# Patient Record
Sex: Male | Born: 1935 | Race: White | Hispanic: No | Marital: Married | Smoking: Former smoker
Health system: Southern US, Community
[De-identification: ages and names within clinical notes are randomized; demographics above are authoritative.]

## PROBLEM LIST (undated history)

## (undated) DIAGNOSIS — K562 Volvulus: Secondary | ICD-10-CM

## (undated) DIAGNOSIS — I4892 Unspecified atrial flutter: Secondary | ICD-10-CM

## (undated) DIAGNOSIS — Z8719 Personal history of other diseases of the digestive system: Secondary | ICD-10-CM

## (undated) DIAGNOSIS — F0390 Unspecified dementia without behavioral disturbance: Secondary | ICD-10-CM

## (undated) DIAGNOSIS — R159 Full incontinence of feces: Secondary | ICD-10-CM

## (undated) DIAGNOSIS — Z9289 Personal history of other medical treatment: Secondary | ICD-10-CM

## (undated) DIAGNOSIS — L409 Psoriasis, unspecified: Secondary | ICD-10-CM

## (undated) DIAGNOSIS — R32 Unspecified urinary incontinence: Secondary | ICD-10-CM

## (undated) DIAGNOSIS — I2510 Atherosclerotic heart disease of native coronary artery without angina pectoris: Secondary | ICD-10-CM

## (undated) DIAGNOSIS — D649 Anemia, unspecified: Secondary | ICD-10-CM

## (undated) HISTORY — DX: Personal history of other medical treatment: Z92.89

## (undated) HISTORY — PX: CARDIAC CATHETERIZATION: SHX172

## (undated) HISTORY — PX: HERNIA REPAIR: SHX51

## (undated) HISTORY — PX: TONSILLECTOMY: SUR1361

---

## 2004-01-15 ENCOUNTER — Encounter: Payer: Self-pay | Admitting: Cardiology

## 2004-01-15 ENCOUNTER — Ambulatory Visit (HOSPITAL_COMMUNITY): Admission: RE | Admit: 2004-01-15 | Discharge: 2004-01-15 | Payer: Self-pay | Admitting: Family Medicine

## 2008-11-01 ENCOUNTER — Emergency Department (HOSPITAL_COMMUNITY): Admission: EM | Admit: 2008-11-01 | Discharge: 2008-11-01 | Payer: Self-pay | Admitting: Emergency Medicine

## 2009-08-22 HISTORY — PX: COLONOSCOPY: SHX174

## 2010-12-02 LAB — BASIC METABOLIC PANEL
BUN: 26 mg/dL — ABNORMAL HIGH (ref 6–23)
Creatinine, Ser: 1.29 mg/dL (ref 0.4–1.5)
GFR calc Af Amer: >60 mL/min (ref >60)
GFR calc non Af Amer: 55 mL/min — ABNORMAL LOW (ref >60)
Potassium: 3.7 mEq/L (ref 3.5–5.1)

## 2010-12-02 LAB — URINALYSIS, ROUTINE W REFLEX MICROSCOPIC
Glucose, UA: NEGATIVE mg/dL
Ketones, ur: 15 mg/dL — AB
Nitrite: NEGATIVE
pH: 6 (ref 5.0–8.0)

## 2010-12-02 LAB — DIFFERENTIAL
Lymphocytes Relative: 7 % — ABNORMAL LOW (ref 12–46)
Lymphs Abs: 0.5 10*3/uL — ABNORMAL LOW (ref 0.7–4.0)
Monocytes Relative: 4 % (ref 3–12)
Neutrophils Relative %: 88 % — ABNORMAL HIGH (ref 43–77)

## 2010-12-02 LAB — CBC
HCT: 35 % — ABNORMAL LOW (ref 39.0–52.0)
Platelets: 172 10*3/uL (ref 150–400)
RBC: 4.03 MIL/uL — ABNORMAL LOW (ref 4.22–5.81)
WBC: 8.4 10*3/uL (ref 4.0–10.5)

## 2010-12-02 LAB — POCT CARDIAC MARKERS
CKMB, poc: 4.9 ng/mL (ref 1.0–8.0)
Myoglobin, poc: 137 ng/mL (ref 12–200)
Troponin i, poc: <0.05 ng/mL (ref 0.00–0.09)

## 2011-01-04 NOTE — Consult Note (Signed)
NAMEYARNELL, [name] Galloway NO.:  [id number]   MEDICAL RECORD NO.:  [id number]          PATIENT TYPE:  EMS   LOCATION:  [facility]                         FACILITY:  [facility]   PHYSICIAN:  Elmore Guise., M.D.DATE OF BIRTH:  06-Dec-1934   DATE OF CONSULTATION:  11/01/2008  DATE OF DISCHARGE:                                 CONSULTATION   INDICATION FOR CONSULT:  Presyncope, patient with history of aortic  valve stenosis and left bundle branch block.   REQUESTING PHYSICIAN:  Carolan Shiver, resident; Dr. Kathlen Brunswick, attending.   HISTORY OF PRESENT ILLNESS:  [name] Galloway is a very pleasant 73-year-  old white male with past medical history of hypertension, aortic valve  stenosis, left bundle branch block who presents to the emergency room  after a presyncopal spell.  The patient reports a normal state of  health.  He states over last couple weeks he has been noticing off-and-  on dry, nonproductive cough.  He denies any problems.  He stays very  active.  Today he was out working and after bending over and then  standing up became very dizzy.  He denied any palpitations.  No chest  pain or shortness of breath.  He did become a little clammy.  He had a  brief episode of nausea and this resolved spontaneously.  The patient  first went to Urgent Care.  There he checked out fine; however, was sent  to the emergency room for further evaluation.  Here he was given 1 L of  normal saline and fell back to normal.  He states he stays very active  with work and at home doing work around the house.  He does not have any  exertional chest pain or shortness of breath.  No palpitations.  He had  a similar episode approximately 1 year ago; at that time it was thought  to be secondary to his hydrochlorothiazide and this medication was  stopped.  Since arriving in the emergency room his blood pressures  ranged from 110-120/60-70 and his heart rates have been in the 60s and  70s showing a left  bundle branch block on the telemetry monitor.  His  cardiac markers were normal.  He is feeling much better and would like  to go home.   REVIEW OF SYSTEMS:  Are as per HPI.  All others are negative.   CURRENT MEDICATIONS:  Are lisinopril and Pravachol.   ALLERGIES:  CIPRO.   FAMILY HISTORY:  Positive for heart disease and cancer with his mother.   SOCIAL HISTORY:  He is married.  Denies any tobacco or alcohol, stays  very active with work and house activities.   PHYSICAL EXAMINATION:  He is afebrile, heart rate 60-70, blood pressure  is 110-120/60-70 satting 95% on room air.  GENERAL:  He is very pleasant white male alert and oriented x4 in no  acute distress.  He does appear younger than stated age.  HEENT:  Normal.  NECK:  Supple, no lymphadenopathy, no JVD, he does have radiation of his  AS murmur to both carotids.  Carotid upstrokes are normal.  No  thyromegaly.  LUNGS:  Clear.  HEART:  Regular with 3/6 systolic ejection murmur, PMI is not displaced.  ABDOMEN:  Soft, nontender, nondistended, no rebound or guarding.  EXTREMITIES:  Warm with 2+ pulses and no edema.  SKIN:  Warm and dry.  Strength is 5/5 and equal bilaterally.  No focal deficits noted.   His blood work shows a sodium of 134, potassium of 3.7, BUN and  creatinine of 26 and 1.29.  White blood cell count of 8.4, hemoglobin of  11.9, platelet count of 172.  Cardiac markers are negative with a  myoglobin of 137, troponin of less than 0.05 and MB of 4.9.  EKG shows  normal sinus rhythm, 65 per minute with left bundle branch block noted.  Chest x-ray shows no acute cardiopulmonary disease.   IMPRESSION:  1. Presyncope, likely secondary to decreased preload and afterload      reduction from his angiotension-converting enzyme inhibitor.  2. History of aortic valve stenosis.  3. History of left bundle branch block.  4. Hypertension.   PLAN:  1. At this time I do think the patient is stable to be discharged       home.  He is feeling totally back to normal.  I have asked him to      increase his fluid intake.  He will stop his lisinopril.  Per his      history, his last echo with Dr. Eldridge Dace was approximately 1 year      ago.  He will need follow-up with Dr. Eldridge Dace on Monday with an      echo done at that time.  If his symptoms return he is to give [place] a      call for further recommendations.  I did discuss if he has      progression of his aortic stenosis, then he may need workup for      valve replacement.      Elmore Guise., M.D.  Electronically Signed     TWK/MEDQ  D:  11/01/2008  T:  11/01/2008  Job:  [id number]   cc:   Corky Crafts, MD

## 2011-09-19 ENCOUNTER — Emergency Department (HOSPITAL_COMMUNITY): Payer: Medicare Other

## 2011-09-19 ENCOUNTER — Inpatient Hospital Stay (HOSPITAL_COMMUNITY)
Admission: EM | Admit: 2011-09-19 | Discharge: 2011-09-26 | DRG: 390 | Disposition: A | Discharge status: Home / Self Care | Payer: Medicare Other | Source: Ambulatory Visit | Attending: Internal Medicine | Admitting: Internal Medicine

## 2011-09-19 ENCOUNTER — Encounter (HOSPITAL_COMMUNITY): Payer: Self-pay | Admitting: Emergency Medicine

## 2011-09-19 DIAGNOSIS — K562 Volvulus: Principal | ICD-10-CM | POA: Diagnosis present

## 2011-09-19 DIAGNOSIS — K639 Disease of intestine, unspecified: Secondary | ICD-10-CM

## 2011-09-19 DIAGNOSIS — Z888 Allergy status to other drugs, medicaments and biological substances status: Secondary | ICD-10-CM

## 2011-09-19 DIAGNOSIS — R112 Nausea with vomiting, unspecified: Secondary | ICD-10-CM | POA: Diagnosis present

## 2011-09-19 DIAGNOSIS — D649 Anemia, unspecified: Secondary | ICD-10-CM | POA: Diagnosis present

## 2011-09-19 DIAGNOSIS — K56609 Unspecified intestinal obstruction, unspecified as to partial versus complete obstruction: Secondary | ICD-10-CM | POA: Diagnosis present

## 2011-09-19 DIAGNOSIS — K5900 Constipation, unspecified: Secondary | ICD-10-CM | POA: Diagnosis present

## 2011-09-19 DIAGNOSIS — I10 Essential (primary) hypertension: Secondary | ICD-10-CM | POA: Diagnosis present

## 2011-09-19 DIAGNOSIS — Z79899 Other long term (current) drug therapy: Secondary | ICD-10-CM

## 2011-09-19 DIAGNOSIS — E876 Hypokalemia: Secondary | ICD-10-CM | POA: Diagnosis present

## 2011-09-19 DIAGNOSIS — I359 Nonrheumatic aortic valve disorder, unspecified: Secondary | ICD-10-CM | POA: Diagnosis present

## 2011-09-19 DIAGNOSIS — I350 Nonrheumatic aortic (valve) stenosis: Secondary | ICD-10-CM

## 2011-09-19 DIAGNOSIS — K449 Diaphragmatic hernia without obstruction or gangrene: Secondary | ICD-10-CM | POA: Diagnosis present

## 2011-09-19 DIAGNOSIS — E785 Hyperlipidemia, unspecified: Secondary | ICD-10-CM | POA: Diagnosis present

## 2011-09-19 DIAGNOSIS — Z809 Family history of malignant neoplasm, unspecified: Secondary | ICD-10-CM

## 2011-09-19 DIAGNOSIS — R911 Solitary pulmonary nodule: Secondary | ICD-10-CM | POA: Diagnosis present

## 2011-09-19 DIAGNOSIS — E860 Dehydration: Secondary | ICD-10-CM | POA: Diagnosis present

## 2011-09-19 DIAGNOSIS — R918 Other nonspecific abnormal finding of lung field: Secondary | ICD-10-CM | POA: Diagnosis present

## 2011-09-19 DIAGNOSIS — Z7982 Long term (current) use of aspirin: Secondary | ICD-10-CM

## 2011-09-19 DIAGNOSIS — Z87891 Personal history of nicotine dependence: Secondary | ICD-10-CM

## 2011-09-19 LAB — CBC
HCT: 34.2 % — ABNORMAL LOW (ref 39.0–52.0)
MCHC: 35.1 g/dL (ref 30.0–36.0)
Platelets: 251 10*3/uL (ref 150–400)
Platelets: 206 10*3/uL (ref 150–400)
RDW: 12.8 % (ref 11.5–15.5)
RDW: 12.9 % (ref 11.5–15.5)
WBC: 9.8 10*3/uL (ref 4.0–10.5)
WBC: 9.1 10*3/uL (ref 4.0–10.5)

## 2011-09-19 LAB — COMPREHENSIVE METABOLIC PANEL
ALT: 24 U/L (ref 0–53)
AST: 28 U/L (ref 0–37)
CO2: 30 mEq/L (ref 19–32)
Calcium: 9.1 mg/dL (ref 8.4–10.5)
Chloride: 95 mEq/L — ABNORMAL LOW (ref 96–112)
GFR calc non Af Amer: 66 mL/min — ABNORMAL LOW (ref >90)
Potassium: 2.2 mEq/L — CL (ref 3.5–5.1)
Sodium: 139 mEq/L (ref 135–145)

## 2011-09-19 LAB — DIFFERENTIAL
Basophils Absolute: 0 10*3/uL (ref 0.0–0.1)
Eosinophils Relative: 0 % (ref 0–5)
Lymphocytes Relative: 8 % — ABNORMAL LOW (ref 12–46)
Neutro Abs: 8.5 10*3/uL — ABNORMAL HIGH (ref 1.7–7.7)
Neutrophils Relative %: 87 % — ABNORMAL HIGH (ref 43–77)

## 2011-09-19 LAB — LACTIC ACID, PLASMA: Lactic Acid, Venous: 2.2 mmol/L (ref 0.5–2.2)

## 2011-09-19 LAB — LIPASE, BLOOD: Lipase: 29 U/L (ref 11–59)

## 2011-09-19 LAB — CREATININE, SERUM
Creatinine, Ser: 1.12 mg/dL (ref 0.50–1.35)
GFR calc Af Amer: 73 mL/min — ABNORMAL LOW (ref >90)
GFR calc non Af Amer: 63 mL/min — ABNORMAL LOW (ref >90)

## 2011-09-19 MED ORDER — POTASSIUM CHLORIDE 10 MEQ/100ML IV SOLN
10.0000 meq | Freq: Once | INTRAVENOUS | Status: AC
  Administered 2011-09-19: 10 meq via INTRAVENOUS
  Filled 2011-09-19: qty 100

## 2011-09-19 MED ORDER — POTASSIUM CHLORIDE 20 MEQ PO PACK: 40.0000 meq | PACK | Freq: Once | ORAL | Status: DC

## 2011-09-19 MED ORDER — POTASSIUM CHLORIDE CRYS ER 20 MEQ PO TBCR
40.0000 meq | EXTENDED_RELEASE_TABLET | Freq: Once | ORAL | Status: DC
  Filled 2011-09-19: qty 2

## 2011-09-19 MED ORDER — LORAZEPAM 2 MG/ML IJ SOLN
0.5000 mg | Freq: Once | INTRAMUSCULAR | Status: AC
  Administered 2011-09-19: 0.5 mg via INTRAVENOUS
  Filled 2011-09-19: qty 1

## 2011-09-19 MED ORDER — SODIUM CHLORIDE 0.9 % IV SOLN
999.0000 mL | Freq: Once | INTRAVENOUS | Status: AC
  Administered 2011-09-19: 999 mL via INTRAVENOUS

## 2011-09-19 MED ORDER — IOHEXOL 300 MG/ML  SOLN
100.0000 mL | Freq: Once | INTRAMUSCULAR | Status: AC | PRN
  Administered 2011-09-19: 100 mL via INTRAVENOUS

## 2011-09-19 MED ORDER — ONDANSETRON HCL 4 MG/2ML IJ SOLN
4.0000 mg | Freq: Once | INTRAMUSCULAR | Status: AC
  Administered 2011-09-19: 4 mg via INTRAVENOUS
  Filled 2011-09-19: qty 2

## 2011-09-19 MED ORDER — KCL IN DEXTROSE-NACL 40-5-0.45 MEQ/L-%-% IV SOLN
INTRAVENOUS | Status: DC
  Administered 2011-09-19 – 2011-09-20 (×2): via INTRAVENOUS
  Filled 2011-09-19 (×5): qty 1000

## 2011-09-19 MED ORDER — POTASSIUM CHLORIDE 10 MEQ/100ML IV SOLN
10.0000 meq | INTRAVENOUS | Status: AC
  Administered 2011-09-19 (×2): 10 meq via INTRAVENOUS
  Filled 2011-09-19 (×2): qty 100

## 2011-09-19 MED ORDER — HYDROMORPHONE HCL PF 1 MG/ML IJ SOLN: 1.0000 mg | INTRAMUSCULAR | Status: DC | PRN

## 2011-09-19 MED ORDER — SODIUM CHLORIDE 0.9 % IJ SOLN
3.0000 mL | Freq: Two times a day (BID) | INTRAMUSCULAR | Status: DC
  Administered 2011-09-20 – 2011-09-26 (×6): 3 mL via INTRAVENOUS

## 2011-09-19 MED ORDER — SODIUM CHLORIDE 0.9 % IV SOLN: INTRAVENOUS | Status: DC

## 2011-09-19 MED ORDER — IOHEXOL 300 MG/ML  SOLN
20.0000 mL | INTRAMUSCULAR | Status: AC
  Administered 2011-09-19: 20 mL via ORAL

## 2011-09-19 MED ORDER — PNEUMOCOCCAL VAC POLYVALENT 25 MCG/0.5ML IJ INJ
0.5000 mL | INJECTION | INTRAMUSCULAR | Status: AC
  Filled 2011-09-19: qty 0.5

## 2011-09-19 MED ORDER — ENOXAPARIN SODIUM 40 MG/0.4ML ~~LOC~~ SOLN
40.0000 mg | SUBCUTANEOUS | Status: DC
  Administered 2011-09-19 – 2011-09-25 (×7): 40 mg via SUBCUTANEOUS
  Filled 2011-09-19 (×9): qty 0.4

## 2011-09-19 NOTE — H&P (Signed)
[name] Galloway is an 77 y.o. male.   Chief Complaint: Abdominal pain, Nausea Vomiting HPI: 77 yo man with history of HTN and Hyperlipidemia presenting with 3 weeks of abdominal pain, nausea and vomiting and one week of progressive Constipation. Has had Colonoscopy last October which was reportedly fine.  His abdominal pain is 10/10, distended, all over and not relieved by anything. Denies any fever, No Melena, no BRBPR. He has tried many laxatives but did not work.  Past Medical History  Diagnosis Date  . High cholesterol   . Hypertension     History reviewed. No pertinent past surgical history.  History reviewed. No pertinent family history. Social History:  reports that he has never smoked. He does not have any smokeless tobacco history on file. He reports that he does not drink alcohol. His drug history not on file.  Allergies:  Allergies  Allergen Reactions  . Ciprofloxacin     Medications Prior to Admission  Medication Dose Route Frequency Provider Last Rate Last Dose  . 0.9 %  sodium chloride infusion  999 mL Intravenous Once Gerhard Munch, MD 250 mL/hr at 09/19/11 1441 999 mL at 09/19/11 1441  . 0.9 %  sodium chloride infusion   Intravenous STAT Geoffery Lyons, MD      . iohexol (OMNIPAQUE) 300 MG/ML solution 100 mL  100 mL Intravenous Once PRN Medication Radiologist, MD   100 mL at 09/19/11 1651  . iohexol (OMNIPAQUE) 300 MG/ML solution 20 mL  20 mL Oral Q1 Hr x 2 Medication Radiologist, MD   20 mL at 09/19/11 1455  . LORazepam (ATIVAN) injection 0.5 mg  0.5 mg Intravenous Once Gerhard Munch, MD   0.5 mg at 09/19/11 1443  . ondansetron (ZOFRAN) injection 4 mg  4 mg Intravenous Once Gerhard Munch, MD   4 mg at 09/19/11 1443  . potassium chloride 10 mEq in 100 mL IVPB  10 mEq Intravenous Q1 Hr x 2 Gerhard Munch, MD   10 mEq at 09/19/11 1800  . potassium chloride 10 mEq in 100 mL IVPB  10 mEq Intravenous Once Grant Fontana, [place]   10 mEq at 09/19/11 1920  . DISCONTD:  potassium chloride (KLOR-CON) packet 40 mEq  40 mEq Oral Once Gerhard Munch, MD      . DISCONTD: potassium chloride SA (K-DUR,KLOR-CON) CR tablet 40 mEq  40 mEq Oral Once Gerhard Munch, MD       No current outpatient prescriptions on file as of 09/19/2011.    Results for orders placed during the hospital encounter of 09/19/11 (from the past 48 hour(s))  CBC     Status: Abnormal   Collection Time   09/19/11  2:16 PM      Component Value Range Comment   WBC 9.8  4.0 - 10.5 (K/uL)    RBC 4.40  4.22 - 5.81 (MIL/uL)    Hemoglobin 12.9 (*) 13.0 - 17.0 (g/dL)    HCT 19.1 (*) 47.8 - 52.0 (%)    MCV 83.2  78.0 - 100.0 (fL)    MCH 29.3  26.0 - 34.0 (pg)    MCHC 35.2  30.0 - 36.0 (g/dL)    RDW 29.5  62.1 - 30.8 (%)    Platelets 251  150 - 400 (K/uL)   DIFFERENTIAL     Status: Abnormal   Collection Time   09/19/11  2:16 PM      Component Value Range Comment   Neutrophils Relative 87 (*) 43 - 77 (%)    Neutro  Abs 8.5 (*) 1.7 - 7.7 (K/uL)    Lymphocytes Relative 8 (*) 12 - 46 (%)    Lymphs Abs 0.7  0.7 - 4.0 (K/uL)    Monocytes Relative 6  3 - 12 (%)    Monocytes Absolute 0.6  0.1 - 1.0 (K/uL)    Eosinophils Relative 0  0 - 5 (%)    Eosinophils Absolute 0.0  0.0 - 0.7 (K/uL)    Basophils Relative 0  0 - 1 (%)    Basophils Absolute 0.0  0.0 - 0.1 (K/uL)   COMPREHENSIVE METABOLIC PANEL     Status: Abnormal   Collection Time   09/19/11  2:16 PM      Component Value Range Comment   Sodium 139  135 - 145 (mEq/L)    Potassium 2.2 (*) 3.5 - 5.1 (mEq/L)    Chloride 95 (*) 96 - 112 (mEq/L)    CO2 30  19 - 32 (mEq/L)    Glucose, Bld 137 (*) 70 - 99 (mg/dL)    BUN 43 (*) 6 - 23 (mg/dL)    Creatinine, Ser 4.09  0.50 - 1.35 (mg/dL)    Calcium 9.1  8.4 - 10.5 (mg/dL)    Total Protein 7.2  6.0 - 8.3 (g/dL)    Albumin 3.9  3.5 - 5.2 (g/dL)    AST 28  0 - 37 (U/L)    ALT 24  0 - 53 (U/L)    Alkaline Phosphatase 53  39 - 117 (U/L)    Total Bilirubin 0.7  0.3 - 1.2 (mg/dL)    GFR calc non Af Amer  66 (*) >90 (mL/min)    GFR calc Af Amer 76 (*) >90 (mL/min)   LIPASE, BLOOD     Status: Normal   Collection Time   09/19/11  2:16 PM      Component Value Range Comment   Lipase 29  11 - 59 (U/L)   LACTIC ACID, PLASMA     Status: Normal   Collection Time   09/19/11  2:16 PM      Component Value Range Comment   Lactic Acid, Venous 2.2  0.5 - 2.2 (mmol/L)    Ct Abdomen Pelvis W Contrast  09/19/2011  *RADIOLOGY REPORT*  Clinical Data: 3-week history of abdominal pain, decreased bowel movements, anorexia, and nausea/vomiting.  CT ABDOMEN AND PELVIS WITH CONTRAST/20 03/2012:  Technique:  Multidetector CT imaging of the abdomen and pelvis was performed following the standard protocol during bolus administration of intravenous contrast.  Contrast: 100 ml Omnipaque 300 IV.  Oral contrast also administered.  Comparison: Acute abdomen series 09/05/2011.  Findings: Interval marked worsening of the gaseous distention of the colon; the colon is now markedly distended to the level of the proximal sigmoid colon where there is an abrupt transition to decompressed colon.  No visible mass involving the sigmoid colon at the transition. Maximal colonic diameter 9.3 cm in the cecum. Large amount of liquid stool throughout the colon.  Inspissated stool in the cecum and ascending colon.  No evidence of small bowel distention.  Moderate sized hiatal hernia.  Thickening involving the wall of the visualized distal esophagus.  Stomach decompressed and otherwise unremarkable.  No free intraperitoneal air.  Beam hardening streak artifact as the patient was unable to raise the arms.  Allowing for this, no focal abnormality involving the liver, spleen, or pancreas.  Gallbladder decompressed and unremarkable by CT.  No biliary ductal dilation. Mild diffuse cortical thinning involving both kidneys which are  otherwise unremarkable.  Normal adrenal glands.  Aorto-iliofemoral atherosclerosis without aneurysm.  Patent visceral arteries.  No  significant lymphadenopathy.  Wide-mouth diverticulum involving the left posterolateral wall of the urinary bladder.  Moderate to severe prostate gland enlargement, particularly the median lobe.  Numerous pelvic phleboliths.  Bone window images demonstrate degenerative changes involving the lower thoracic and lumbar spine.  2 nodules in the visualized right lower lobe, the largest approximating 9 mm (series 3, image 3).  Heart mildly enlarged with left for ventricular predominance and extensive mitral annular and aortic valvular calcification. Three-vessel coronary artery calcification.  IMPRESSION:  1.  Marked gaseous distention of the colon to the level of the proximal sigmoid colon, where there is an abrupt transition to decompressed colon.  No visible mass at the site of transition. 2.  No evidence of small bowel obstruction or free intraperitoneal air.  3.  Moderate sized hiatal hernia.  Thickening of the wall of the visualized distal esophagus suggests GE reflux disease. 4.  Moderate to severe prostate gland enlargement. 5.  Diverticulum arising from the left posterolateral wall of the urinary bladder. 6.  Diffuse cortical thinning involving both kidneys. 7.  2 nodules in the visualized right lower lobe, the largest approximating 9 mm.  Non-emergent CT of the entire chest with contrast is suggested to further evaluation. 8.  Cardiomegaly.  Extensive mitral annular and aortic valvular calcification.  Three-vessel coronary artery calcification.  Original Report Authenticated By: Arnell Sieving, M.D.    Review of Systems  Constitutional: Negative.   HENT: Negative.   Eyes: Negative.   Respiratory: Negative.   Cardiovascular: Negative.   Gastrointestinal: Positive for heartburn, nausea, vomiting, abdominal pain and constipation. Negative for diarrhea, blood in stool and melena.  Genitourinary: Negative.   Musculoskeletal: Negative.   Skin: Negative.   Neurological: Negative.     Endo/Heme/Allergies: Negative.   Psychiatric/Behavioral: Negative.     Blood pressure 129/80, pulse 60, temperature 97.7 F (36.5 C), temperature source Oral, resp. rate 18, SpO2 100.00%. Physical Exam  Constitutional: He is oriented to person, place, and time. He appears well-developed and well-nourished.  HENT:  Head: Normocephalic and atraumatic.  Right Ear: External ear normal.  Left Ear: External ear normal.  Nose: Nose normal.  Mouth/Throat: Oropharynx is clear and moist.  Eyes: Conjunctivae and EOM are normal. Pupils are equal, round, and reactive to light.  Neck: Normal range of motion. Neck supple.  Cardiovascular: Normal rate, regular rhythm, normal heart sounds and intact distal pulses.   Respiratory: Effort normal and breath sounds normal.  GI: He exhibits distension. He exhibits no mass. There is tenderness. There is no rebound and no guarding.  Musculoskeletal: Normal range of motion.  Neurological: He is alert and oriented to person, place, and time. He has normal reflexes.  Skin: Skin is warm and dry.  Psychiatric: He has a normal mood and affect. His behavior is normal. Judgment and thought content normal.     Assessment/Plan 1. Severe Hypokalemia: Probably due to poor intake. Will aggressively replace and check magnesium level. 2. Large Bowel Obstruction: Cause unclear but could be due to profound Hypokalemia, Volvolux, mechanical obstruction etc. [facility] has been consulted and will evaluate for possible decompression. Will replete potassium and keep NPO, hydrate and follow KUBs. 3. Constipation: probably functional obstruction. Address as in 2. 4. Anemia: Check stool guiacs. Follow H/H 5. HTN: Continue home medications    Deniese Oberry,LAWAL 09/19/2011, 7:48 PM

## 2011-09-19 NOTE — ED Notes (Signed)
edp at bedside updating pt on plan of care

## 2011-09-19 NOTE — ED Provider Notes (Signed)
I evaluated patient along with Dr. Jeraldine Loots and Grant Fontana.  The CT showed distended colon and K+ was 2.2.  I spoke with Dr. Matthias Hughs from [facility] who wants patient admitted to medicine.  I spoke with Dr. Mikeal Hawthorne who agrees to admit the patient.    Geoffery Lyons, MD 09/19/11 [id number]

## 2011-09-19 NOTE — ED Notes (Signed)
No bm  X 3 weeks was seen in er 09/04/11 in [place] had xrays then was seen in dr office has been on miralax now cannot eat or drink  abd swollen

## 2011-09-19 NOTE — Consult Note (Signed)
Subjective:   HPI  77 year old male who was admitted with abdominal pain and constipation. and, distention. He states that he has been constipated for two weeks. He has been to urgent care, his PCP and [facility] ER in the past two weeks with this problem. He has not had much BM in the past two weeks and his abdomen has been distended but getting more so lately. He has had some occasional vomiting also. In the ER he was found to be hypokalemic with a high BUN and creatinine suggesting dehydration. A CT of the abdomen shows colonic distention with a cut off at the sigmoid. Patient states that the distention now is about the same as it has been over the past few days. He had a colonoscopy last year and a few years before that    Component Value Date/Time   WBC 9.8 09/19/2011 1416   HGB 12.9* 09/19/2011 1416   HCT 36.6* 09/19/2011 1416   PLT 251 09/19/2011 1416   ALT 24 09/19/2011 1416   AST 28 09/19/2011 1416   NA 139 09/19/2011 1416   K 2.2* 09/19/2011 1416   CL 95* 09/19/2011 1416   CREATININE 1.08 09/19/2011 1416   BUN 43* 09/19/2011 1416   CO2 30 09/19/2011 1416   CALCIUM 9.1 09/19/2011 1416   ALKPHOS 53 09/19/2011 1416      and it was reported that he had a tortuous colon, diverticulosis and internal hemoroids.  Review of Systems No chest pain or dyspnea  Past Medical History  Diagnosis Date  . High cholesterol   . Hypertension    History reviewed. No pertinent past surgical history. History   Social History  . Marital Status: Married    Spouse Name: N/A    Number of Children: N/A  . Years of Education: N/A   Occupational History  . Not on file.   Social History Main Topics  . Smoking status: Never Smoker   . Smokeless tobacco: Not on file  . Alcohol Use: No  . Drug Use:   . Sexually Active:    Other Topics Concern  . Not on file   Social History Narrative  . No narrative on file   family history is not on file. Current facility-administered medications:0.9 %   sodium chloride infusion, 999 mL, Intravenous, Once, Gerhard Munch, MD, Last Rate: 250 mL/hr at 09/19/11 1441, 999 mL at 09/19/11 1441;  0.9 %  sodium chloride infusion, , Intravenous, STAT, Geoffery Lyons, MD;  dextrose 5 % and 0.45 % NaCl with KCl 40 mEq/L infusion, , Intravenous, Continuous, Lonia Blood, MD;  enoxaparin (LOVENOX) injection 40 mg, 40 mg, Subcutaneous, Q24H, Lonia Blood, MD HYDROmorphone (DILAUDID) injection 1 mg, 1 mg, Intravenous, Q4H PRN, Lonia Blood, MD;  iohexol (OMNIPAQUE) 300 MG/ML solution 100 mL, 100 mL, Intravenous, Once PRN, Medication Radiologist, MD, 100 mL at 09/19/11 1651;  iohexol (OMNIPAQUE) 300 MG/ML solution 20 mL, 20 mL, Oral, Q1 Hr x 2, Medication Radiologist, MD, 20 mL at 09/19/11 1455;  LORazepam (ATIVAN) injection 0.5 mg, 0.5 mg, Intravenous, Once, Gerhard Munch, MD, 0.5 mg at 09/19/11 1443 ondansetron [facility]) injection 4 mg, 4 mg, Intravenous, Once, Gerhard Munch, MD, 4 mg at 09/19/11 1443;  potassium chloride 10 mEq in 100 mL IVPB, 10 mEq, Intravenous, Q1 Hr x 2, Gerhard Munch, MD, 10 mEq at 09/19/11 1800;  potassium chloride 10 mEq in 100 mL IVPB, 10 mEq, Intravenous, Once, Grant Fontana, PA, 10 mEq at 09/19/11 1920;  sodium chloride 0.9 %  injection 3 mL, 3 mL, Intravenous, Q12H, Lonia Blood, MD DISCONTD: potassium chloride (KLOR-CON) packet 40 mEq, 40 mEq, Oral, Once, Gerhard Munch, MD;  DISCONTD: potassium chloride SA (K-DUR,KLOR-CON) CR tablet 40 mEq, 40 mEq, Oral, Once, Gerhard Munch, MD Allergies  Allergen Reactions  . Ciprofloxacin      Objective:     BP 129/80  Pulse 60  Temp(Src) 97.7 F (36.5 C) (Oral)  Resp 18  SpO2 100%  General: He is in no distress Heart: RRR Lungs: clear Abdomen: distended, tympanitic, non tender  Laboratory No components found with this basename: d1      Assessment:     Abdominal distention Constipation Colonic obstruction, rule out lesion at sigmoid vs chronic volvulus vs colonic ileus.       Plan:     Correct potassium, hydrate. We will plan a sigmoidoscopy in the morning to further evaluate and decompress. I do not believe this is something that needs to be done emergently tonight.

## 2011-09-19 NOTE — ED Notes (Signed)
Pt sts he was seen at other hospital on 09/05/11 and had 2 enemas and abd xrays with some small results from the enemas. Presents today with a distended abd and some nausea from no BM in 2 weeks. Pt reports some vomiting but not a lot.

## 2011-09-19 NOTE — ED Notes (Signed)
Patient transported to CT 

## 2011-09-19 NOTE — ED Notes (Signed)
Family member states pt is acting confused. Pt presently a&ox4. Awaiting ct scan.

## 2011-09-19 NOTE — ED Provider Notes (Signed)
5:25 PM I assumed care of the patient in the CDU from Dr. Jeraldine Loots, as a shared visit. Pt has had abd pain x 3 weeks; states he has been unable to have a bowel movement for the past week. Labs remarkable for K of 2.2. He does not have a white count and is afebrile with stable VS. On exam, he has generalized abd tenderness with distension. His CT shows marked distension of the large bowel to the level of the sigmoid with no evidence for volvulus or mass at the area of decompression. I discussed this with Dr. Judd Lien. Plan to consult GI with likely medicine admit. Will replete K.  7:44 PM Pt to be admitted to [facility] for further eval and tx.  Grant Fontana, [place] 09/19/11 2036

## 2011-09-19 NOTE — ED Notes (Signed)
Dr. Jeraldine Loots was made aware of patient's low K level=2.2

## 2011-09-19 NOTE — ED Provider Notes (Signed)
History     CSN: [id number]  Arrival date & time 09/19/11  1228   First MD Initiated Contact with Patient 09/19/11 1358      Chief Complaint  Patient presents with  . Abdominal Pain    (Consider location/radiation/quality/duration/timing/severity/associated sxs/prior treatment) HPI This patient presents with 3 weeks of abdominal pain, decreased bowel movements.  He notes that prior to the onset of symptoms he had been in his usual state of health.  No history of abdominal surgery.  His symptoms began gradually, since onset has been persistent, worsening.  No clear provoking factors.  He notes that over the past week in particular he has had no stool production, no flatus.  He notes anorexia, with mild intermittent emesis.  He has been seen in 2 other clinical settings, with prior x-rays, and labs; no specific diagnosis the BuSpar according to the patient and his wife.  He notes that he has been unable to try anything, including MiraLAX, enemas with success. Past Medical History  Diagnosis Date  . High cholesterol   . Hypertension     History reviewed. No pertinent past surgical history.  No family history on file.  History  Substance Use Topics  . Smoking status: Never Smoker   . Smokeless tobacco: Not on file  . Alcohol Use: No      Review of Systems  Constitutional:       Per HPI, otherwise negative  HENT:       Per HPI, otherwise negative  Eyes: Negative.   Respiratory:       Per HPI, otherwise negative  Cardiovascular:       Per HPI, otherwise negative  Gastrointestinal: Positive for vomiting, abdominal pain and constipation. Negative for blood in stool and anal bleeding.  Genitourinary: Negative.   Musculoskeletal:       Per HPI, otherwise negative  Skin: Negative.   Neurological: Negative for syncope.    Allergies  Ciprofloxacin  Home Medications   Current Outpatient Rx  Name Route Sig Dispense Refill  . ASPIRIN 81 MG PO TABS Oral Take 81 mg by mouth  daily.    . OMEGA-3 FATTY ACIDS 1000 MG PO CAPS Oral Take 1 g by mouth daily.    Marland Kitchen LISINOPRIL-HYDROCHLOROTHIAZIDE 10-12.5 MG PO TABS Oral Take 1 tablet by mouth daily.    . LUBIPROSTONE 24 MCG PO CAPS Oral Take 24 mcg by mouth 2 (two) times daily with a meal.    . POLYETHYLENE GLYCOL 3350 PO PACK Oral Take 17 g by mouth daily.      BP 129/80  Pulse 60  Temp(Src) 97.7 F (36.5 C) (Oral)  Resp 18  SpO2 100%  Physical Exam  Nursing note and vitals reviewed. Constitutional: He is oriented to person, place, and time. He appears well-developed. No distress.  HENT:  Head: Normocephalic and atraumatic.  Eyes: Conjunctivae and EOM are normal.  Cardiovascular: Normal rate and regular rhythm.   Pulmonary/Chest: Effort normal. No stridor. No respiratory distress.  Abdominal: He exhibits distension. There is tenderness. There is no guarding.  Musculoskeletal: He exhibits no edema.  Neurological: He is alert and oriented to person, place, and time.  Skin: Skin is warm and dry.  Psychiatric: He has a normal mood and affect.    ED Course  Procedures (including critical care time)  Labs Reviewed  CBC - Abnormal; Notable for the following:    Hemoglobin 12.9 (*)    HCT 36.6 (*)    All other components within normal  limits  DIFFERENTIAL - Abnormal; Notable for the following:    Neutrophils Relative 87 (*)    Neutro Abs 8.5 (*)    Lymphocytes Relative 8 (*)    All other components within normal limits  COMPREHENSIVE METABOLIC PANEL - Abnormal; Notable for the following:    Chloride 95 (*)    Glucose, Bld 137 (*)    BUN 43 (*)    GFR calc non Af Amer 66 (*)    GFR calc Af Amer 76 (*)    All other components within normal limits  LIPASE, BLOOD  LACTIC ACID, PLASMA   No results found.   No diagnosis found.    MDM  This elderly male now presents with 3 weeks of increasing abdominal discomfort, decreased bowel movements, and general fatigue.  On exam the patient is uncomfortable  appearing with a tender, distended abdomen.  The absence of prior abdominal surgeries is somewhat reassuring, but given his description of symptoms, there is concern for obstruction.  The patient's initial evaluation was notable for hypokalemia.  He received supplements, both intravenously, and orally.  He was transferred to the clinical decision unit awaiting his CAT scan       Gerhard Munch, MD 09/19/11 [phone]

## 2011-09-19 NOTE — ED Notes (Signed)
[id number] Ready 

## 2011-09-20 ENCOUNTER — Inpatient Hospital Stay (HOSPITAL_COMMUNITY): Payer: Medicare Other

## 2011-09-20 ENCOUNTER — Encounter (HOSPITAL_COMMUNITY): Payer: Self-pay

## 2011-09-20 ENCOUNTER — Encounter (HOSPITAL_COMMUNITY)
Admission: EM | Disposition: A | Discharge status: Home / Self Care | Payer: Self-pay | Source: Ambulatory Visit | Attending: Internal Medicine

## 2011-09-20 DIAGNOSIS — I350 Nonrheumatic aortic (valve) stenosis: Secondary | ICD-10-CM

## 2011-09-20 DIAGNOSIS — K562 Volvulus: Secondary | ICD-10-CM

## 2011-09-20 HISTORY — PX: FLEXIBLE SIGMOIDOSCOPY: SHX5431

## 2011-09-20 LAB — GLUCOSE, CAPILLARY: Glucose-Capillary: 139 mg/dL — ABNORMAL HIGH (ref 70–99)

## 2011-09-20 LAB — BASIC METABOLIC PANEL
CO2: 31 mEq/L (ref 19–32)
Calcium: 8.4 mg/dL (ref 8.4–10.5)
Chloride: 100 mEq/L (ref 96–112)
GFR calc Af Amer: 71 mL/min — ABNORMAL LOW (ref >90)
Sodium: 140 mEq/L (ref 135–145)

## 2011-09-20 SURGERY — SIGMOIDOSCOPY, FLEXIBLE
Anesthesia: Moderate Sedation

## 2011-09-20 MED ORDER — POLYETHYLENE GLYCOL 3350 17 G PO PACK: 17.0000 g | PACK | Freq: Two times a day (BID) | ORAL | Status: DC

## 2011-09-20 MED ORDER — POLYETHYLENE GLYCOL 3350 17 G PO PACK
17.0000 g | PACK | ORAL | Status: DC
  Administered 2011-09-20 (×2): 17 g via ORAL
  Filled 2011-09-20 (×4): qty 1

## 2011-09-20 MED ORDER — MIDAZOLAM HCL 10 MG/2ML IJ SOLN
INTRAMUSCULAR | Status: AC
  Filled 2011-09-20: qty 2

## 2011-09-20 MED ORDER — MIDAZOLAM HCL 10 MG/2ML IJ SOLN
INTRAMUSCULAR | Status: DC | PRN
  Administered 2011-09-20: 2 mg via INTRAVENOUS

## 2011-09-20 MED ORDER — FENTANYL CITRATE 0.05 MG/ML IJ SOLN
INTRAMUSCULAR | Status: DC | PRN
  Administered 2011-09-20: 25 ug via INTRAVENOUS

## 2011-09-20 MED ORDER — POTASSIUM CHLORIDE CRYS ER 20 MEQ PO TBCR
40.0000 meq | EXTENDED_RELEASE_TABLET | Freq: Once | ORAL | Status: AC
  Administered 2011-09-20: 40 meq via ORAL

## 2011-09-20 MED ORDER — SODIUM CHLORIDE 0.9 % IV SOLN: Freq: Once | INTRAVENOUS | Status: DC

## 2011-09-20 MED ORDER — POTASSIUM CHLORIDE CRYS ER 20 MEQ PO TBCR
EXTENDED_RELEASE_TABLET | ORAL | Status: AC
  Administered 2011-09-20: 40 meq via ORAL
  Filled 2011-09-20: qty 2

## 2011-09-20 MED ORDER — POTASSIUM CHLORIDE CRYS ER 10 MEQ PO TBCR
10.0000 meq | EXTENDED_RELEASE_TABLET | Freq: Two times a day (BID) | ORAL | Status: DC
  Filled 2011-09-20: qty 1

## 2011-09-20 MED ORDER — POTASSIUM CHLORIDE IN NACL 40-0.9 MEQ/L-% IV SOLN
INTRAVENOUS | Status: DC
  Administered 2011-09-20: 17:00:00 via INTRAVENOUS
  Filled 2011-09-20 (×3): qty 1000

## 2011-09-20 MED ORDER — FENTANYL CITRATE 0.05 MG/ML IJ SOLN
INTRAMUSCULAR | Status: AC
  Filled 2011-09-20: qty 2

## 2011-09-20 NOTE — Consult Note (Signed)
Reason for Consult:Sigmoid Volvulus Referring Physician: Dr. Melrose Nakayama [name] Galloway is an 77 y.o. male.  HPI: Patient is a 77 year old gentleman who presents to the ER with abdominal pain and constipation for 2 weeks. He was found to be severely hypokalemic. CT scan shows marked distention and gas with an appropriate incision to decompressed colon. There is no visible mass site of transition no evidence of small bowel obstruction or free intraperitoneal air. He has undergone prior colonoscopy recently for constipation and was told he had some redundant colon. It was hoped that time they would not cause a problem. He had a small hiatal hernia with thickening of the wall of the distal esophagus suggestive of gastroesophageal reflux. Marked prostate enlargement with a diverticulum arising from the posterior lateral wall of the urinary bladder. Cortical thickening of both kidneys and 2 small nodules in the right lower lobe. He also has cardiomegaly an extensive mitral annular and aortic valve calcification with three-vessel artery calcification. He was seen in consultation and underwent a flexible sigmoidoscopy by Dr. Leary Roca. He was found to have a sigmoid volvulus which was decompressed. We were asked to see in consultation.  Past Medical History  Diagnosis Date  . High cholesterol   . Hypertension   Aortic Stenosis, Mitral Regurgitation normal EF 2005  Past Surgical History  Procedure Date  . Colonoscopy 2011    Blockage    History reviewed. No pertinent family history.Father deceased, ETOH, and Brain tumor  Mother: Parkinson and Breast Ca.  2 sisters with cancer one deceased.  Social History:  reports that he quit smoking about 13 years ago. He does not have any smokeless tobacco history on file. He reports that he does not drink alcohol or use illicit drugs. Tobacco 20+ years 1PPD  Etoh:  Social  Drugs: none  Married worked in Psychologist, prison and probation services. Allergies:  Allergies  Allergen  Reactions  . Ciprofloxacin     Medications:  Prior to Admission:  Prescriptions prior to admission  Medication Sig Dispense Refill  . aspirin 81 MG tablet Take 81 mg by mouth daily.      . fish oil-omega-3 fatty acids 1000 MG capsule Take 1 g by mouth daily.      Marland Kitchen lisinopril-hydrochlorothiazide (PRINZIDE,ZESTORETIC) 10-12.5 MG per tablet Take 1 tablet by mouth daily.      Marland Kitchen lubiprostone (AMITIZA) 24 MCG capsule Take 24 mcg by mouth 2 (two) times daily with a meal.      . polyethylene glycol (MIRALAX / GLYCOLAX) packet Take 17 g by mouth daily.       Scheduled:    . sodium chloride   Intravenous STAT  . enoxaparin  40 mg Subcutaneous Q24H  . iohexol  20 mL Oral Q1 Hr x 2  . pneumococcal 23 valent vaccine  0.5 mL Intramuscular Tomorrow-1000  . polyethylene glycol  17 g Oral Q4H  . polyethylene glycol  17 g Oral BID  . potassium chloride  10 mEq Oral BID  . potassium chloride  10 mEq Intravenous Q1 Hr x 2  . potassium chloride  10 mEq Intravenous Once  . potassium chloride  40 mEq Oral Once  . sodium chloride  3 mL Intravenous Q12H  . DISCONTD: sodium chloride   Intravenous Once  . DISCONTD: potassium chloride  40 mEq Oral Once  . DISCONTD: potassium chloride  40 mEq Oral Once   Continuous:    . dextrose 5 % and 0.45 % NaCl with KCl 40 mEq/L 100 mL/hr at 09/20/11  1024   ZOX:[id number], iohexol, DISCONTD: fentaNYL, DISCONTD: midazolam  Results for orders placed during the hospital encounter of 09/19/11 (from the past 48 hour(s))  CBC     Status: Abnormal   Collection Time   09/19/11  2:16 PM      Component Value Range Comment   WBC 9.8  4.0 - 10.5 (K/uL)    RBC 4.40  4.22 - 5.81 (MIL/uL)    Hemoglobin 12.9 (*) 13.0 - 17.0 (g/dL)    HCT 14.7 (*) 82.9 - 52.0 (%)    MCV 83.2  78.0 - 100.0 (fL)    MCH 29.3  26.0 - 34.0 (pg)    MCHC 35.2  30.0 - 36.0 (g/dL)    RDW 56.2  13.0 - 86.5 (%)    Platelets 251  150 - 400 (K/uL)   DIFFERENTIAL     Status: Abnormal   Collection  Time   09/19/11  2:16 PM      Component Value Range Comment   Neutrophils Relative 87 (*) 43 - 77 (%)    Neutro Abs 8.5 (*) 1.7 - 7.7 (K/uL)    Lymphocytes Relative 8 (*) 12 - 46 (%)    Lymphs Abs 0.7  0.7 - 4.0 (K/uL)    Monocytes Relative 6  3 - 12 (%)    Monocytes Absolute 0.6  0.1 - 1.0 (K/uL)    Eosinophils Relative 0  0 - 5 (%)    Eosinophils Absolute 0.0  0.0 - 0.7 (K/uL)    Basophils Relative 0  0 - 1 (%)    Basophils Absolute 0.0  0.0 - 0.1 (K/uL)   COMPREHENSIVE METABOLIC PANEL     Status: Abnormal   Collection Time   09/19/11  2:16 PM      Component Value Range Comment   Sodium 139  135 - 145 (mEq/L)    Potassium 2.2 (*) 3.5 - 5.1 (mEq/L)    Chloride 95 (*) 96 - 112 (mEq/L)    CO2 30  19 - 32 (mEq/L)    Glucose, Bld 137 (*) 70 - 99 (mg/dL)    BUN 43 (*) 6 - 23 (mg/dL)    Creatinine, Ser 7.84  0.50 - 1.35 (mg/dL)    Calcium 9.1  8.4 - 10.5 (mg/dL)    Total Protein 7.2  6.0 - 8.3 (g/dL)    Albumin 3.9  3.5 - 5.2 (g/dL)    AST 28  0 - 37 (U/L)    ALT 24  0 - 53 (U/L)    Alkaline Phosphatase 53  39 - 117 (U/L)    Total Bilirubin 0.7  0.3 - 1.2 (mg/dL)    GFR calc non Af Amer 66 (*) >90 (mL/min)    GFR calc Af Amer 76 (*) >90 (mL/min)   LIPASE, BLOOD     Status: Normal   Collection Time   09/19/11  2:16 PM      Component Value Range Comment   Lipase 29  11 - 59 (U/L)   LACTIC ACID, PLASMA     Status: Normal   Collection Time   09/19/11  2:16 PM      Component Value Range Comment   Lactic Acid, Venous 2.2  0.5 - 2.2 (mmol/L)   CBC     Status: Abnormal   Collection Time   09/19/11 10:18 PM      Component Value Range Comment   WBC 9.1  4.0 - 10.5 (K/uL)    RBC 4.10 (*) 4.22 - 5.81 (  MIL/uL)    Hemoglobin 12.0 (*) 13.0 - 17.0 (g/dL)    HCT 16.1 (*) 09.6 - 52.0 (%)    MCV 83.4  78.0 - 100.0 (fL)    MCH 29.3  26.0 - 34.0 (pg)    MCHC 35.1  30.0 - 36.0 (g/dL)    RDW 04.5  40.9 - 81.1 (%)    Platelets 206  150 - 400 (K/uL)   CREATININE, SERUM     Status: Abnormal    Collection Time   09/19/11 10:18 PM      Component Value Range Comment   Creatinine, Ser 1.12  0.50 - 1.35 (mg/dL)    GFR calc non Af Amer 63 (*) >90 (mL/min)    GFR calc Af Amer 73 (*) >90 (mL/min)   MAGNESIUM     Status: Normal   Collection Time   09/19/11 10:18 PM      Component Value Range Comment   Magnesium 2.4  1.5 - 2.5 (mg/dL)   TSH     Status: Normal   Collection Time   09/19/11 10:18 PM      Component Value Range Comment   TSH 0.890  0.350 - 4.500 (uIU/mL)   GLUCOSE, CAPILLARY     Status: Abnormal   Collection Time   09/20/11  6:29 AM      Component Value Range Comment   Glucose-Capillary 139 (*) 70 - 99 (mg/dL)   BASIC METABOLIC PANEL     Status: Abnormal   Collection Time   09/20/11  9:39 AM      Component Value Range Comment   Sodium 140  135 - 145 (mEq/L)    Potassium 2.5 (*) 3.5 - 5.1 (mEq/L)    Chloride 100  96 - 112 (mEq/L)    CO2 31  19 - 32 (mEq/L)    Glucose, Bld 140 (*) 70 - 99 (mg/dL)    BUN 41 (*) 6 - 23 (mg/dL)    Creatinine, Ser 9.14  0.50 - 1.35 (mg/dL)    Calcium 8.4  8.4 - 10.5 (mg/dL)    GFR calc non Af Amer 61 (*) >90 (mL/min)    GFR calc Af Amer 71 (*) >90 (mL/min)     Abd 1 View (kub)  09/20/2011  *RADIOLOGY REPORT*  Clinical Data: Abdominal distention  ABDOMEN - 1 VIEW  Comparison: Yesterday  Findings: Gaseous distention of small and large bowel has worsened. Small bowel distention is now present.  No obvious free intraperitoneal gas.  IMPRESSION: Diffuse gaseous distention of small and large bowel which is now worse.  Original Report Authenticated By: Donavan Burnet, M.D.   Ct Abdomen Pelvis W Contrast  09/19/2011  *RADIOLOGY REPORT*  Clinical Data: 3-week history of abdominal pain, decreased bowel movements, anorexia, and nausea/vomiting.  CT ABDOMEN AND PELVIS WITH CONTRAST/20 03/2012:  Technique:  Multidetector CT imaging of the abdomen and pelvis was performed following the standard protocol during bolus administration of intravenous  contrast.  Contrast: 100 ml Omnipaque 300 IV.  Oral contrast also administered.  Comparison: Acute abdomen series 09/05/2011.  Findings: Interval marked worsening of the gaseous distention of the colon; the colon is now markedly distended to the level of the proximal sigmoid colon where there is an abrupt transition to decompressed colon.  No visible mass involving the sigmoid colon at the transition. Maximal colonic diameter 9.3 cm in the cecum. Large amount of liquid stool throughout the colon.  Inspissated stool in the cecum and ascending colon.  No evidence of  small bowel distention.  Moderate sized hiatal hernia.  Thickening involving the wall of the visualized distal esophagus.  Stomach decompressed and otherwise unremarkable.  No free intraperitoneal air.  Beam hardening streak artifact as the patient was unable to raise the arms.  Allowing for this, no focal abnormality involving the liver, spleen, or pancreas.  Gallbladder decompressed and unremarkable by CT.  No biliary ductal dilation. Mild diffuse cortical thinning involving both kidneys which are otherwise unremarkable.  Normal adrenal glands.  Aorto-iliofemoral atherosclerosis without aneurysm.  Patent visceral arteries.  No significant lymphadenopathy.  Wide-mouth diverticulum involving the left posterolateral wall of the urinary bladder.  Moderate to severe prostate gland enlargement, particularly the median lobe.  Numerous pelvic phleboliths.  Bone window images demonstrate degenerative changes involving the lower thoracic and lumbar spine.  2 nodules in the visualized right lower lobe, the largest approximating 9 mm (series 3, image 3).  Heart mildly enlarged with left for ventricular predominance and extensive mitral annular and aortic valvular calcification. Three-vessel coronary artery calcification.  IMPRESSION:  1.  Marked gaseous distention of the colon to the level of the proximal sigmoid colon, where there is an abrupt transition to  decompressed colon.  No visible mass at the site of transition. 2.  No evidence of small bowel obstruction or free intraperitoneal air.  3.  Moderate sized hiatal hernia.  Thickening of the wall of the visualized distal esophagus suggests GE reflux disease. 4.  Moderate to severe prostate gland enlargement. 5.  Diverticulum arising from the left posterolateral wall of the urinary bladder. 6.  Diffuse cortical thinning involving both kidneys. 7.  2 nodules in the visualized right lower lobe, the largest approximating 9 mm.  Non-emergent CT of the entire chest with contrast is suggested to further evaluation. 8.  Cardiomegaly.  Extensive mitral annular and aortic valvular calcification.  Three-vessel coronary artery calcification.  Original Report Authenticated By: Arnell Sieving, M.D.    Review of Systems  Constitutional: Negative for fever, chills and diaphoresis.       Sedated after endoscopy. Most of history is from his wife.  Eyes: Eye pain: hasn't had a BM or eaten for 2 weeks.  Respiratory: Negative for cough, hemoptysis, shortness of breath and wheezing.        His wife says he snores and holds his breath when sleeping.  Cardiovascular: Negative.        He has some kind of heart history, that predates his current wife, and he can't really help right now.  Gastrointestinal: Positive for heartburn, nausea, vomiting, abdominal pain, diarrhea and constipation (long history of constipation.). Negative for blood in stool and melena.       Heartburn, nausea and vomiting last 2 weeks with constipation  Genitourinary: Negative.   Musculoskeletal: Negative.   Skin: Negative.   Neurological: Negative.  Negative for weakness. Sensory change: after sigmoidoscopy he had diarrhea in the bed.  Psychiatric/Behavioral: Negative.    Blood pressure 145/72, pulse 68, temperature 98.6 F (37 C), temperature source Oral, resp. rate 18, height 6' (1.829 m), weight 72.8 kg (160 lb 7.9 oz), SpO2  95.00%. Physical Exam  Constitutional: He appears well-developed and well-nourished.       Sedated after Sigmoidoscopy, he will wake up and talk.  Memory poor, but he's happy for  Wife to talk.  HENT:  Head: Normocephalic and atraumatic.  Nose: Nose normal.  Eyes: Conjunctivae and EOM are normal. Pupils are equal, round, and reactive to light. Left eye exhibits no discharge.  No scleral icterus.  Neck: Normal range of motion. Neck supple. No JVD present. No tracheal deviation present. No thyromegaly present.  Cardiovascular: Normal rate, regular rhythm and intact distal pulses.  Exam reveals no gallop and no friction rub (II/VI Mumur loudest at [place], also heard LSB, Base.).   Murmur heard. Respiratory: Effort normal and breath sounds normal. No respiratory distress. He has no wheezes. He has no rales. He exhibits no tenderness.  GI: Soft. He exhibits distension (still distended, but says he's better.). He exhibits no mass. There is no tenderness. There is no rebound and no guarding.  Musculoskeletal: He exhibits no edema and no tenderness.  Lymphadenopathy:    He has no cervical adenopathy.  Neurological:       Still sedated from procedure this.  Most information and hx from his wife.   Skin: Skin is warm and dry. No rash noted. No erythema. No pallor.  Psychiatric:       sedated    Assessment/Plan: 1.  Sigmoid Volvulus 2. Chronic constipation 3. Aortic/Mitral calcification, Aortic stenosis, Mitral Regurgitation on Echo 2005 4.  Hypokalemia 5.Dyslipidemia 6. Hypertension 7.Hx. Tobacco use, 2small nodule up to 9mm RLL. 8.Hiatal Hernia  Plan: Will discuss medical clearance, and watch very closely. He seems improved from earlier today, if he is a recurrence or worsening of his condition he may require surgery during this admission. Agree with DR. Magod's current plan. Will [facility] physician assistant for Dr. Claud Kelp.  [name] Galloway 09/20/2011, 2:43 PM

## 2011-09-20 NOTE — Progress Notes (Signed)
Utilization review completed.  

## 2011-09-20 NOTE — Progress Notes (Signed)
Patient ID: [name] Galloway, male   DOB: 1935/06/05, 77 y.o.   MRN: [id number] Subjective: Patient seen. Complaint  of generalized abdominal pain  Objective: Weight change:   Intake/Output Summary (Last 24 hours) at 09/20/11 1551 Last data filed at 09/20/11 1110  Gross per 24 hour  Intake      0 ml  Output      1 ml  Net     -1 ml   BP 145/72  Pulse 68  Temp(Src) 98.6 F (37 C) (Oral)  Resp 18  Ht 6' (1.829 m)  Wt 72.8 kg (160 lb 7.9 oz)  BMI 21.77 kg/m2  SpO2 95% Physical Exam: General appearance: alert, cooperative and no distress,pallor Head: Normocephalic, without obvious abnormality, atraumatic Neck: no adenopathy, no carotid bruit, no JVD, supple, symmetrical, trachea midline and thyroid not enlarged, symmetric, no tenderness/mass/nodules Lungs: clear to auscultation bilaterally Heart: regular rate and rhythm, S1, S2 normal, no murmur, click, rub or gallop Abdomen: Tense,globally distended; absent bowel sounds Extremities: extremities normal, atraumatic, no cyanosis or edema Skin: Decrease turgor  Lab Results: Results for orders placed during the hospital encounter of 09/19/11 (from the past 48 hour(s))  CBC     Status: Abnormal   Collection Time   09/19/11  2:16 PM      Component Value Range Comment   WBC 9.8  4.0 - 10.5 (K/uL)    RBC 4.40  4.22 - 5.81 (MIL/uL)    Hemoglobin 12.9 (*) 13.0 - 17.0 (g/dL)    HCT 14.7 (*) 82.9 - 52.0 (%)    MCV 83.2  78.0 - 100.0 (fL)    MCH 29.3  26.0 - 34.0 (pg)    MCHC 35.2  30.0 - 36.0 (g/dL)    RDW 56.2  13.0 - 86.5 (%)    Platelets 251  150 - 400 (K/uL)   DIFFERENTIAL     Status: Abnormal   Collection Time   09/19/11  2:16 PM      Component Value Range Comment   Neutrophils Relative 87 (*) 43 - 77 (%)    Neutro Abs 8.5 (*) 1.7 - 7.7 (K/uL)    Lymphocytes Relative 8 (*) 12 - 46 (%)    Lymphs Abs 0.7  0.7 - 4.0 (K/uL)    Monocytes Relative 6  3 - 12 (%)    Monocytes Absolute 0.6  0.1 - 1.0 (K/uL)    Eosinophils Relative 0   0 - 5 (%)    Eosinophils Absolute 0.0  0.0 - 0.7 (K/uL)    Basophils Relative 0  0 - 1 (%)    Basophils Absolute 0.0  0.0 - 0.1 (K/uL)   COMPREHENSIVE METABOLIC PANEL     Status: Abnormal   Collection Time   09/19/11  2:16 PM      Component Value Range Comment   Sodium 139  135 - 145 (mEq/L)    Potassium 2.2 (*) 3.5 - 5.1 (mEq/L)    Chloride 95 (*) 96 - 112 (mEq/L)    CO2 30  19 - 32 (mEq/L)    Glucose, Bld 137 (*) 70 - 99 (mg/dL)    BUN 43 (*) 6 - 23 (mg/dL)    Creatinine, Ser 7.84  0.50 - 1.35 (mg/dL)    Calcium 9.1  8.4 - 10.5 (mg/dL)    Total Protein 7.2  6.0 - 8.3 (g/dL)    Albumin 3.9  3.5 - 5.2 (g/dL)    AST 28  0 - 37 (U/L)    ALT  24  0 - 53 (U/L)    Alkaline Phosphatase 53  39 - 117 (U/L)    Total Bilirubin 0.7  0.3 - 1.2 (mg/dL)    GFR calc non Af Amer 66 (*) >90 (mL/min)    GFR calc Af Amer 76 (*) >90 (mL/min)   LIPASE, BLOOD     Status: Normal   Collection Time   09/19/11  2:16 PM      Component Value Range Comment   Lipase 29  11 - 59 (U/L)   LACTIC ACID, PLASMA     Status: Normal   Collection Time   09/19/11  2:16 PM      Component Value Range Comment   Lactic Acid, Venous 2.2  0.5 - 2.2 (mmol/L)   CBC     Status: Abnormal   Collection Time   09/19/11 10:18 PM      Component Value Range Comment   WBC 9.1  4.0 - 10.5 (K/uL)    RBC 4.10 (*) 4.22 - 5.81 (MIL/uL)    Hemoglobin 12.0 (*) 13.0 - 17.0 (g/dL)    HCT 54.0 (*) 98.1 - 52.0 (%)    MCV 83.4  78.0 - 100.0 (fL)    MCH 29.3  26.0 - 34.0 (pg)    MCHC 35.1  30.0 - 36.0 (g/dL)    RDW 19.1  47.8 - 29.5 (%)    Platelets 206  150 - 400 (K/uL)   CREATININE, SERUM     Status: Abnormal   Collection Time   09/19/11 10:18 PM      Component Value Range Comment   Creatinine, Ser 1.12  0.50 - 1.35 (mg/dL)    GFR calc non Af Amer 63 (*) >90 (mL/min)    GFR calc Af Amer 73 (*) >90 (mL/min)   MAGNESIUM     Status: Normal   Collection Time   09/19/11 10:18 PM      Component Value Range Comment   Magnesium 2.4  1.5 -  2.5 (mg/dL)   TSH     Status: Normal   Collection Time   09/19/11 10:18 PM      Component Value Range Comment   TSH 0.890  0.350 - 4.500 (uIU/mL)   GLUCOSE, CAPILLARY     Status: Abnormal   Collection Time   09/20/11  6:29 AM      Component Value Range Comment   Glucose-Capillary 139 (*) 70 - 99 (mg/dL)   BASIC METABOLIC PANEL     Status: Abnormal   Collection Time   09/20/11  9:39 AM      Component Value Range Comment   Sodium 140  135 - 145 (mEq/L)    Potassium 2.5 (*) 3.5 - 5.1 (mEq/L)    Chloride 100  96 - 112 (mEq/L)    CO2 31  19 - 32 (mEq/L)    Glucose, Bld 140 (*) 70 - 99 (mg/dL)    BUN 41 (*) 6 - 23 (mg/dL)    Creatinine, Ser 6.21  0.50 - 1.35 (mg/dL)    Calcium 8.4  8.4 - 10.5 (mg/dL)    GFR calc non Af Amer 61 (*) >90 (mL/min)    GFR calc Af Amer 71 (*) >90 (mL/min)     Micro Results: No results found for this or any previous visit (from the past 240 hour(s)).  Studies/Results: Abd 1 View (kub)  09/20/2011  *RADIOLOGY REPORT*  Clinical Data: Abdominal distention  ABDOMEN - 1 VIEW  Comparison: Yesterday  Findings: Gaseous distention  of small and large bowel has worsened. Small bowel distention is now present.  No obvious free intraperitoneal gas.  IMPRESSION: Diffuse gaseous distention of small and large bowel which is now worse.  Original Report Authenticated By: Donavan Burnet, M.D.   Ct Abdomen Pelvis W Contrast  09/19/2011  *RADIOLOGY REPORT*  Clinical Data: 3-week history of abdominal pain, decreased bowel movements, anorexia, and nausea/vomiting.  CT ABDOMEN AND PELVIS WITH CONTRAST/20 03/2012:  Technique:  Multidetector CT imaging of the abdomen and pelvis was performed following the standard protocol during bolus administration of intravenous contrast.  Contrast: 100 ml Omnipaque 300 IV.  Oral contrast also administered.  Comparison: Acute abdomen series 09/05/2011.  Findings: Interval marked worsening of the gaseous distention of the colon; the colon is now markedly  distended to the level of the proximal sigmoid colon where there is an abrupt transition to decompressed colon.  No visible mass involving the sigmoid colon at the transition. Maximal colonic diameter 9.3 cm in the cecum. Large amount of liquid stool throughout the colon.  Inspissated stool in the cecum and ascending colon.  No evidence of small bowel distention.  Moderate sized hiatal hernia.  Thickening involving the wall of the visualized distal esophagus.  Stomach decompressed and otherwise unremarkable.  No free intraperitoneal air.  Beam hardening streak artifact as the patient was unable to raise the arms.  Allowing for this, no focal abnormality involving the liver, spleen, or pancreas.  Gallbladder decompressed and unremarkable by CT.  No biliary ductal dilation. Mild diffuse cortical thinning involving both kidneys which are otherwise unremarkable.  Normal adrenal glands.  Aorto-iliofemoral atherosclerosis without aneurysm.  Patent visceral arteries.  No significant lymphadenopathy.  Wide-mouth diverticulum involving the left posterolateral wall of the urinary bladder.  Moderate to severe prostate gland enlargement, particularly the median lobe.  Numerous pelvic phleboliths.  Bone window images demonstrate degenerative changes involving the lower thoracic and lumbar spine.  2 nodules in the visualized right lower lobe, the largest approximating 9 mm (series 3, image 3).  Heart mildly enlarged with left for ventricular predominance and extensive mitral annular and aortic valvular calcification. Three-vessel coronary artery calcification.  IMPRESSION:  1.  Marked gaseous distention of the colon to the level of the proximal sigmoid colon, where there is an abrupt transition to decompressed colon.  No visible mass at the site of transition. 2.  No evidence of small bowel obstruction or free intraperitoneal air.  3.  Moderate sized hiatal hernia.  Thickening of the wall of the visualized distal esophagus  suggests GE reflux disease. 4.  Moderate to severe prostate gland enlargement. 5.  Diverticulum arising from the left posterolateral wall of the urinary bladder. 6.  Diffuse cortical thinning involving both kidneys. 7.  2 nodules in the visualized right lower lobe, the largest approximating 9 mm.  Non-emergent CT of the entire chest with contrast is suggested to further evaluation. 8.  Cardiomegaly.  Extensive mitral annular and aortic valvular calcification.  Three-vessel coronary artery calcification.  Original Report Authenticated By: Arnell Sieving, M.D.   Medications: Scheduled Meds:   . sodium chloride   Intravenous STAT  . enoxaparin  40 mg Subcutaneous Q24H  . iohexol  20 mL Oral Q1 Hr x 2  . pneumococcal 23 valent vaccine  0.5 mL Intramuscular Tomorrow-1000  . polyethylene glycol  17 g Oral Q4H  . polyethylene glycol  17 g Oral BID  . potassium chloride  10 mEq Oral BID  . potassium chloride  10  mEq Intravenous Q1 Hr x 2  . potassium chloride  10 mEq Intravenous Once  . potassium chloride  40 mEq Oral Once  . sodium chloride  3 mL Intravenous Q12H  . DISCONTD: sodium chloride   Intravenous Once  . DISCONTD: potassium chloride  40 mEq Oral Once  . DISCONTD: potassium chloride  40 mEq Oral Once   Continuous Infusions:   . 0.9 % NaCl with KCl 40 mEq / L    . DISCONTD: dextrose 5 % and 0.45 % NaCl with KCl 40 mEq/L 100 mL/hr at 09/20/11 1024   PRN Meds:.HYDROmorphone, iohexol, DISCONTD: fentaNYL, DISCONTD: midazolam  Assessment/Plan: #1 bowel obstruction- n.p.o. start. Insert NG tube and start continuous suctioning. #2 dehydration-change fluid to normal saline  #3 hypokalemia-potassium repletion  #4 anemia-monitor H&H   #5 hypertension-blood pressure  stable   #6 history of Lung nodules  #7 history of Aortic stenosis   LOS: 1 day   Dellia Donnelly 09/20/2011, 3:51 PM

## 2011-09-20 NOTE — Op Note (Signed)
[facility] [address] [place], [place]  [address]  FLEXIBLE SIGMOIDOSCOPY PROCEDURE REPORT  PATIENT:  [name] Galloway, [name] Galloway  MR#:  [id number] BIRTHDATE:  1934/11/19, 76 yrs. old  GENDER:  male  ENDOSCOPIST:  Vida Rigger, MD Referred by:  PROCEDURE DATE:  09/20/2011 PROCEDURE:  Flexible Sigmoidoscopy for decompression of volvulus ASA CLASS:  Class III INDICATIONS:  abnormal imaging probable volvulus  MEDICATIONS:  25 mcg fentanyl 2 mg Versed DESCRIPTION OF PROCEDURE:   After the risks benefits and alternatives of the procedure were thoroughly explained, informed consent was obtained.  Digital rectal exam was performed and revealed no abnormalities.   The pentax [id number] and [id number]) endoscope was introduced through the anus and advanced to the descending colon, limited by poor preparation.   The quality of the prep was Unprepped.  The instrument was then slowly withdrawn as the mucosa was fully examined. An obvious volvulus was seen in the distal sigmoid probably at the rectal sigmoid junction we advanced through a normal dilated segment and air was suctioned past the second twist into the descending colon which was dilated but no signs of ischemia was seen and air was suctioned unfortunately formed stool was encountered and although we did lots of watching and suctioning we clogged the scope a few time and we elected to withdrawl air stool and water were suctioned on withdrawal. Once back in the rectum we repeated the above in an effort to suction more air but due to increased stool in the sigmoid now we could not advance very far air was suctioned scope removed patient tolerated the procedure well there was no obvious immediate complication <<PROCEDUREIMAGES>>  A volvulus was found in the sigmoid colon.   Retroflexed views in the rectum revealed not performed.    The scope was then withdrawn from the patient and the procedure  terminated.  COMPLICATIONS:  None ENDOSCOPIC IMPRESSION: 1. Obvious volvulus. Very Distal sigmoid status post decompression 1) Volvulus in the sigmoid colon 2) retroflexion Not performed RECOMMENDATIONS: 1) surgery consult 2. Replete K. 3. Clear liquids and MiraLAX  REPEAT EXAM:  As needed  ______________________________ Vida Rigger, MD  CC:  n. eSIGNEDVida Rigger at 09/20/2011 09:35 AM  [place], [place], [phone]

## 2011-09-20 NOTE — Consult Note (Signed)
The patient is seen, interviewed, and examined. I agree with Mr. Marlyne Beards none.  I have discussed the patient's case with Dr. Sandrea Hammond, including the colonoscopic findings.  The patient feels better and says he is less distended. He is still distended from small bowel distention, but is nontender.  His volvulus seems to have been resolved radiographically.  Assessment: Acute large bowel obstruction secondary to sigmoid volvulus, it appears that this has been decompressed and the obstruction is partially relieved. Currently there is no clinical evidence for compromised bowel.  I agree with MiraLAX and clear liquid diet in an attempt to resolve his obstruction. Hopefully his abdominal distention will resolve with the bowel prep as outlined by Dr. Ewing Schlein.  If he re-obstructs from his volvulus, consideration should be given to sigmoid colectomy during this admission.  Will follow.   Angelia Mould. Derrell Lolling, M.D., [facility] General and Minimally invasive Surgery Breast and Colorectal Surgery Office:   [phone] Pager:   [phone]

## 2011-09-20 NOTE — Progress Notes (Signed)
NG tube was placed and verified by 2 nurses. Pt tolerated procedure well given the circumstances. Initially 400cc of dark liquid were suctioned out. Pt has no complaints at this time. Will continue to monitor patient closely. Ramond Craver, RN

## 2011-09-20 NOTE — Progress Notes (Signed)
CRITICAL VALUE ALERT  Critical value received:  K 2.5  Date of notification: 09/20/2011  Time of notification:  1030   Critical value read back:yes  Nurse who received alert:  Ramond Craver, RN  MD notified (1st page):  DR. Berdine Addison  Time of first page:  1031  MD notified (2nd page):  Time of second page:  Responding MD:  Dr, Berdine Addison  Time MD responded: [phone]

## 2011-09-21 ENCOUNTER — Inpatient Hospital Stay (HOSPITAL_COMMUNITY): Payer: Medicare Other

## 2011-09-21 LAB — COMPREHENSIVE METABOLIC PANEL
ALT: 19 U/L (ref 0–53)
BUN: 38 mg/dL — ABNORMAL HIGH (ref 6–23)
CO2: 31 mEq/L (ref 19–32)
Calcium: 8.5 mg/dL (ref 8.4–10.5)
Creatinine, Ser: 1.05 mg/dL (ref 0.50–1.35)
GFR calc Af Amer: 79 mL/min — ABNORMAL LOW (ref >90)
GFR calc non Af Amer: 68 mL/min — ABNORMAL LOW (ref >90)
Glucose, Bld: 108 mg/dL — ABNORMAL HIGH (ref 70–99)

## 2011-09-21 LAB — BASIC METABOLIC PANEL
Chloride: 103 mEq/L (ref 96–112)
Creatinine, Ser: 1.04 mg/dL (ref 0.50–1.35)
Potassium: 2.5 mEq/L — CL (ref 3.5–5.1)

## 2011-09-21 LAB — CBC
MCV: 86.7 fL (ref 78.0–100.0)
Platelets: 260 10*3/uL (ref 150–400)
RBC: 4.67 MIL/uL (ref 4.22–5.81)
RDW: 13.2 % (ref 11.5–15.5)
WBC: 10.4 10*3/uL (ref 4.0–10.5)

## 2011-09-21 MED ORDER — POTASSIUM CHLORIDE 10 MEQ/100ML IV SOLN
10.0000 meq | INTRAVENOUS | Status: AC
  Administered 2011-09-21 (×4): 10 meq via INTRAVENOUS
  Filled 2011-09-21 (×4): qty 100

## 2011-09-21 MED ORDER — DEXTROSE-NACL 5-0.9 % IV SOLN: INTRAVENOUS | Status: DC

## 2011-09-21 MED ORDER — POLYETHYLENE GLYCOL 3350 17 G PO PACK
17.0000 g | PACK | Freq: Two times a day (BID) | ORAL | Status: DC
  Administered 2011-09-21 – 2011-09-24 (×5): 17 g via ORAL
  Filled 2011-09-21 (×11): qty 1

## 2011-09-21 MED ORDER — POTASSIUM CHLORIDE CRYS ER 20 MEQ PO TBCR
40.0000 meq | EXTENDED_RELEASE_TABLET | Freq: Once | ORAL | Status: AC
  Administered 2011-09-21: 40 meq via ORAL
  Filled 2011-09-21: qty 2

## 2011-09-21 MED ORDER — POTASSIUM CHLORIDE 10 MEQ/100ML IV SOLN
10.0000 meq | INTRAVENOUS | Status: DC
  Filled 2011-09-21 (×3): qty 100

## 2011-09-21 MED ORDER — POTASSIUM CHLORIDE 2 MEQ/ML IV SOLN
INTRAVENOUS | Status: DC
  Filled 2011-09-21 (×2): qty 1000

## 2011-09-21 MED ORDER — POTASSIUM CHLORIDE 10 MEQ/100ML IV SOLN
10.0000 meq | INTRAVENOUS | Status: AC
  Administered 2011-09-21 (×3): 10 meq via INTRAVENOUS
  Filled 2011-09-21 (×3): qty 100

## 2011-09-21 MED ORDER — KCL IN DEXTROSE-NACL 20-5-0.9 MEQ/L-%-% IV SOLN
INTRAVENOUS | Status: DC
  Administered 2011-09-21 – 2011-09-24 (×5): via INTRAVENOUS
  Filled 2011-09-21 (×7): qty 1000

## 2011-09-21 NOTE — Progress Notes (Signed)
Dr. Ewing Schlein stated he wanted patient to have sips and chips the order placed was for clear liquids. Pt received clear liquid tray from cafeteria and tolerated it well. Dr. Ewing Schlein notified no new orders received. Will continue to monitor patient.

## 2011-09-21 NOTE — Progress Notes (Signed)
Subjective: Patient relates feeling better. Abdominal pain once in a while. Had BM earlier this morning. Per nurse was last night. 400cc out of NG tube.  Objective: Filed Vitals:   09/20/11 0950 09/20/11 1300 09/20/11 2100 09/21/11 0500  BP: 135/76 145/72 156/74 135/60  Pulse:  68 74 58  Temp:  98.6 F (37 C) 98.3 F (36.8 C) 97.7 F (36.5 C)  TempSrc:  Oral Oral Oral  Resp: 22 18 16 16   Height:      Weight:      SpO2: 93% 95% 97% 98%   Weight change:   Intake/Output Summary (Last 24 hours) at 09/21/11 0758 Last data filed at 09/21/11 0700  Gross per 24 hour  Intake 1423.33 ml  Output    554 ml  Net 869.33 ml    General: Alert, awake, oriented x3, in no acute distress.  HEENT: No bruits, no goiter.  Heart: Regular rate and rhythm, without murmurs, rubs, gallops.  Lungs: CTA, bilateral air movement.  Abdomen: Soft, mildtender, mild distended, positive bowel sounds.  Neuro: Grossly intact, nonfocal. Extremities: no edema.    Lab Results:  Basename 09/21/11 0535 09/20/11 0939 09/19/11 2218  NA 144 140 --  K 2.8* 2.5* --  CL 105 100 --  CO2 31 31 --  GLUCOSE 108* 140* --  BUN 38* 41* --  CREATININE 1.05 1.15 --  CALCIUM 8.5 8.4 --  MG 2.4 -- 2.4  PHOS -- -- --    Basename 09/21/11 0535 09/19/11 1416  AST 22 28  ALT 19 24  ALKPHOS 47 53  BILITOT 0.8 0.7  PROT 6.1 7.2  ALBUMIN 3.3* 3.9    Basename 09/19/11 1416  LIPASE 29  AMYLASE --    Basename 09/21/11 0535 09/19/11 2218 09/19/11 1416  WBC 10.4 9.1 --  NEUTROABS -- -- 8.5*  HGB 13.6 12.0* --  HCT 40.5 34.2* --  MCV 86.7 83.4 --  PLT 260 206 --    Basename 09/19/11 2218  TSH 0.890  T4TOTAL --  T3FREE --  THYROIDAB --   Studies/Results: Abd 1 View (kub)  09/20/2011  *RADIOLOGY REPORT*  Clinical Data: Abdominal distention  ABDOMEN - 1 VIEW  Comparison: Yesterday  Findings: Gaseous distention of small and large bowel has worsened. Small bowel distention is now present.  No obvious free  intraperitoneal gas.  IMPRESSION: Diffuse gaseous distention of small and large bowel which is now worse.  Original Report Authenticated By: Donavan Burnet, M.D.   Ct Abdomen Pelvis W Contrast  09/19/2011  *RADIOLOGY REPORT*  Clinical Data: 3-week history of abdominal pain, decreased bowel movements, anorexia, and nausea/vomiting.  CT ABDOMEN AND PELVIS WITH CONTRAST/20 03/2012:  Technique:  Multidetector CT imaging of the abdomen and pelvis was performed following the standard protocol during bolus administration of intravenous contrast.  Contrast: 100 ml Omnipaque 300 IV.  Oral contrast also administered.  Comparison: Acute abdomen series 09/05/2011.  Findings: Interval marked worsening of the gaseous distention of the colon; the colon is now markedly distended to the level of the proximal sigmoid colon where there is an abrupt transition to decompressed colon.  No visible mass involving the sigmoid colon at the transition. Maximal colonic diameter 9.3 cm in the cecum. Large amount of liquid stool throughout the colon.  Inspissated stool in the cecum and ascending colon.  No evidence of small bowel distention.  Moderate sized hiatal hernia.  Thickening involving the wall of the visualized distal esophagus.  Stomach decompressed and otherwise unremarkable.  No free intraperitoneal air.  Beam hardening streak artifact as the patient was unable to raise the arms.  Allowing for this, no focal abnormality involving the liver, spleen, or pancreas.  Gallbladder decompressed and unremarkable by CT.  No biliary ductal dilation. Mild diffuse cortical thinning involving both kidneys which are otherwise unremarkable.  Normal adrenal glands.  Aorto-iliofemoral atherosclerosis without aneurysm.  Patent visceral arteries.  No significant lymphadenopathy.  Wide-mouth diverticulum involving the left posterolateral wall of the urinary bladder.  Moderate to severe prostate gland enlargement, particularly the median lobe.   Numerous pelvic phleboliths.  Bone window images demonstrate degenerative changes involving the lower thoracic and lumbar spine.  2 nodules in the visualized right lower lobe, the largest approximating 9 mm (series 3, image 3).  Heart mildly enlarged with left for ventricular predominance and extensive mitral annular and aortic valvular calcification. Three-vessel coronary artery calcification.  IMPRESSION:  1.  Marked gaseous distention of the colon to the level of the proximal sigmoid colon, where there is an abrupt transition to decompressed colon.  No visible mass at the site of transition. 2.  No evidence of small bowel obstruction or free intraperitoneal air.  3.  Moderate sized hiatal hernia.  Thickening of the wall of the visualized distal esophagus suggests GE reflux disease. 4.  Moderate to severe prostate gland enlargement. 5.  Diverticulum arising from the left posterolateral wall of the urinary bladder. 6.  Diffuse cortical thinning involving both kidneys. 7.  2 nodules in the visualized right lower lobe, the largest approximating 9 mm.  Non-emergent CT of the entire chest with contrast is suggested to further evaluation. 8.  Cardiomegaly.  Extensive mitral annular and aortic valvular calcification.  Three-vessel coronary artery calcification.  Original Report Authenticated By: Arnell Sieving, M.D.    Medications: I have reviewed the patient's current medications.   Patient Active Hospital Problem List:  Hypokalemia (09/19/2011) I will replete with Kcl 10 meq times 4 runs. Repeat Bmet this afternoon. Continue with KCL in IV fluids. Mg was normal.   Large bowel obstruction secondary to Sigmoid Volvulus:  KUB pending. Continue with NPO, IV fluids. NG tube was place yesterday. Surgery following.      HTN (hypertension) (09/19/2011) Monitor.  Lung nodules (09/19/2011) Needs Ct chest to follow up lung nodule Aortic stenosis (09/20/2011)      LOS: 2 days   Lynford Espinoza  M.D.  [facility] 09/21/2011, 7:58 AM

## 2011-09-21 NOTE — Progress Notes (Signed)
[name] Galloway 9:14 AM  Subjective: The patient is doing much better and moving his bowels in his mouth is dry and he wants something to drink and he only has minimal crampy discomfort and we rediscussed his volvulus Objective: Vital signs stable afebrile abdomen is soft positive bowel some no obvious tenderness potassium still low white count okay and x-ray improved  Assessment: Volvulus improved  Plan: Clamping NG allow sips and chips increased K. continue MiraLAX and I have discussed the above with the nurse and will try to order appropriately [facility] E

## 2011-09-21 NOTE — Progress Notes (Signed)
I agree with Ms. Osborne's note. Please see my note earlier today.   Yeiden Frenkel M 09/21/2011 2:28 PM

## 2011-09-21 NOTE — Progress Notes (Signed)
   CARE MANAGEMENT NOTE 09/21/2011  Patient:  NECO, KLING   Account Number:  [id number]  Date Initiated:  09/21/2011  Documentation initiated by:  Letha Cape  Subjective/Objective Assessment:   dx hypokalemia  admit- lives with spouse.     Action/Plan:   sbo--tolerating clears with ng clamped   Anticipated DC Date:  09/26/2011   Anticipated DC Plan:  HOME/SELF CARE      DC Planning Services  CM consult      Choice offered to / List presented to:             Status of service:  In process, will continue to follow Medicare Important Message given?   (If response is "NO", the following Medicare IM given date fields will be blank) Date Medicare IM given:   Date Additional Medicare IM given:    Discharge Disposition:    Per UR Regulation:    Comments:  09/21/11 16:16 Letha Cape RN, BSN [phone] Patient with sbo, ng clamped, tolerating clears, lives with spouse at home.

## 2011-09-21 NOTE — Progress Notes (Signed)
Patient ID: Anselm Aumiller, male   DOB: 1935-01-07, 77 y.o.   MRN: [id number]  Subjective: No nausea. No pain. Passing stool and flatus. NG clamped and starting on clear liquids. States distention significantly improved.  Objective: Alert and stable. Abdomen softer and much less distended. BS present. No mass. K=  2.8  WBC 10.4  Assessment: Sigmoid volvulus, obstruction resolving s/p colonoscopic decompression.  Plan: Agree with bowel regimen and diet as outlined by Dr. Ewing Schlein. May or may not need resection with this first episode. Agree with aggressively repleting K+. Will follow   Angelia Mould. Derrell Lolling, M.D., [facility] General and Minimally invasive Surgery Breast and Colorectal Surgery Office:   [phone] Pager:   [phone]

## 2011-09-21 NOTE — Progress Notes (Signed)
Patient ID: [name] Galloway, male   DOB: [birth year], 77 y.o.   MRN: [id number] 1 Day Post-Op  Subjective: Pt feels better.  Passing stool.  Tolerating clears with NG clamped  Objective: Vital signs in last 24 hours: Temp:  [97.7 F (36.5 C)-98.6 F (37 C)] 97.7 F (36.5 C) (01/30 0500) Pulse Rate:  [58-74] 58  (01/30 0500) Resp:  [16-18] 16  (01/30 0500) BP: (135-156)/(60-74) 135/60 mmHg (01/30 0500) SpO2:  [95 %-98 %] 98 % (01/30 0500) Last BM Date: 09/20/11  Intake/Output from previous day: 01/29 0701 - 01/30 0700 In: 1423.3 [I.V.:1423.3] Out: 554 [Emesis/NG output:550; Stool:4] Intake/Output this shift:    PE: Abd: soft, mild distention, +BS, NT Heart: regular Lungs: cTAB  Lab Results:   [facility] 09/21/11 0535 09/19/11 2218  WBC 10.4 9.1  HGB 13.6 12.0*  HCT 40.5 34.2*  PLT 260 206   BMET  Basename 09/21/11 0535 09/20/11 0939  NA 144 140  K 2.8* 2.5*  CL 105 100  CO2 31 31  GLUCOSE 108* 140*  BUN 38* 41*  CREATININE 1.05 1.15  CALCIUM 8.5 8.4   PT/INR No results found for this basename: LABPROT:2,INR:2 in the last 72 hours   Studies/Results: Abd 1 View (kub)  09/20/2011  *RADIOLOGY REPORT*  Clinical Data: Abdominal distention  ABDOMEN - 1 VIEW  Comparison: Yesterday  Findings: Gaseous distention of small and large bowel has worsened. Small bowel distention is now present.  No obvious free intraperitoneal gas.  IMPRESSION: Diffuse gaseous distention of small and large bowel which is now worse.  Original Report Authenticated By: Donavan Burnet, M.D.   Ct Abdomen Pelvis W Contrast  09/19/2011  *RADIOLOGY REPORT*  Clinical Data: 3-week history of abdominal pain, decreased bowel movements, anorexia, and nausea/vomiting.  CT ABDOMEN AND PELVIS WITH CONTRAST/20 03/2012:  Technique:  Multidetector CT imaging of the abdomen and pelvis was performed following the standard protocol during bolus administration of intravenous contrast.  Contrast: 100 ml Omnipaque 300  IV.  Oral contrast also administered.  Comparison: Acute abdomen series 09/05/2011.  Findings: Interval marked worsening of the gaseous distention of the colon; the colon is now markedly distended to the level of the proximal sigmoid colon where there is an abrupt transition to decompressed colon.  No visible mass involving the sigmoid colon at the transition. Maximal colonic diameter 9.3 cm in the cecum. Large amount of liquid stool throughout the colon.  Inspissated stool in the cecum and ascending colon.  No evidence of small bowel distention.  Moderate sized hiatal hernia.  Thickening involving the wall of the visualized distal esophagus.  Stomach decompressed and otherwise unremarkable.  No free intraperitoneal air.  Beam hardening streak artifact as the patient was unable to raise the arms.  Allowing for this, no focal abnormality involving the liver, spleen, or pancreas.  Gallbladder decompressed and unremarkable by CT.  No biliary ductal dilation. Mild diffuse cortical thinning involving both kidneys which are otherwise unremarkable.  Normal adrenal glands.  Aorto-iliofemoral atherosclerosis without aneurysm.  Patent visceral arteries.  No significant lymphadenopathy.  Wide-mouth diverticulum involving the left posterolateral wall of the urinary bladder.  Moderate to severe prostate gland enlargement, particularly the median lobe.  Numerous pelvic phleboliths.  Bone window images demonstrate degenerative changes involving the lower thoracic and lumbar spine.  2 nodules in the visualized right lower lobe, the largest approximating 9 mm (series 3, image 3).  Heart mildly enlarged with left for ventricular predominance and extensive mitral annular and aortic valvular calcification.  Three-vessel coronary artery calcification.  IMPRESSION:  1.  Marked gaseous distention of the colon to the level of the proximal sigmoid colon, where there is an abrupt transition to decompressed colon.  No visible mass at the site  of transition. 2.  No evidence of small bowel obstruction or free intraperitoneal air.  3.  Moderate sized hiatal hernia.  Thickening of the wall of the visualized distal esophagus suggests GE reflux disease. 4.  Moderate to severe prostate gland enlargement. 5.  Diverticulum arising from the left posterolateral wall of the urinary bladder. 6.  Diffuse cortical thinning involving both kidneys. 7.  2 nodules in the visualized right lower lobe, the largest approximating 9 mm.  Non-emergent CT of the entire chest with contrast is suggested to further evaluation. 8.  Cardiomegaly.  Extensive mitral annular and aortic valvular calcification.  Three-vessel coronary artery calcification.  Original Report Authenticated By: Arnell Sieving, M.D.   Dg Abd 2 Views  09/21/2011  *RADIOLOGY REPORT*  Clinical Data: Decreased abdominal distension.  Diarrhea. Volvulus.  ABDOMEN - 2 VIEW  Comparison: 09/20/2011 plain film exam.  09/19/2011 CT.  Findings: Gas distended bowel loops with decrease in the degree of gas distended small bowel loops but with persistent colonic distension.  Sigmoid colon which projects superiorly measures up to 9.8 cm.  No plain film evidence of pneumatosis or free intraperitoneal air.  Nasogastric tube side hole beyond the gastroesophageal junction with the tip at the level of the proximal gastric body.  IMPRESSION: Decreased gas distended small bowel loops with persistent abnormal distension of the colon as detailed above.  Original Report Authenticated By: Fuller Canada, M.D.    Anti-infectives: Anti-infectives    None       Assessment/Plan  1. Sigmoid volvulus  Plan: 1. Agree with plan of clamping NGT and giving his sips and chips.  Patient's NGT could probably be removed as he is passing multiple BMs. 2. He is improving.  Hopefully he can avoid an operation; however, if he retorses, he would likely need an operation. 3. Will follow   LOS: 2 days    Drue Camera  E 09/21/2011

## 2011-09-22 LAB — CBC
Hemoglobin: 11.4 g/dL — ABNORMAL LOW (ref 13.0–17.0)
MCH: 29.5 pg (ref 26.0–34.0)
MCHC: 34.5 g/dL (ref 30.0–36.0)
MCV: 85.3 fL (ref 78.0–100.0)
Platelets: 186 10*3/uL (ref 150–400)
RBC: 3.87 MIL/uL — ABNORMAL LOW (ref 4.22–5.81)

## 2011-09-22 LAB — BASIC METABOLIC PANEL
CO2: 28 mEq/L (ref 19–32)
Calcium: 7.6 mg/dL — ABNORMAL LOW (ref 8.4–10.5)
Creatinine, Ser: 1.05 mg/dL (ref 0.50–1.35)
GFR calc non Af Amer: 68 mL/min — ABNORMAL LOW (ref >90)
Glucose, Bld: 104 mg/dL — ABNORMAL HIGH (ref 70–99)
Sodium: 139 mEq/L (ref 135–145)

## 2011-09-22 MED ORDER — POTASSIUM CHLORIDE CRYS ER 20 MEQ PO TBCR
40.0000 meq | EXTENDED_RELEASE_TABLET | Freq: Three times a day (TID) | ORAL | Status: AC
  Administered 2011-09-22 (×3): 40 meq via ORAL
  Filled 2011-09-22 (×3): qty 2

## 2011-09-22 NOTE — Progress Notes (Signed)
Subjective: Patient relates feeling better. No abdominal pain, distension going down. Had 1 BM today.  Objective: Filed Vitals:   09/20/11 2100 10/21/2011 0500 Oct 21, 2011 1331 2011/10/21 2100  BP: 156/74 135/60 133/66 138/76  Pulse: 74 58 60 45  Temp: 98.3 F (36.8 C) 97.7 F (36.5 C) 98 F (36.7 C) 98.6 F (37 C)  TempSrc: Oral Oral Oral   Resp: 16 16 18 16   Height:      Weight:      SpO2: 97% 98% 98% 98%   Weight change:   Intake/Output Summary (Last 24 hours) at 09/22/11 1327 Last data filed at 09/22/11 1100  Gross per 24 hour  Intake    360 ml  Output      1 ml  Net    359 ml    General: Alert, awake, oriented x3, in no acute distress.  HEENT: No bruits, no goiter.  Heart: Regular rate and rhythm, without murmurs, rubs, gallops.  Lungs: Crackles left side, bilateral air movement.  Abdomen: Soft, nontender, mild distended, positive bowel sounds.  Neuro: Grossly intact, nonfocal. Extremities; trace edema.   Lab Results:  Basename 09/22/11 0453 10/21/11 1632 Oct 21, 2011 0535 09/19/11 2218  NA 139 138 -- --  K 2.9* 2.5* -- --  CL 105 103 -- --  CO2 28 30 -- --  GLUCOSE 104* 105* -- --  BUN 25* 33* -- --  CREATININE 1.05 1.04 -- --  CALCIUM 7.6* 7.7* -- --  MG -- -- 2.4 2.4  PHOS -- -- -- --    Basename 10/21/11 0535 09/19/11 1416  AST 22 28  ALT 19 24  ALKPHOS 47 53  BILITOT 0.8 0.7  PROT 6.1 7.2  ALBUMIN 3.3* 3.9    Basename 09/19/11 1416  LIPASE 29  AMYLASE --    Basename 09/22/11 0453 October 21, 2011 0535 09/19/11 1416  WBC 5.8 10.4 --  NEUTROABS -- -- 8.5*  HGB 11.4* 13.6 --  HCT 33.0* 40.5 --  MCV 85.3 86.7 --  PLT 186 260 --    Basename 09/19/11 2218  TSH 0.890  T4TOTAL --  T3FREE --  THYROIDAB --    Micro Results: No results found for this or any previous visit (from the past 240 hour(s)).  Studies/Results: Dg Abd 2 Views  10/21/11  *RADIOLOGY REPORT*  Clinical Data: Decreased abdominal distension.  Diarrhea. Volvulus.  ABDOMEN - 2 VIEW   Comparison: 09/20/2011 plain film exam.  09/19/2011 CT.  Findings: Gas distended bowel loops with decrease in the degree of gas distended small bowel loops but with persistent colonic distension.  Sigmoid colon which projects superiorly measures up to 9.8 cm.  No plain film evidence of pneumatosis or free intraperitoneal air.  Nasogastric tube side hole beyond the gastroesophageal junction with the tip at the level of the proximal gastric body.  IMPRESSION: Decreased gas distended small bowel loops with persistent abnormal distension of the colon as detailed above.  Original Report Authenticated By: Fuller Canada, M.D.    Medications: I have reviewed the patient's current medications.  Hypokalemia (09/19/2011) KCL 40 meq po three times a day ordered. Repeat Bmet in am.   Large bowel obstruction secondary to Sigmoid Volvulus:  Patient had Flexible Sigmoidoscopy for decompression of volvulus on 09-20-2011. Continue with clears advance diet as tolerated. Clinically improving. If Volvulus reoccurs Patient will likely require surgery. Appreciate GI and Surgery help. Continue with miralax.   HTN (hypertension) (09/19/2011) Monitor.  Lung nodules (09/19/2011) Needs Ct chest to follow up lung  nodule  Aortic stenosis (09/20/2011)         LOS: 3 days   REGALADO,BELKYS M.D.  [facility] 09/22/2011, 1:27 PM

## 2011-09-22 NOTE — Progress Notes (Signed)
2 Days Post-Op  Subjective: Patient is alert and has no complaints. Denies abdominal pain. Denies nausea or or cramps. Says he's had several loose stools. He says that his abdominal distention is much less but his abdominal contour is not back to normal yet. Sipping on clear liquids.  Objective: Vital signs in last 24 hours: Temp:  [98 F (36.7 C)-98.6 F (37 C)] 98.6 F (37 C) (01/30 2100) Pulse Rate:  [45-60] 45  (01/30 2100) Resp:  [16-18] 16  (01/30 2100) BP: (133-138)/(66-76) 138/76 mmHg (01/30 2100) SpO2:  [98 %] 98 % (01/30 2100) Last BM Date: 09/21/11  Intake/Output from previous day: 01/30 0701 - 01/31 0700 In: 960 [P.O.:960] Out: 401 [Urine:400; Stool:1] Intake/Output this shift:    General appearance: alert. Pleasant. Oriented. In no distress. GI: abdomen is soft and nontender. No mass or hernia. Still a little distended.  Lab Results:   Basename 09/22/11 0453 09/21/11 0535  WBC 5.8 10.4  HGB 11.4* 13.6  HCT 33.0* 40.5  PLT 186 260   BMET  Basename 09/22/11 0453 09/21/11 1632  NA 139 138  K 2.9* 2.5*  CL 105 103  CO2 28 30  GLUCOSE 104* 105*  BUN 25* 33*  CREATININE 1.05 1.04  CALCIUM 7.6* 7.7*   PT/INR No results found for this basename: LABPROT:2,INR:2 in the last 72 hours ABG No results found for this basename: PHART:2,PCO2:2,PO2:2,HCO3:2 in the last 72 hours  Studies/Results: Abd 1 View (kub)  09/20/2011  *RADIOLOGY REPORT*  Clinical Data: Abdominal distention  ABDOMEN - 1 VIEW  Comparison: Yesterday  Findings: Gaseous distention of small and large bowel has worsened. Small bowel distention is now present.  No obvious free intraperitoneal gas.  IMPRESSION: Diffuse gaseous distention of small and large bowel which is now worse.  Original Report Authenticated By: Donavan Burnet, M.D.   Dg Abd 2 Views  09/21/2011  *RADIOLOGY REPORT*  Clinical Data: Decreased abdominal distension.  Diarrhea. Volvulus.  ABDOMEN - 2 VIEW  Comparison: 09/20/2011 plain  film exam.  09/19/2011 CT.  Findings: Gas distended bowel loops with decrease in the degree of gas distended small bowel loops but with persistent colonic distension.  Sigmoid colon which projects superiorly measures up to 9.8 cm.  No plain film evidence of pneumatosis or free intraperitoneal air.  Nasogastric tube side hole beyond the gastroesophageal junction with the tip at the level of the proximal gastric body.  IMPRESSION: Decreased gas distended small bowel loops with persistent abnormal distension of the colon as detailed above.  Original Report Authenticated By: Fuller Canada, M.D.    Anti-infectives: Anti-infectives    None      Assessment/Plan: s/p Procedure(s): FLEXIBLE SIGMOIDOSCOPY  Sigmoid volvulus, acute obstructive episode resolved with flexible sigmoidoscopy. First episode.  Agree with bowel regimen and advancing diet as tolerated. Defer management of this issue to Dr. Ewing Schlein..  Hypokalemia, persistent, I have ordered K-Dur 40 mEq 3 times a day today and lab work for tomorrow  I have advised him that if his bowel function normalizes on a regular diet we can consider avoiding operation at this time. If he re-obstructs now or in the future he will need an operation.     LOS: 3 days    [name] Galloway M 09/22/2011

## 2011-09-23 ENCOUNTER — Encounter (HOSPITAL_COMMUNITY): Payer: Self-pay | Admitting: Gastroenterology

## 2011-09-23 ENCOUNTER — Inpatient Hospital Stay (HOSPITAL_COMMUNITY): Payer: Medicare Other

## 2011-09-23 LAB — BASIC METABOLIC PANEL
BUN: 13 mg/dL (ref 6–23)
Calcium: 7.6 mg/dL — ABNORMAL LOW (ref 8.4–10.5)
Calcium: 7.9 mg/dL — ABNORMAL LOW (ref 8.4–10.5)
Chloride: 102 mEq/L (ref 96–112)
Creatinine, Ser: 1.03 mg/dL (ref 0.50–1.35)
Creatinine, Ser: 1.03 mg/dL (ref 0.50–1.35)
GFR calc Af Amer: 81 mL/min — ABNORMAL LOW (ref >90)
GFR calc Af Amer: 81 mL/min — ABNORMAL LOW (ref >90)
GFR calc non Af Amer: 69 mL/min — ABNORMAL LOW (ref >90)
GFR calc non Af Amer: 69 mL/min — ABNORMAL LOW (ref >90)
Sodium: 136 mEq/L (ref 135–145)

## 2011-09-23 LAB — CBC
MCH: 29.2 pg (ref 26.0–34.0)
MCV: 84.5 fL (ref 78.0–100.0)
Platelets: 185 10*3/uL (ref 150–400)
RDW: 12.7 % (ref 11.5–15.5)

## 2011-09-23 LAB — PHOSPHORUS: Phosphorus: 2.1 mg/dL — ABNORMAL LOW (ref 2.3–4.6)

## 2011-09-23 LAB — MAGNESIUM: Magnesium: 1.9 mg/dL (ref 1.5–2.5)

## 2011-09-23 MED ORDER — POTASSIUM CHLORIDE CRYS ER 20 MEQ PO TBCR
40.0000 meq | EXTENDED_RELEASE_TABLET | ORAL | Status: AC
  Administered 2011-09-23 (×2): 40 meq via ORAL
  Filled 2011-09-23 (×2): qty 2

## 2011-09-23 MED ORDER — POTASSIUM CHLORIDE 10 MEQ/100ML IV SOLN
10.0000 meq | INTRAVENOUS | Status: AC
  Administered 2011-09-23 (×6): 10 meq via INTRAVENOUS
  Filled 2011-09-23 (×6): qty 100

## 2011-09-23 MED ORDER — POLYETHYLENE GLYCOL 3350 17 G PO PACK: 17.0000 g | PACK | Freq: Two times a day (BID) | ORAL | Status: DC

## 2011-09-23 NOTE — Progress Notes (Signed)
   CARE MANAGEMENT NOTE 09/23/2011  Patient:  [name] Galloway, [name] Galloway   Account Number:  [id number]  Date Initiated:  09/21/2011  Documentation initiated by:  Letha Cape  Subjective/Objective Assessment:   dx hypokalemia  admit- lives with spouse.     Action/Plan:   sbo--tolerating clears with ng clamped   Anticipated DC Date:  09/26/2011   Anticipated DC Plan:  HOME/SELF CARE      DC Planning Services  CM consult      Choice offered to / List presented to:             Status of service:  In process, will continue to follow Medicare Important Message given?   (If response is "NO", the following Medicare IM given date fields will be blank) Date Medicare IM given:   Date Additional Medicare IM given:    Discharge Disposition:    Per UR Regulation:    Comments:  09/23/11 13:37 Letha Cape RN, BSN [phone] patient with sbo, advancing diet, hopefully will be ready for dc over w/e.  09/21/11 16:16 Letha Cape RN, BSN [phone] Patient with sbo, ng clamped, tolerating clears, lives with spouse at home.

## 2011-09-23 NOTE — Progress Notes (Signed)
3 Days Post-Op  Subjective: No complaints of pain.  Taking clears, not walking,  +BM yesterday.  Still slightly distended.  Objective: Vital signs in last 24 hours: Temp:  [97.7 F (36.5 C)-98.3 F (36.8 C)] 97.7 F (36.5 C) (02/01 0500) Pulse Rate:  [65-85] 85  (02/01 0500) Resp:  [18] 18  (02/01 0500) BP: (122-130)/(65) 122/65 mmHg (02/01 0500) SpO2:  [96 %-98 %] 96 % (02/01 0500) Last BM Date: 09/23/11  Intake/Output from previous day: 01/31 0701 - 02/01 0700 In: 720 [P.O.:720] Out: 301 [Urine:300; Stool:1] Intake/Output this shift: Total I/O In: 240 [P.O.:240] Out: 1 [Urine:1]  PE:  Alert, No discomfort.  Abd:  Still slightly distended, +BS, +FLatus, +BM yesterday.  Lab Results:   [facility] 09/23/11 0513 09/22/11 0453  WBC 5.7 5.8  HGB 10.9* 11.4*  HCT 31.5* 33.0*  PLT 185 186    Lab 09/21/11 0535 09/19/11 1416  AST 22 28  ALT 19 24  ALKPHOS 47 53  BILITOT 0.8 0.7  PROT 6.1 7.2  ALBUMIN 3.3* 3.9    BMET  Basename 09/23/11 0513 09/22/11 0453  NA 136 139  K 2.5* 2.9*  CL 102 105  CO2 28 28  GLUCOSE 101* 104*  BUN 17 25*  CREATININE 1.03 1.05  CALCIUM 7.6* 7.6*   PT/INR No results found for this basename: LABPROT:2,INR:2 in the last 72 hours   Studies/Results: No results found.  Anti-infectives: Anti-infectives    None     Current Facility-Administered Medications  Medication Dose Route Frequency Provider Last Rate Last Dose  . dextrose 5 % and 0.9 % NaCl with KCl 20 mEq/L infusion   Intravenous Continuous Hartley Barefoot, MD 50 mL/hr at 09/23/11 0341    . enoxaparin (LOVENOX) injection 40 mg  40 mg Subcutaneous Q24H Lonia Blood, MD   40 mg at 09/22/11 2248  . HYDROmorphone (DILAUDID) injection 1 mg  1 mg Intravenous Q4H PRN Lonia Blood, MD      . polyethylene glycol (MIRALAX / GLYCOLAX) packet 17 g  17 g Oral BID Petra Kuba, MD   17 g at 09/22/11 2249  . potassium chloride SA (K-DUR,KLOR-CON) CR tablet 40 mEq  40 mEq Oral TID Ernestene Mention, MD   40 mEq at 09/22/11 2248  . sodium chloride 0.9 % injection 3 mL  3 mL Intravenous Q12H Lonia Blood, MD   3 mL at 09/20/11 2150  . DISCONTD: dextrose 5 %-0.9 % sodium chloride infusion   Intravenous Continuous Belkys Regalado, MD        Assessment/Plan 1. Sigmoid Volvulus  2. Chronic constipation  3. Aortic/Mitral calcification, Aortic stenosis, Mitral Regurgitation on Echo 2005  4. Hypokalemia K=2.5 5.Dyslipidemia  6. Hypertension  7.Hx. Tobacco use, 2small nodule up to 9mm RLL.  8.Hiatal Hernia  Plan:  Discuss upping diet.     LOS: 4 days    Kijuan Gallicchio 09/23/2011

## 2011-09-23 NOTE — Progress Notes (Signed)
[name] Galloway 11:16 AM   Subjective: The patient is doing better and is ready to advance his diet and is moving his bowel And has no new complaints   Objective: Vital signs stable afebrile no acute distress abdomen is soft nontender good bowel sound potassium still low  Assessment: Volvulus improved  Plan: Increase potassium per primary team recommend checking magnesium as well and please call my partner this weekend if we can be of any further assistance and okay to advance diet slowly  [facility] E

## 2011-09-23 NOTE — Progress Notes (Signed)
The patient is seen and examined. I agree with the assessment and care plan as outlined by Mr. Marlyne Beards.  He's better, abdomen a little softer, still distended.  Plan:   IV and P.O. KCl             Check abdominal films             miralax BID             Full liquids  Angelia Mould. Derrell Lolling, M.D., [facility] General and Minimally invasive Surgery Breast and Colorectal Surgery Office:   [phone] Pager:   [phone]

## 2011-09-23 NOTE — Progress Notes (Signed)
Utilization review completed.  

## 2011-09-23 NOTE — Progress Notes (Signed)
Subjective: Patient relates 2 BM today. No abdominal pain, Abdominal distension has decreased.  Objective: Filed Vitals:   09/21/11 2100 09/22/11 1342 Oct 16, 2011 0500 October 16, 2011 1416  BP: 138/76 130/65 122/65 123/66  Pulse: 45 65 85 75  Temp: 98.6 F (37 C) 98.3 F (36.8 C) 97.7 F (36.5 C) 98 F (36.7 C)  TempSrc:  Oral Oral   Resp: 16 18 18 18   Height:      Weight:      SpO2: 98% 98% 96% 98%   Weight change:   Intake/Output Summary (Last 24 hours) at 10-16-2011 1513 Last data filed at 10-16-2011 0900  Gross per 24 hour  Intake    240 ml  Output    301 ml  Net    -61 ml    General: Alert, awake, oriented x3, in no acute distress.  HEENT: No bruits, no goiter.  Heart: Regular rate and rhythm, without murmurs, rubs, gallops.  Lungs: Crackles left side, bilateral air movement.  Abdomen: Soft, nontender, milddistended, positive bowel sounds.  Neuro: Grossly intact, nonfocal. Extremities; no edema.   Lab Results:  Basename October 16, 2011 0513 09/22/11 0453 09/21/11 0535  NA 136 139 --  K 2.5* 2.9* --  CL 102 105 --  CO2 28 28 --  GLUCOSE 101* 104* --  BUN 17 25* --  CREATININE 1.03 1.05 --  CALCIUM 7.6* 7.6* --  MG -- -- 2.4  PHOS -- -- --    Basename 09/21/11 0535  AST 22  ALT 19  ALKPHOS 47  BILITOT 0.8  PROT 6.1  ALBUMIN 3.3*    Basename 10-16-2011 0513 09/22/11 0453  WBC 5.7 5.8  NEUTROABS -- --  HGB 10.9* 11.4*  HCT 31.5* 33.0*  MCV 84.5 85.3  PLT 185 186    Micro Results: No results found for this or any previous visit (from the past 240 hour(s)).  Studies/Results: Dg Abd 2 Views  2011/10/16  *RADIOLOGY REPORT*  Clinical Data: Abdominal distention, diarrhea, volvulus  ABDOMEN - 2 VIEW  Comparison: September 21, 2011  Findings: The NG tube has been removed.  Colonic distention has improved some.  The gas-distended loop of sigmoid colon now measures 6.8 cm (previously 9.8 cm. ) Mild gaseous distention of small bowel loops has also improved.  No pneumoperitoneum.   IMPRESSION: There has been some interval improvement in gaseous distention of the sigmoid colon.  No pneumoperitoneum.  Original Report Authenticated By: Brandon Melnick, M.D.    Medications: I have reviewed the patient's current medications.   Hypokalemia (09/19/2011) KCL 40 meq po times two ordered. 6 runs IV. I will repeat B-met tonight. I will check Mg and phosphate.   Large bowel obstruction secondary to Sigmoid Volvulus:  Patient had Flexible Sigmoidoscopy for decompression of volvulus on 09-20-2011. Clinically improving. If Volvulus reoccurs Patient will likely require surgery. Appreciate GI and Surgery help. Continue with miralax. Diet change to full liquid, KUB pending, will defer to surgery advance diet.   HTN (hypertension) (09/19/2011) Monitor.  Lung nodules (09/19/2011) Needs Ct chest to follow up lung nodule  Aortic stenosis (09/20/2011)    LOS: 4 days   REGALADO,BELKYS M.D.  [facility] 10/16/11, 3:13 PM

## 2011-09-24 LAB — BASIC METABOLIC PANEL
CO2: 28 mEq/L (ref 19–32)
Calcium: 7.9 mg/dL — ABNORMAL LOW (ref 8.4–10.5)
Creatinine, Ser: 1.02 mg/dL (ref 0.50–1.35)
GFR calc non Af Amer: 70 mL/min — ABNORMAL LOW (ref >90)

## 2011-09-24 LAB — MAGNESIUM: Magnesium: 1.9 mg/dL (ref 1.5–2.5)

## 2011-09-24 MED ORDER — POTASSIUM CHLORIDE CRYS ER 20 MEQ PO TBCR: 20.0000 meq | EXTENDED_RELEASE_TABLET | Freq: Two times a day (BID) | ORAL | Status: DC

## 2011-09-24 MED ORDER — POTASSIUM CHLORIDE CRYS ER 20 MEQ PO TBCR
40.0000 meq | EXTENDED_RELEASE_TABLET | Freq: Two times a day (BID) | ORAL | Status: AC
  Administered 2011-09-24 (×2): 40 meq via ORAL
  Filled 2011-09-24: qty 2

## 2011-09-24 MED ORDER — POTASSIUM PHOSPHATE MONOBASIC 500 MG PO TABS
500.0000 mg | ORAL_TABLET | Freq: Two times a day (BID) | ORAL | Status: AC
  Administered 2011-09-24 (×2): 500 mg via ORAL
  Filled 2011-09-24 (×3): qty 1

## 2011-09-24 MED ORDER — MAGNESIUM OXIDE 400 MG PO TABS
200.0000 mg | ORAL_TABLET | Freq: Two times a day (BID) | ORAL | Status: AC
  Administered 2011-09-24: 11:00:00 via ORAL
  Administered 2011-09-24: 200 mg via ORAL
  Filled 2011-09-24 (×2): qty 0.5

## 2011-09-24 MED ORDER — POTASSIUM CHLORIDE CRYS ER 20 MEQ PO TBCR
40.0000 meq | EXTENDED_RELEASE_TABLET | Freq: Three times a day (TID) | ORAL | Status: DC
  Filled 2011-09-24: qty 2

## 2011-09-24 NOTE — Progress Notes (Signed)
  Subjective: Feels good, less distended no pain tolerating liquids, had more BM's  Objective: Vital signs in last 24 hours: Temp:  [97.5 F (36.4 C)-98 F (36.7 C)] 97.5 F (36.4 C) (02/02 0500) Pulse Rate:  [61-82] 82  (02/02 0500) Resp:  [18-20] 18  (02/02 0500) BP: (123-133)/(66-71) 128/68 mmHg (02/02 0500) SpO2:  [98 %] 98 % (02/02 0500)   Intake/Output from previous day: 10-08-2022 0701 - 02/02 0700 In: 240 [P.O.:240] Out: 1 [Urine:1] Intake/Output this shift:     General appearance: alert and no distress Resp: clear to auscultation bilaterally GI: soft, non-tender; bowel sounds normal; no masses,  no organomegaly    Lab Results:   [facility] 10-09-2011 0513 09/22/11 0453  WBC 5.7 5.8  HGB 10.9* 11.4*  HCT 31.5* 33.0*  PLT 185 186   BMET  Basename 09/24/11 0600 09-Oct-2011 2131  NA 135 134*  K 3.0* 3.5  CL 102 102  CO2 28 28  GLUCOSE 103* 91  BUN 11 13  CREATININE 1.02 1.03  CALCIUM 7.9* 7.9*   PT/INR No results found for this basename: LABPROT:2,INR:2 in the last 72 hours ABG No results found for this basename: PHART:2,PCO2:2,PO2:2,HCO3:2 in the last 72 hours  MEDS, PRN HYDROmorphone  MEDS, Scheduled    . enoxaparin  40 mg Subcutaneous Q24H  . polyethylene glycol  17 g Oral BID  . potassium chloride  10 mEq Intravenous Q1 Hr x 6  . potassium chloride  40 mEq Oral Q4H  . sodium chloride  3 mL Intravenous Q12H  . DISCONTD: polyethylene glycol  17 g Oral BID    Studies/Results: Dg Abd 2 Views  10/09/11  *RADIOLOGY REPORT*  Clinical Data: Abdominal distention, diarrhea, volvulus  ABDOMEN - 2 VIEW  Comparison: September 21, 2011  Findings: The NG tube has been removed.  Colonic distention has improved some.  The gas-distended loop of sigmoid colon now measures 6.8 cm (previously 9.8 cm. ) Mild gaseous distention of small bowel loops has also improved.  No pneumoperitoneum.  IMPRESSION: There has been some interval improvement in gaseous distention of the  sigmoid colon.  No pneumoperitoneum.  Original Report Authenticated By: Brandon Melnick, M.D.    Anti-infectives: Anti-infectives    None      Assessment/Plan: s/p Procedure(s): FLEXIBLE SIGMOIDOSCOPY Mild hypokalemia, improved from volvulus. OK to advance diet. Will follow  LOS: 5 days    [place] J 09/24/2011 7:28 AM

## 2011-09-24 NOTE — Progress Notes (Signed)
Subjective: Feeling better, no abdominal pain. Had 2 BM. Objective: Filed Vitals:   10-21-2011 0500 10/21/11 1416 Oct 21, 2011 2100 09/24/11 0500  BP: 122/65 123/66 133/71 128/68  Pulse: 85 75 61 82  Temp: 97.7 F (36.5 C) 98 F (36.7 C) 98 F (36.7 C) 97.5 F (36.4 C)  TempSrc: Oral  Oral Oral  Resp: 18 18 20 18   Height:      Weight:      SpO2: 96% 98% 98% 98%   Weight change:   Intake/Output Summary (Last 24 hours) at 09/24/11 0850 Last data filed at 10-21-11 0900  Gross per 24 hour  Intake    240 ml  Output      1 ml  Net    239 ml    General: Alert, awake, oriented x3, in no acute distress.  HEENT: No bruits, no goiter.  Heart: Regular rate and rhythm, without murmurs, rubs, gallops.  Lungs: Crackles left side, bilateral air movement.  Abdomen: Soft, nontender, mild distended, positive bowel sounds.  Neuro: Grossly intact, nonfocal. Extremities; no edema.   Lab Results:  Basename 09/24/11 0600 10-21-11 2131 Oct 21, 2011 1611  NA 135 134* --  K 3.0* 3.5 --  CL 102 102 --  CO2 28 28 --  GLUCOSE 103* 91 --  BUN 11 13 --  CREATININE 1.02 1.03 --  CALCIUM 7.9* 7.9* --  MG 1.9 -- 1.9  PHOS -- -- 2.1*   Basename 10-21-2011 0513 09/22/11 0453  WBC 5.7 5.8  NEUTROABS -- --  HGB 10.9* 11.4*  HCT 31.5* 33.0*  MCV 84.5 85.3  PLT 185 186    Micro Results: No results found for this or any previous visit (from the past 240 hour(s)).  Studies/Results: Dg Abd 2 Views  October 21, 2011  *RADIOLOGY REPORT*  Clinical Data: Abdominal distention, diarrhea, volvulus  ABDOMEN - 2 VIEW  Comparison: September 21, 2011  Findings: The NG tube has been removed.  Colonic distention has improved some.  The gas-distended loop of sigmoid colon now measures 6.8 cm (previously 9.8 cm. ) Mild gaseous distention of small bowel loops has also improved.  No pneumoperitoneum.  IMPRESSION: There has been some interval improvement in gaseous distention of the sigmoid colon.  No pneumoperitoneum.  Original  Report Authenticated By: Brandon Melnick, M.D.    Medications: I have reviewed the patient's current medications.  Hypokalemia (09/19/2011) KCL 40 meq po TID 3 doses.  Will replete Mg and phosphate.  Large bowel obstruction secondary to Sigmoid Volvulus:  Patient had Flexible Sigmoidoscopy for decompression of volvulus on 09-20-2011. Clinically improving. If Volvulus reoccurs Patient will likely require surgery. Appreciate GI and Surgery help. Continue with miralax. Diet advance. KUB 2-1:  Some improvement in gaseous  distension.   HTN (hypertension) (09/19/2011) Monitor.  Lung nodules (09/19/2011) Needs Ct chest to follow up lung nodule  Aortic stenosis (1/29      LOS: 5 days   Tremell Reimers M.D.  [facility] 09/24/2011, 8:50 AM

## 2011-09-25 LAB — BASIC METABOLIC PANEL
Chloride: 98 mEq/L (ref 96–112)
GFR calc Af Amer: 76 mL/min — ABNORMAL LOW (ref >90)
GFR calc non Af Amer: 66 mL/min — ABNORMAL LOW (ref >90)
Potassium: 2.9 mEq/L — ABNORMAL LOW (ref 3.5–5.1)
Sodium: 136 mEq/L (ref 135–145)

## 2011-09-25 MED ORDER — POTASSIUM CHLORIDE CRYS ER 20 MEQ PO TBCR
40.0000 meq | EXTENDED_RELEASE_TABLET | Freq: Three times a day (TID) | ORAL | Status: AC
  Administered 2011-09-25 (×3): 40 meq via ORAL
  Filled 2011-09-25 (×3): qty 2

## 2011-09-25 MED ORDER — POLYETHYLENE GLYCOL 3350 17 G PO PACK
17.0000 g | PACK | Freq: Every day | ORAL | Status: DC
  Administered 2011-09-25 – 2011-09-26 (×2): 17 g via ORAL
  Filled 2011-09-25 (×2): qty 1

## 2011-09-25 NOTE — Progress Notes (Signed)
   Subjective: Continues to feel better, no pain tolerating diet and having BM's  Objective: Vital signs in last 24 hours: Temp:  [97.3 F (36.3 C)-98.2 F (36.8 C)] 98 F (36.7 C) (02/03 0500) Pulse Rate:  [61-80] 62  (02/03 0500) Resp:  [16-20] 16  (02/03 0500) BP: (106-144)/(53-73) 106/53 mmHg (02/03 0500) SpO2:  [96 %-99 %] 97 % (02/03 0500) Last BM Date: 09/24/11  Intake/Output from previous day: 02/02 0701 - 02/03 0700 In: 600 [P.O.:600] Out: 400 [Urine:400] Intake/Output this shift:    General appearance: alert and no distress GI: soft, non-tender; bowel sounds normal; no masses,  no organomegaly  Lab Results:  No results found for this or any previous visit (from the past 24 hour(s)).   Studies/Results @RISRSLT24 @    [facility], Scheduled    . enoxaparin  40 mg Subcutaneous Q24H  . magnesium oxide  200 mg Oral BID  . polyethylene glycol  17 g Oral BID  . potassium chloride  40 mEq Oral BID  . potassium phosphate (monobasic)  500 mg Oral BID WC  . sodium chloride  3 mL Intravenous Q12H  . DISCONTD: potassium chloride  20 mEq Oral BID  . DISCONTD: potassium chloride  40 mEq Oral TID     Assessment/Plan: Volvulus, resolved   Patient has improved, no surgical indication unless volvulus recurs. We will see again PRN   LOS: 6 days    [name] Galloway 09/25/2011

## 2011-09-25 NOTE — Progress Notes (Signed)
Subjective: Patient relates feeling well, tolerates diet, had 2 BM.  Objective: Filed Vitals:   09/24/11 0500 09/24/11 1300 09/24/11 2100 09/25/11 0500  BP: 128/68 110/72 144/73 106/53  Pulse: 82 80 61 62  Temp: 97.5 F (36.4 C) 97.3 F (36.3 C) 98.2 F (36.8 C) 98 F (36.7 C)  TempSrc: Oral Oral    Resp: 18 20 18 16   Height:      Weight:      SpO2: 98% 99% 96% 97%   Weight change:   Intake/Output Summary (Last 24 hours) at 09/25/11 1036 Last data filed at 09/25/11 0500  Gross per 24 hour  Intake    240 ml  Output    400 ml  Net   -160 ml    General: Alert, awake, oriented x3, in no acute distress.  HEENT: No bruits, no goiter.  Heart: Regular rate and rhythm, without murmurs, rubs, gallops.  Lungs: Crackles left side, bilateral air movement.  Abdomen: Soft, nontender, nondistended, positive bowel sounds.  Neuro: Grossly intact, nonfocal. Extremities; no edema.   Lab Results:  Basename 09/24/11 0600 10-13-2011 2131 Oct 13, 2011 1611  NA 135 134* --  K 3.0* 3.5 --  CL 102 102 --  CO2 28 28 --  GLUCOSE 103* 91 --  BUN 11 13 --  CREATININE 1.02 1.03 --  CALCIUM 7.9* 7.9* --  MG 1.9 -- 1.9  PHOS -- -- 2.1*    Basename 10/13/2011 0513  WBC 5.7  NEUTROABS --  HGB 10.9*  HCT 31.5*  MCV 84.5  PLT 185    Micro Results: No results found for this or any previous visit (from the past 240 hour(s)).  Studies/Results: Dg Abd 2 Views  10-13-2011  *RADIOLOGY REPORT*  Clinical Data: Abdominal distention, diarrhea, volvulus  ABDOMEN - 2 VIEW  Comparison: September 21, 2011  Findings: The NG tube has been removed.  Colonic distention has improved some.  The gas-distended loop of sigmoid colon now measures 6.8 cm (previously 9.8 cm. ) Mild gaseous distention of small bowel loops has also improved.  No pneumoperitoneum.  IMPRESSION: There has been some interval improvement in gaseous distention of the sigmoid colon.  No pneumoperitoneum.  Original Report Authenticated By: Brandon Melnick, M.D.    Medications: I have reviewed the patient's current medications.  Hypokalemia (09/19/2011) Bmet pending , will replete as needed.  repleted Mg and phosphate.  Repeat phosphate and Mg in am.   Large bowel obstruction secondary to Sigmoid Volvulus:  Patient had Flexible Sigmoidoscopy for decompression of volvulus on 09-20-2011. Clinically improving. If Volvulus reoccurs Patient will likely require surgery. Appreciate GI and Surgery help. Continue with miralax. . KUB 2-1: Some improvement in gaseous distension.  Tolerating diet, if patient stable will likely discharge him tomorrow. Will monitor for 24 hour more.   HTN (hypertension) (09/19/2011) Monitor.  Lung nodules (09/19/2011) Needs Ct chest to follow up lung nodule  Aortic stenosis (1/29      LOS: 6 days   [name] M.D.  [facility] 09/25/2011, 10:36 AM

## 2011-09-26 LAB — BASIC METABOLIC PANEL
CO2: 29 mEq/L (ref 19–32)
Chloride: 101 mEq/L (ref 96–112)
GFR calc Af Amer: 66 mL/min — ABNORMAL LOW (ref >90)
Potassium: 3.5 mEq/L (ref 3.5–5.1)
Sodium: 134 mEq/L — ABNORMAL LOW (ref 135–145)

## 2011-09-26 LAB — PHOSPHORUS: Phosphorus: 3.1 mg/dL (ref 2.3–4.6)

## 2011-09-26 LAB — MAGNESIUM: Magnesium: 2.1 mg/dL (ref 1.5–2.5)

## 2011-09-26 MED ORDER — POTASSIUM CHLORIDE ER 10 MEQ PO TBCR: 20.0000 meq | EXTENDED_RELEASE_TABLET | Freq: Two times a day (BID) | ORAL | Status: DC

## 2011-09-26 MED ORDER — POLYETHYLENE GLYCOL 3350 17 G PO PACK: 17.0000 g | PACK | Freq: Two times a day (BID) | ORAL | Status: DC

## 2011-09-26 MED ORDER — POTASSIUM CHLORIDE CRYS ER 20 MEQ PO TBCR
40.0000 meq | EXTENDED_RELEASE_TABLET | Freq: Once | ORAL | Status: AC
  Administered 2011-09-26: 40 meq via ORAL
  Filled 2011-09-26: qty 2

## 2011-09-26 NOTE — Progress Notes (Signed)
   CARE MANAGEMENT NOTE 09/26/2011  Patient:  STANISLAW, ACTON   Account Number:  [id number]  Date Initiated:  09/21/2011  Documentation initiated by:  Letha Cape  Subjective/Objective Assessment:   dx hypokalemia  admit- lives with spouse.     Action/Plan:   sbo--tolerating clears with ng clamped   Anticipated DC Date:  09/26/2011   Anticipated DC Plan:  HOME/SELF CARE      DC Planning Services  CM consult      Choice offered to / List presented to:             Status of service:  Completed, signed off Medicare Important Message given?   (If response is "NO", the following Medicare IM given date fields will be blank) Date Medicare IM given:   Date Additional Medicare IM given:    Discharge Disposition:  HOME/SELF CARE  Per UR Regulation:    Comments:  09/26/11 14:33 Letha Cape RN, BSN  [phone] Pt dc to home today, tolerated diet.  09/23/11 13:37 Letha Cape RN, BSN [phone] patient with sbo, advancing diet, hopefully will be ready for dc over w/e.  09/21/11 16:16 Letha Cape RN, BSN [phone] Patient with sbo, ng clamped, tolerating clears, lives with spouse at home.

## 2011-09-26 NOTE — Discharge Summary (Signed)
Admit date: 09/19/2011 Discharge date: 09/26/2011  Primary Care Physician:  Sissy Hoff, MD, MD   Discharge Diagnoses:            Large bowel obstruction secondary to Sigmoid Volvulus  Date Noted   . Hypokalemia 09/19/2011   . Aortic stenosis 09/20/2011   . Large bowel obstruction 09/19/2011   . Constipation 09/19/2011   . Anemia 09/19/2011   . HTN (hypertension) 09/19/2011   . Lung nodules 09/19/2011              DISCHARGE MEDICATION: Medication List  As of 09/26/2011  8:07 PM   STOP taking these medications         lisinopril-hydrochlorothiazide 10-12.5 MG per tablet      lubiprostone 24 MCG capsule         TAKE these medications         aspirin 81 MG tablet   Take 81 mg by mouth daily.      fish oil-omega-3 fatty acids 1000 MG capsule   Take 1 g by mouth daily.      polyethylene glycol packet   Commonly known as: MIRALAX / GLYCOLAX   Take 17 g by mouth 2 (two) times daily.      potassium chloride 10 MEQ tablet   Commonly known as: K-DUR   Take 2 tablets (20 mEq total) by mouth 2 (two) times daily.              Consults: Treatment Team:  Petra Kuba, MD   SIGNIFICANT DIAGNOSTIC STUDIES:  Abd 1 View (kub)  09/20/2011  *RADIOLOGY REPORT*  Clinical Data: Abdominal distention  ABDOMEN - 1 VIEW  Comparison: Yesterday  Findings: Gaseous distention of small and large bowel has worsened. Small bowel distention is now present.  No obvious free intraperitoneal gas.  IMPRESSION: Diffuse gaseous distention of small and large bowel which is now worse.  Original Report Authenticated By: Donavan Burnet, M.D.   Ct Abdomen Pelvis W Contrast  09/19/2011  *RADIOLOGY REPORT*  Clinical Data: 3-week history of abdominal pain, decreased bowel movements, anorexia, and nausea/vomiting.  CT ABDOMEN AND PELVIS WITH CONTRAST/20 03/2012:  Technique:  Multidetector CT imaging of the abdomen and pelvis was performed following the standard protocol during bolus administration of  intravenous contrast.  Contrast: 100 ml Omnipaque 300 IV.  Oral contrast also administered.  Comparison: Acute abdomen series 09/05/2011.  Findings: Interval marked worsening of the gaseous distention of the colon; the colon is now markedly distended to the level of the proximal sigmoid colon where there is an abrupt transition to decompressed colon.  No visible mass involving the sigmoid colon at the transition. Maximal colonic diameter 9.3 cm in the cecum. Large amount of liquid stool throughout the colon.  Inspissated stool in the cecum and ascending colon.  No evidence of small bowel distention.  Moderate sized hiatal hernia.  Thickening involving the wall of the visualized distal esophagus.  Stomach decompressed and otherwise unremarkable.  No free intraperitoneal air.  Beam hardening streak artifact as the patient was unable to raise the arms.  Allowing for this, no focal abnormality involving the liver, spleen, or pancreas.  Gallbladder decompressed and unremarkable by CT.  No biliary ductal dilation. Mild diffuse cortical thinning involving both kidneys which are otherwise unremarkable.  Normal adrenal glands.  Aorto-iliofemoral atherosclerosis without aneurysm.  Patent visceral arteries.  No significant lymphadenopathy.  Wide-mouth diverticulum involving the left posterolateral wall of the urinary bladder.  Moderate to severe prostate gland  enlargement, particularly the median lobe.  Numerous pelvic phleboliths.  Bone window images demonstrate degenerative changes involving the lower thoracic and lumbar spine.  2 nodules in the visualized right lower lobe, the largest approximating 9 mm (series 3, image 3).  Heart mildly enlarged with left for ventricular predominance and extensive mitral annular and aortic valvular calcification. Three-vessel coronary artery calcification.  IMPRESSION:  1.  Marked gaseous distention of the colon to the level of the proximal sigmoid colon, where there is an abrupt  transition to decompressed colon.  No visible mass at the site of transition. 2.  No evidence of small bowel obstruction or free intraperitoneal air.  3.  Moderate sized hiatal hernia.  Thickening of the wall of the visualized distal esophagus suggests GE reflux disease. 4.  Moderate to severe prostate gland enlargement. 5.  Diverticulum arising from the left posterolateral wall of the urinary bladder. 6.  Diffuse cortical thinning involving both kidneys. 7.  2 nodules in the visualized right lower lobe, the largest approximating 9 mm.  Non-emergent CT of the entire chest with contrast is suggested to further evaluation. 8.  Cardiomegaly.  Extensive mitral annular and aortic valvular calcification.  Three-vessel coronary artery calcification.  Original Report Authenticated By: Arnell Sieving, M.D.   Dg Abd 2 Views  09/23/2011  *RADIOLOGY REPORT*  Clinical Data: Abdominal distention, diarrhea, volvulus  ABDOMEN - 2 VIEW  Comparison: September 21, 2011  Findings: The NG tube has been removed.  Colonic distention has improved some.  The gas-distended loop of sigmoid colon now measures 6.8 cm (previously 9.8 cm. ) Mild gaseous distention of small bowel loops has also improved.  No pneumoperitoneum.  IMPRESSION: There has been some interval improvement in gaseous distention of the sigmoid colon.  No pneumoperitoneum.  Original Report Authenticated By: Brandon Melnick, M.D.   Dg Abd 2 Views  09/21/2011  *RADIOLOGY REPORT*  Clinical Data: Decreased abdominal distension.  Diarrhea. Volvulus.  ABDOMEN - 2 VIEW  Comparison: 09/20/2011 plain film exam.  09/19/2011 CT.  Findings: Gas distended bowel loops with decrease in the degree of gas distended small bowel loops but with persistent colonic distension.  Sigmoid colon which projects superiorly measures up to 9.8 cm.  No plain film evidence of pneumatosis or free intraperitoneal air.  Nasogastric tube side hole beyond the gastroesophageal junction with the tip at the  level of the proximal gastric body.  IMPRESSION: Decreased gas distended small bowel loops with persistent abnormal distension of the colon as detailed above.  Original Report Authenticated By: Fuller Canada, M.D.      BRIEF ADMITTING H & P:76 yo man with history of HTN and Hyperlipidemia presenting with 3 weeks of abdominal pain, nausea and vomiting and one week of progressive Constipation. Has had Colonoscopy last October which was reportedly fine. His abdominal pain is 10/10, distended, all over and not relieved by anything. Denies any fever, No Melena, no BRBPR. He has tried many laxatives but did not work  Hospital Course: Hypokalemia (09/19/2011) Likely related to diarrhea. His potasium decrease to 2.5. He received IV and oral Potassium supplement. Date of discharge K was at 3.5. He will be discharge on 20 meq twice a day.  Mg and phosphate was replaced.     Large bowel obstruction secondary to Sigmoid Volvulus:  Patient had Flexible Sigmoidoscopy for decompression of volvulus on 09-20-2011. GI and surgery help with patient care. Patient was treated conservative with Bowel rest and Bowel regimen. He was started on Miralax twice  a day. KUB Was repeated on 2-1 and showed Some improvement in gaseous distension. Patient was started on clear diet subsequently advance to regular diet. He has been tolerating diet for more that 24 hour. Patient was advised to return to hospital at any sign of Bowel obstruction ( vomiting, constipation). I discharge him off lubiprostone, because is contraindicated in bowel obstruction.    HTN (hypertension) (09/19/2011) His SBP has been 120 range. I will discharge him off BP medications. He will need to follow with PCP.  Lung nodules (09/19/2011) Needs Ct chest to follow up lung nodule, Patient aware of result.      Disposition and Follow-up: Need Bmet to follow K level. He will need Ct chest to follow and evaluate lung nodule. Discharge Orders    Future Orders  Please Complete By Expires   Diet general      Increase activity slowly        Follow-up Information    Follow up with [facility], MD in 1 week.   Contact information:   [address] [employer], [place]. [place] [address] [phone]       Follow up with Sissy Hoff, MD in 1 week. (You will need to have your potassium check. )    Contact information:   [address] [place] [address] [phone]           DISCHARGE EXAM:  General: Alert, awake, oriented x3, in no acute distress.  HEENT: No bruits, no goiter.  Heart: Regular rate and rhythm, without murmurs, rubs, gallops.  Lungs: Crackles left side, bilateral air movement.  Abdomen: Soft, nontender, nondistended, positive bowel sounds.  Neuro: Grossly intact, nonfocal.  Extremities; no edema.  Blood pressure 144/94, pulse 68, temperature 99.1 F (37.3 C), temperature source Oral, resp. rate 18, height 6' (1.829 m), weight 72.8 kg (160 lb 7.9 oz), SpO2 97.00%.   Basename 09/26/11 0513 09/25/11 0942 09/24/11 0600  NA 134* 136 --  K 3.5 2.9* --  CL 101 98 --  CO2 29 27 --  GLUCOSE 90 113* --  BUN 18 16 --  CREATININE 1.21 1.08 --  CALCIUM 8.0* 8.3* --  MG 2.1 -- 1.9  PHOS 3.1 -- --   No results found for this basename: AST:2,ALT:2,ALKPHOS:2,BILITOT:2,PROT:2,ALBUMIN:2 in the last 72 hours No results found for this basename: LIPASE:2,AMYLASE:2 in the last 72 hours No results found for this basename: WBC:2,NEUTROABS:2,HGB:2,HCT:2,MCV:2,PLT:2 in the last 72 hours  Signed: Koralee Wedeking M.D. 09/26/2011, 8:07 PM

## 2011-09-26 NOTE — Progress Notes (Signed)
Pt discharged to home per MD order. Pt and family received all discharge instructions, medication information, including follow up appointments, diet information and prescriptions.  Pt escorted to car by volunteer services. Efraim Kaufmann

## 2011-09-26 NOTE — Progress Notes (Signed)
Pt did not receive paper prescriptions on discharge.  RN notified MD and contacted pt to notify of missing prescriptions.  Prescriptions were called into [facility] in [place].  Pt and wife were notified that prescriptions will be ready for pickup in 1 hour. Efraim Kaufmann

## 2011-10-05 ENCOUNTER — Other Ambulatory Visit (HOSPITAL_COMMUNITY): Payer: Self-pay | Admitting: Family Medicine

## 2011-10-05 DIAGNOSIS — R911 Solitary pulmonary nodule: Secondary | ICD-10-CM

## 2011-10-10 ENCOUNTER — Encounter (HOSPITAL_COMMUNITY): Payer: Self-pay

## 2011-10-10 ENCOUNTER — Ambulatory Visit (HOSPITAL_COMMUNITY)
Admission: RE | Admit: 2011-10-10 | Discharge: 2011-10-10 | Disposition: A | Discharge status: Home / Self Care | Payer: Medicare Other | Source: Ambulatory Visit | Attending: Family Medicine | Admitting: Family Medicine

## 2011-10-10 DIAGNOSIS — I080 Rheumatic disorders of both mitral and aortic valves: Secondary | ICD-10-CM | POA: Insufficient documentation

## 2011-10-10 DIAGNOSIS — R059 Cough, unspecified: Secondary | ICD-10-CM | POA: Insufficient documentation

## 2011-10-10 DIAGNOSIS — R05 Cough: Secondary | ICD-10-CM | POA: Insufficient documentation

## 2011-10-10 DIAGNOSIS — J984 Other disorders of lung: Secondary | ICD-10-CM | POA: Insufficient documentation

## 2011-10-10 DIAGNOSIS — R911 Solitary pulmonary nodule: Secondary | ICD-10-CM

## 2011-10-10 DIAGNOSIS — I2510 Atherosclerotic heart disease of native coronary artery without angina pectoris: Secondary | ICD-10-CM | POA: Insufficient documentation

## 2011-10-10 MED ORDER — IOHEXOL 300 MG/ML  SOLN: 80.0000 mL | Freq: Once | INTRAMUSCULAR | Status: AC | PRN

## 2012-04-25 ENCOUNTER — Other Ambulatory Visit (HOSPITAL_COMMUNITY): Payer: Self-pay | Admitting: Family Medicine

## 2012-04-25 DIAGNOSIS — R911 Solitary pulmonary nodule: Secondary | ICD-10-CM

## 2012-04-30 ENCOUNTER — Ambulatory Visit (HOSPITAL_COMMUNITY)
Admission: RE | Admit: 2012-04-30 | Discharge: 2012-04-30 | Disposition: A | Discharge status: Home / Self Care | Payer: Medicare Other | Source: Ambulatory Visit | Attending: Family Medicine | Admitting: Family Medicine

## 2012-04-30 DIAGNOSIS — R911 Solitary pulmonary nodule: Secondary | ICD-10-CM

## 2012-04-30 LAB — CREATININE, SERUM
Creatinine, Ser: 1.17 mg/dL (ref 0.50–1.35)
GFR calc non Af Amer: 59 mL/min — ABNORMAL LOW (ref >90)

## 2012-04-30 MED ORDER — IOHEXOL 300 MG/ML  SOLN
80.0000 mL | Freq: Once | INTRAMUSCULAR | Status: AC | PRN
  Administered 2012-04-30: 80 mL via INTRAVENOUS

## 2013-04-24 ENCOUNTER — Other Ambulatory Visit: Payer: Self-pay | Admitting: Family Medicine

## 2013-04-24 DIAGNOSIS — R918 Other nonspecific abnormal finding of lung field: Secondary | ICD-10-CM

## 2013-04-24 DIAGNOSIS — D739 Disease of spleen, unspecified: Secondary | ICD-10-CM

## 2013-04-26 ENCOUNTER — Other Ambulatory Visit: Payer: Self-pay | Admitting: Family Medicine

## 2013-04-26 LAB — BUN: BUN: 28 mg/dL — ABNORMAL HIGH (ref 6–23)

## 2013-04-29 ENCOUNTER — Ambulatory Visit
Admission: RE | Admit: 2013-04-29 | Discharge: 2013-04-29 | Disposition: A | Discharge status: Home / Self Care | Payer: Self-pay | Source: Ambulatory Visit | Attending: Family Medicine | Admitting: Family Medicine

## 2013-04-29 ENCOUNTER — Ambulatory Visit
Admission: RE | Admit: 2013-04-29 | Discharge: 2013-04-29 | Disposition: A | Discharge status: Home / Self Care | Payer: Medicare Other | Source: Ambulatory Visit | Attending: Family Medicine | Admitting: Family Medicine

## 2013-04-29 DIAGNOSIS — D739 Disease of spleen, unspecified: Secondary | ICD-10-CM

## 2013-04-29 DIAGNOSIS — R918 Other nonspecific abnormal finding of lung field: Secondary | ICD-10-CM

## 2013-04-29 MED ORDER — IOHEXOL 350 MG/ML SOLN
100.0000 mL | Freq: Once | INTRAVENOUS | Status: AC | PRN
  Administered 2013-04-29: 100 mL via INTRAVENOUS

## 2013-08-29 ENCOUNTER — Encounter: Payer: Self-pay | Admitting: Vascular Surgery

## 2013-08-30 ENCOUNTER — Ambulatory Visit (INDEPENDENT_AMBULATORY_CARE_PROVIDER_SITE_OTHER): Payer: Medicare HMO | Admitting: Vascular Surgery

## 2013-08-30 ENCOUNTER — Encounter: Payer: Self-pay | Admitting: Vascular Surgery

## 2013-08-30 VITALS — BP 134/72 | HR 76 | Ht 72.0 in | Wt 164.0 lb

## 2013-08-30 DIAGNOSIS — I728 Aneurysm of other specified arteries: Secondary | ICD-10-CM

## 2013-08-30 NOTE — Progress Notes (Signed)
[facility] [place]  Referred by:  Gara Kroner, MD [address], [place], [place] [address]  Reason for referral: splenic artery aneurysm  History of Present Illness  [name] Galloway is a 79 y.o. (1935/02/04) male who presents with chief complaint: none.  The patient was recently hospitalized with bowel obstruction.  As part of that work up, an incidental SAA was discovered.  It was verified with CTA Abd/pelvis.  The patient denies any fever or chills.  He denies any LUQ or L flank pain.  He has had prior abd pain which was diagnosed as bowel obstruction due to a possible volvulus.  There is no family history of aneurysmal disease.  He is a distant smoker over 25 years ago.  His atherosclerotic risk factors included: hypercholesterolemia, HTN, and prior smoking.  Past Medical History  Diagnosis Date  . High cholesterol   . Hypertension     Past Surgical History  Procedure Laterality Date  . Colonoscopy  2011    Blockage  . Flexible sigmoidoscopy  09/20/2011    Procedure: FLEXIBLE SIGMOIDOSCOPY;  Surgeon: Jeryl Columbia, MD;  Location: [facility] ENDOSCOPY;  Service: Endoscopy;  Laterality: N/A;  . Hernia repair      History   Social History  . Marital Status: Married    Spouse Name: N/A    Number of Children: N/A  . Years of Education: N/A   Occupational History  . Not on file.   Social History Main Topics  . Smoking status: Former Smoker    Quit date: 09/19/1998  . Smokeless tobacco: Not on file  . Alcohol Use: No  . Drug Use: No  . Sexual Activity:    Other Topics Concern  . Not on file   Social History Narrative  . No narrative on file    Family History  Problem Relation Age of Onset  . Cancer Mother   . Hypertension Mother   . Cancer Father   . Cancer Sister   . Heart attack Son   . Heart disease Son     before age 14    Current Outpatient Prescriptions on File Prior to Visit  Medication Sig Dispense Refill  . aspirin 81  MG tablet Take 81 mg by mouth daily.      . fish oil-omega-3 fatty acids 1000 MG capsule Take 1 g by mouth daily.      . polyethylene glycol (MIRALAX / GLYCOLAX) packet Take 17 g by mouth 2 (two) times daily.  14 each  0  . potassium chloride (K-DUR) 10 MEQ tablet Take 2 tablets (20 mEq total) by mouth 2 (two) times daily.  30 tablet  0   No current facility-administered medications on file prior to visit.    Allergies  Allergen Reactions  . Ciprofloxacin    REVIEW OF SYSTEMS:  (Positives checked otherwise negative)  CARDIOVASCULAR:  []  chest pain, []  chest pressure, []  palpitations, []  shortness of breath when laying flat, []  shortness of breath with exertion,  []  pain in feet when walking, []  pain in feet when laying flat, []  history of blood clot in veins (DVT), []  history of phlebitis, []  swelling in legs, []  varicose veins  PULMONARY:  []  productive cough, []  asthma, []  wheezing  NEUROLOGIC:  []  weakness in arms or legs, []  numbness in arms or legs, []  difficulty speaking or slurred speech, []  temporary loss of vision in one eye, []  dizziness  HEMATOLOGIC:  []  bleeding problems, []  problems  with blood clotting too easily  MUSCULOSKEL:  []  joint pain, []  joint swelling  GASTROINTEST:  []  vomiting blood, []  blood in stool     GENITOURINARY:  []  burning with urination, []  blood in urine  PSYCHIATRIC:  []  history of major depression  INTEGUMENTARY:  []  rashes, []  ulcers  CONSTITUTIONAL:  []  fever, []  chills  Physical Examination  Filed Vitals:   08/30/13 0916  BP: 134/72  Pulse: 76  Height: 6' (1.829 m)  Weight: 164 lb (74.39 kg)  SpO2: 100%    Body mass index is 22.24 kg/(m^2).  General: A&O x 3, WDWN  Head: [place]/AT  Ear/Nose/Throat: Hearing grossly intact, nares w/o erythema or drainage, oropharynx w/o Erythema/Exudate, Mallampati score: 3  Eyes: PERRLA, EOMI  Neck: Supple, no nuchal rigidity, no palpable LAD  Pulmonary: Sym exp, good air movt, CTAB, no  rales, rhonchi, & wheezing  Cardiac: RRR, Nl S1, S2, no rubs or gallops, SEM  Vascular: Vessel Right Left  Radial Palpable Palpable  Ulnar Faintly Palpable Faintly Palpable  Brachial Palpable Palpable  Carotid Palpable, without bruit Palpable, without bruit  Aorta Not palpable N/A  Femoral Palpable Palpable  Popliteal Not palpable Not palpable  PT  Palpable  Palpable  DP  Palpable  Palpable   Gastrointestinal: soft, NTND, -G/R, - HSM, - masses, - CVAT B, no palpable AAA  Musculoskeletal: M/S 5/5 throughout , Extremities without ischemic changes , no palpable popliteal aneurysms  Neurologic: CN 2-12 intact , Pain and light touch intact in extremities , Motor exam as listed above  Psychiatric: Judgment intact, Mood & affect appropriate for pt's clinical situation  Dermatologic: See M/S exam for extremity exam, no rashes otherwise noted  Lymph : No Cervical, Axillary, or Inguinal lymphadenopathy   CTA Abd/pelvis (04/29/13)  Stable splenic artery aneurysm measures 12 mm. Given 1-year stability, consider repeat CTA in 2 years. If stable at that time, no further follow-up imaging would be required.   Based on my review of this patient's CTA, he has a small SAA with some thrombus vs. Plaque within the aneurysm.  Spleen and distal pancreas appear adequately perfuse.  No AAA is present.  Outside Studies/Documentation 6 pages of outside documents were reviewed including: outside clinic chart and CTA report.  Medical Decision Making  Sukhdeep Wieting is a 79 y.o. male who presents with: small asx SAA.   Based on the CTA, I would not be surprise if this SAA is actually a penetrating ulcer in the proximal SAA.  Regardless, I would treat it the same as a bland SAA.  Repair criteria is >2 cm or sx, though in a male elder patient with multiple co-morbidities some liberalization of the criteria might be indicated.    At this point, no surgical or endovascular intervention is indicated.  If  the SAA grows upon subsequent CTA in 2 years or the patient starts to develop embolization to his spleen, further work-up including celiac angiography and possible intervention would be indicated.  I discussed in depth with the patient the nature of atherosclerosis, and emphasized the importance of maximal medical management including strict control of blood pressure, blood glucose, and lipid levels, antiplatelet agents, obtaining regular exercise, and cessation of smoking.    I will order the CTA abd/pelvis in 2 years and he will follow up with [place] at that time.  Thank you for allowing [place] to participate in this patient's care.  Adele Barthel, MD [facility] [place] Office: [phone] Pager: [phone]  08/30/2013,  9:53 AM

## 2013-10-22 ENCOUNTER — Encounter: Payer: Self-pay | Admitting: Internal Medicine

## 2013-10-23 ENCOUNTER — Encounter: Payer: Self-pay | Admitting: Internal Medicine

## 2013-10-24 ENCOUNTER — Other Ambulatory Visit (INDEPENDENT_AMBULATORY_CARE_PROVIDER_SITE_OTHER): Payer: Medicare HMO

## 2013-10-24 ENCOUNTER — Encounter: Payer: Self-pay | Admitting: Internal Medicine

## 2013-10-24 ENCOUNTER — Ambulatory Visit (INDEPENDENT_AMBULATORY_CARE_PROVIDER_SITE_OTHER): Payer: Medicare HMO | Admitting: Internal Medicine

## 2013-10-24 VITALS — BP 110/70 | HR 72 | Ht 72.0 in | Wt 163.0 lb

## 2013-10-24 DIAGNOSIS — K562 Volvulus: Secondary | ICD-10-CM

## 2013-10-24 DIAGNOSIS — K529 Noninfective gastroenteritis and colitis, unspecified: Secondary | ICD-10-CM

## 2013-10-24 DIAGNOSIS — R197 Diarrhea, unspecified: Secondary | ICD-10-CM

## 2013-10-24 LAB — TSH: TSH: 0.88 u[IU]/mL (ref 0.35–5.50)

## 2013-10-24 LAB — COMPREHENSIVE METABOLIC PANEL
ALBUMIN: 3.8 g/dL (ref 3.5–5.2)
ALK PHOS: 51 U/L (ref 39–117)
ALT: 23 U/L (ref 0–53)
AST: 24 U/L (ref 0–37)
BUN: 30 mg/dL — ABNORMAL HIGH (ref 6–23)
CO2: 26 mEq/L (ref 19–32)
Calcium: 8.6 mg/dL (ref 8.4–10.5)
Chloride: 104 mEq/L (ref 96–112)
Creatinine, Ser: 1.2 mg/dL (ref 0.4–1.5)
GFR: 60.7 mL/min (ref >60.00)
GLUCOSE: 84 mg/dL (ref 70–99)
POTASSIUM: 3 meq/L — AB (ref 3.5–5.1)
SODIUM: 138 meq/L (ref 135–145)
TOTAL PROTEIN: 6.6 g/dL (ref 6.0–8.3)
Total Bilirubin: 1 mg/dL (ref 0.3–1.2)

## 2013-10-24 LAB — CBC
HCT: 35.9 % — ABNORMAL LOW (ref 39.0–52.0)
Hemoglobin: 12.1 g/dL — ABNORMAL LOW (ref 13.0–17.0)
MCHC: 33.6 g/dL (ref 30.0–36.0)
MCV: 87.6 fl (ref 78.0–100.0)
Platelets: 182 10*3/uL (ref 150.0–400.0)
RBC: 4.1 Mil/uL — AB (ref 4.22–5.81)
RDW: 14.9 % — AB (ref 11.5–14.6)
WBC: 5.9 10*3/uL (ref 4.5–10.5)

## 2013-10-24 MED ORDER — MOVIPREP 100 G PO SOLR: ORAL | Status: DC

## 2013-10-24 NOTE — Patient Instructions (Signed)
Your physician has requested that you go to the basement for the following lab work before leaving today:GI pathogen panel, CBC, CMP, TSH, Fecal elastase   You have been scheduled for a colonoscopy with propofol. Please follow written instructions given to you at your visit today.  Please pick up your prep kit at the pharmacy within the next 1-3 days. If you use inhalers (even only as needed), please bring them with you on the day of your procedure. Your physician has requested that you go to www.startemmi.com and enter the access code given to you at your visit today. This web site gives a general overview about your procedure. However, you should still follow specific instructions given to you by our office regarding your preparation for the procedure.

## 2013-10-24 NOTE — Progress Notes (Signed)
Patient ID: [name] Galloway, male   DOB: 03/18/1935, 79 y.o.   MRN: [id number] HPI: Mr. Nin is a 79 yo male with PMH of early dementia/mild cognitive impairment, sigmoid volvulus status post reduction by Dr. Watt Climes in 2013 with flex sig, hypertension, hyperlipidemia who is seen in consultation at the request of Dr. Moreen Fowler to evaluate encopresis and diarrhea. He is here today with his wife. He reports 5 months of near "constant diarrhea". This is nonbloody and non-melenic but does contain mucus. He is often unaware of having a bowel movement and for this reason he is wearing depends. He estimates 5-10 times daily and this can occur at night. He has tried fiber supplementation and probiotic, Align, without benefit. He denies abdominal pain. He has lost about 15 pounds. He reports he is eating well and has a great appetite. No nausea or vomiting. No heartburn. No trouble swallowing. No fevers or chills.  He does recall one or 2 colonoscopies prior to his sigmoid volvulus and does not recall colon polyps. They believed these were both done by Dr. Lizbeth Bark.  Denies a family history of IBD or colorectal cancer.  Past Medical History  Diagnosis Date  . High cholesterol   . Hypertension     Past Surgical History  Procedure Laterality Date  . Colonoscopy  2011    Blockage  . Flexible sigmoidoscopy  09/20/2011    Procedure: FLEXIBLE SIGMOIDOSCOPY;  Surgeon: Jeryl Columbia, MD;  Location: [facility] ENDOSCOPY;  Service: Endoscopy;  Laterality: N/A;  . Hernia repair      Current Outpatient Prescriptions  Medication Sig Dispense Refill  . aspirin 81 MG tablet Take 81 mg by mouth daily.      . Coenzyme Q10 (COQ10) 200 MG CAPS Take 1 capsule by mouth daily.      Marland Kitchen FIBER SELECT GUMMIES PO Take 1 capsule by mouth daily.      . fish oil-omega-3 fatty acids 1000 MG capsule Take 1 g by mouth daily.      . Memantine HCl (NAMENDA PO) Take 28 mg by mouth daily.       . Methylfol-Methylcob-Acetylcyst (CEREFOLIN  NAC) 6-2-600 MG TABS Take 1 tablet by mouth daily.      . Phosphatidylserine-DHA-EPA (VAYACOG) 100-19.5-6.5 MG CAPS Take 1 capsule by mouth daily.      . tamsulosin (FLOMAX) 0.4 MG CAPS capsule       . MOVIPREP 100 G SOLR Use per prep instruction  1 kit  0   No current facility-administered medications for this visit.    Allergies  Allergen Reactions  . Ciprofloxacin     Family History  Problem Relation Age of Onset  . Cancer Mother   . Hypertension Mother   . Cancer Father   . Cancer Sister   . Heart attack Son   . Heart disease Son     before age 68    History  Substance Use Topics  . Smoking status: Former Smoker    Quit date: 09/19/1998  . Smokeless tobacco: Never Used  . Alcohol Use: No    ROS: As per history of present illness, otherwise negative  BP 110/70  Pulse 72  Ht 6' (1.829 m)  Wt 163 lb (73.936 kg)  BMI 22.10 kg/m2 Constitutional: Well-developed and well-nourished. No distress. HEENT: Normocephalic and atraumatic. Oropharynx is clear and moist. No oropharyngeal exudate. Conjunctivae are normal.  No scleral icterus. Neck: Neck supple. Trachea midline. Cardiovascular: Normal rate, regular rhythm and intact distal pulses. 2/6 SEM  Pulmonary/chest: Effort normal and breath sounds normal. No wheezing, rales or rhonchi. Abdominal: Soft, nontender, nondistended. Bowel sounds active throughout.  Extremities: no clubbing, cyanosis, or edema Lymphadenopathy: No cervical adenopathy noted. Neurological: Alert and oriented to person place and time. Skin: Skin is warm and dry. No rashes noted. Psychiatric: Normal mood and affect. Behavior is normal. ____________________________________________________________________________________ CTA ABDOMEN, CT CHEST WITH CONTRAST -- Sept 2014   Technique:  Multidetector CT imaging through the abdomen was performed using the standard protocol during bolus administration of intravenous contrast.  Multiplanar reconstructed  images including MIPs were obtained and reviewed to evaluate the vascular anatomy.  Multidetector CT imaging through the chest was performed. Additional CT imaging of the abdomen was performed in the portal venous phase.   Contrast: 164m OMNIPAQUE IOHEXOL 350 MG/ML SOLN   Comparison:  Prior CT scan of the chest 04/30/2012; prior CT scan of the abdomen and pelvis 09/19/2011   CT CHEST   Findings:   Mediastinum: Unremarkable CT appearance of the thyroid gland.  No suspicious mediastinal or hilar adenopathy. Stable right hilar calcified nodes.  The No soft tissue mediastinal mass.  The thoracic esophagus is unremarkable.   Heart/Vascular: The heart is within normal limits for size.  There is dense calcification of the mitral valve annulus as well as calcification and thickening of the aortic valve leaflets. Atherosclerotic vascular calcifications noted throughout the coronary arteries.  No pericardial effusion.   Lungs/Pleura: Stable calcified right lower lobe pulmonary nodules measuring up to 9 mm.  The lungs are otherwise clear.  No pleural effusion.   Bones: No acute fracture or aggressive appearing lytic or blastic osseous lesion.   IMPRESSION:   1.  Continued stability calcified right lower lobe pulmonary nodules which are almost certainly calcified granulomas.  No further imaging followup is required.   2.  Atherosclerosis including multivessel coronary artery disease.   3.  Calcified and thickened aortic valve.  If there is clinical concern for underlying aortic valve stenosis or regurgitation, echocardiography could further evaluate.   CTA ABDOMEN AND PELVIS   Findings:   VASCULAR   Aorta:  Mild scattered atherosclerotic vascular calcification. Mild dilatation of the infrarenal segment without aneurysmal dilatation.   Celiac: Mild atherosclerotic calcification at the origin without significant stenosis.  Conventional hepatic arterial anatomy. Stable 12 x  11 mm aneurysm arising from the proximal splenic artery.  There is a scant peripheral thrombus which is new compared to prior PET likely not clinically significant.  The remainder of the arteries within normal limits.   SMA: Patent.  No acute abnormality.   Renals: Single dominant renal arteries bilaterally.  The right renal artery demonstrates trace atherosclerotic calcification and plaque at the origin without significant stenosis.  On the left, there is at least moderate narrowing secondary to partially calcified atherosclerotic plaque.   IMA: Patent.   Inflow: The visualized proximal common iliac arteries demonstrate scattered atherosclerotic calcifications without aneurysmal dilatation or stenosis.   Veins: No focal abnormality.   NON-VASCULAR   Abdomen: Unremarkable CT appearance of the stomach, duodenum, spleen, adrenal glands, pancreas and liver. Gallbladder is unremarkable. No intra or extrahepatic biliary ductal dilatation.   Symmetric renal parenchymal enhancement bilaterally.  No enhancing renal mass, nephrolithiasis or hydronephrosis.   Normal-caliber visualized large and small bowel throughout the abdomen.  No free fluid or suspicious adenopathy.   Bones: No acute fracture or aggressive appearing lytic or blastic osseous lesion.  Multilevel degenerative disc disease. Thoracolumbar facet arthropathy.      Review  of the MIP images confirms the above findings.   IMPRESSION:   Stable splenic artery aneurysm measures 12 mm. Given 1-year stability, consider repeat CTA in 2 years. If stable at that time, no further follow-up imaging would be required.   Signed,   Criselda Peaches, MD [facility]   --Flexible sigmoidoscopy 09/20/2011, Dr. Clarene Essex -- volvulus in the distal sigmoid status post decompression. There is no signs of ischemia noted.  ASSESSMENT/PLAN: 79 yo male with PMH of early  dementia/mild cognitive impairment, sigmoid volvulus status post reduction by Dr. Watt Climes in 2013 with flex sig, hypertension, hyperlipidemia who is seen in consultation at the request of Dr. Moreen Fowler to evaluate encopresis and diarrhea  1.  Chronic diarrhea with encopresis -- first I recommended stool studies to rule out infectious cause of diarrhea. I will also check a pancreatic fecal elastase. I would like to check labs to include CBC, CMP, TSH and celiac panel. If stool studies are negative I recommend colonoscopy. We discussed colonoscopy today including the risks and benefits and he is agreeable to proceed. If the mucosa is normal we will plan random biopsies for microscopic colitis.  His sigmoid volvulus history is noted, etiology uncertain. Subsequent CT in September 2014 did not show any obvious bowel abnormality. Further recommendations after labs, stool studies, and probable colonoscopy.

## 2013-10-25 ENCOUNTER — Other Ambulatory Visit: Payer: Self-pay

## 2013-10-25 ENCOUNTER — Other Ambulatory Visit: Payer: Medicare HMO

## 2013-10-25 DIAGNOSIS — R197 Diarrhea, unspecified: Secondary | ICD-10-CM

## 2013-10-25 LAB — GASTROINTESTINAL PATHOGEN PANEL PCR
C. DIFFICILE TOX A/B, PCR: NEGATIVE
CRYPTOSPORIDIUM, PCR: NEGATIVE
Campylobacter, PCR: NEGATIVE
E COLI (STEC) STX1/STX2, PCR: NEGATIVE
E coli (ETEC) LT/ST PCR: NEGATIVE
E coli 0157, PCR: NEGATIVE
GIARDIA LAMBLIA, PCR: NEGATIVE
Norovirus, PCR: NEGATIVE
ROTAVIRUS, PCR: NEGATIVE
SALMONELLA, PCR: NEGATIVE
SHIGELLA, PCR: NEGATIVE

## 2013-10-25 MED ORDER — POTASSIUM CHLORIDE CRYS ER 20 MEQ PO TBCR: 40.0000 meq | EXTENDED_RELEASE_TABLET | Freq: Every day | ORAL | Status: DC

## 2013-10-28 ENCOUNTER — Other Ambulatory Visit: Payer: Self-pay

## 2013-10-28 MED ORDER — DIPHENOXYLATE-ATROPINE 2.5-0.025 MG PO TABS: 1.0000 | ORAL_TABLET | Freq: Four times a day (QID) | ORAL | Status: DC | PRN

## 2013-10-30 ENCOUNTER — Other Ambulatory Visit: Payer: Self-pay

## 2013-10-30 ENCOUNTER — Encounter: Payer: Self-pay | Admitting: Internal Medicine

## 2013-10-30 ENCOUNTER — Ambulatory Visit (AMBULATORY_SURGERY_CENTER): Payer: Medicare HMO | Admitting: Internal Medicine

## 2013-10-30 VITALS — BP 129/66 | HR 59 | Temp 97.8°F | Resp 15 | Ht 72.0 in | Wt 163.0 lb

## 2013-10-30 DIAGNOSIS — Z538 Procedure and treatment not carried out for other reasons: Secondary | ICD-10-CM

## 2013-10-30 DIAGNOSIS — R197 Diarrhea, unspecified: Secondary | ICD-10-CM

## 2013-10-30 DIAGNOSIS — K562 Volvulus: Secondary | ICD-10-CM

## 2013-10-30 DIAGNOSIS — D126 Benign neoplasm of colon, unspecified: Secondary | ICD-10-CM

## 2013-10-30 MED ORDER — SODIUM CHLORIDE 0.9 % IV SOLN: 500.0000 mL | INTRAVENOUS | Status: DC

## 2013-10-30 NOTE — Patient Instructions (Signed)
Discharge instructions given with verbal understanding. Biopsies taken. CT SCAN will be schedule through office. Surgical consult arrange through office. Contrast given in recovery room. Resume previous medications. YOU HAD AN ENDOSCOPIC PROCEDURE TODAY AT [facility]: Refer to the procedure report that was given to you for any specific questions about what was found during the examination.  If the procedure report does not answer your questions, please call your gastroenterologist to clarify.  If you requested that your care partner not be given the details of your procedure findings, then the procedure report has been included in a sealed envelope for you to review at your convenience later.  YOU SHOULD EXPECT: Some feelings of bloating in the abdomen. Passage of more gas than usual.  Walking can help get rid of the air that was put into your GI tract during the procedure and reduce the bloating. If you had a lower endoscopy (such as a colonoscopy or flexible sigmoidoscopy) you may notice spotting of blood in your stool or on the toilet paper. If you underwent a bowel prep for your procedure, then you may not have a normal bowel movement for a few days.  DIET: Your first meal following the procedure should be a light meal and then it is ok to progress to your normal diet.  A half-sandwich or bowl of soup is an example of a good first meal.  Heavy or fried foods are harder to digest and may make you feel nauseous or bloated.  Likewise meals heavy in dairy and vegetables can cause extra gas to form and this can also increase the bloating.  Drink plenty of fluids but you should avoid alcoholic beverages for 24 hours.  ACTIVITY: Your care partner should take you home directly after the procedure.  You should plan to take it easy, moving slowly for the rest of the day.  You can resume normal activity the day after the procedure however you should NOT DRIVE or use heavy machinery for 24  hours (because of the sedation medicines used during the test).    SYMPTOMS TO REPORT IMMEDIATELY: A gastroenterologist can be reached at any hour.  During normal business hours, 8:30 AM to 5:00 PM Monday through Friday, call [phone].  After hours and on weekends, please call the GI answering service at [phone] who will take a message and have the physician on call contact you.   Following lower endoscopy (colonoscopy or flexible sigmoidoscopy):  Excessive amounts of blood in the stool  Significant tenderness or worsening of abdominal pains  Swelling of the abdomen that is new, acute  Fever of 100F or higher  FOLLOW UP: If any biopsies were taken you will be contacted by phone or by letter within the next 1-3 weeks.  Call your gastroenterologist if you have not heard about the biopsies in 3 weeks.  Our staff will call the home number listed on your records the next business day following your procedure to check on you and address any questions or concerns that you may have at that time regarding the information given to you following your procedure. This is a courtesy call and so if there is no answer at the home number and we have not heard from you through the emergency physician on call, we will assume that you have returned to your regular daily activities without incident.  SIGNATURES/CONFIDENTIALITY: You and/or your care partner have signed paperwork which will be entered into your electronic medical record.  These signatures  attest to the fact that that the information above on your After Visit Summary has been reviewed and is understood.  Full responsibility of the confidentiality of this discharge information lies with you and/or your care-partner.

## 2013-10-30 NOTE — Op Note (Signed)
[place]  [address]. [place], [address]   COLONOSCOPY PROCEDURE REPORT  PATIENT: Noel, Rodier  MR#: [id number] BIRTHDATE: March 29, 1935 , 78  yrs. old GENDER: Male ENDOSCOPIST: Jerene Bears, MD REFERRED XB:MWUXL Kim, M.D. PROCEDURE DATE:  10/30/2013 PROCEDURE:   Colonoscopy, incomplete and Colonoscopy with biopsy First Screening Colonoscopy - Avg.  risk and is 50 yrs.  old or older - No.  Prior Negative Screening - Now for repeat screening. N/A  History of Adenoma - Now for follow-up colonoscopy & has been > or = to 3 yrs.  N/A  Polyps Removed Today? No.  Recommend repeat exam, <10 yrs? Yes.  Inadequate prep. ASA CLASS:   Class III INDICATIONS:unexplained diarrhea and history of sigmoid volvulus Jan 2013. MEDICATIONS: MAC sedation, administered by CRNA and propofol (Diprivan) 100mg  IV  DESCRIPTION OF PROCEDURE:   After the risks benefits and alternatives of the procedure were thoroughly explained, informed consent was obtained.  A digital rectal exam revealed no rectal mass  with mild narrowing of the anal canal.   The [id number]  endoscope was introduced through the anus and advanced to the transverse colon. No adverse events experienced.   Limited by poor preparation.   The quality of the prep was poor, using MoviPrep  The instrument was then slowly withdrawn as the colon was fully examined.  COLON FINDINGS: Poor prep with liquid and solid stool.  Evidence of suspected chronic volvulus was found in the rectosigmoid colon. there was twisting of the rectosigmoid colon through which the scope passed easily with minimal resistance. Proximal to this twisted segment, the lumen was significantly dilated in the sigmoid colon with redundancy. There was no evidence of ischemia.  The colonic mucosa appeared normal in the distal transverse colon, descending colon, sigmoid colon, and rectum.  Multiple random biopsies of the area were performed.   Retroflexed views revealed no abnormalities.      The scope was withdrawn and the procedure completed.  COMPLICATIONS: There were no complications.  ENDOSCOPIC IMPRESSION: 1.   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   The lumen was dilated in the sigmoid colon with significant redundancy without evidence of ischemia 3.  The colonic mucosa appeared normal in the distal transverse colon, descending colon, sigmoid colon, and rectum; multiple random biopsies of the area were performed 4.    Incomplete colonoscopy due to poor preparation  RECOMMENDATIONS: 1.  Await biopsy results 2.  My office will arrange for you to have a CT scan of abdomen and pelvis. 3.  My office will arrange for you to meet with a surgeon. 4.  You will receive a letter within 1-2 weeks with the results of your biopsy as well as final recommendations.  Please call my office if you have not received a letter after 3 weeks.   eSigned:  Jerene Bears, MD 10/30/2013 9:08 AM   cc: The Patient and Jani Gravel, MD   PATIENT NAME:  Fredie, Majano MR#: [id number]

## 2013-10-30 NOTE — Progress Notes (Signed)
Called to room to assist during endoscopic procedure.  Patient ID and intended procedure confirmed with present staff. Received instructions for my participation in the procedure from the performing physician.  

## 2013-10-30 NOTE — Progress Notes (Signed)
Lidocaine-40mg IV prior to Propofol InductionPropofol given over incremental dosages 

## 2013-10-31 ENCOUNTER — Telehealth: Payer: Self-pay

## 2013-10-31 NOTE — Telephone Encounter (Signed)
Left message on answering machine. 

## 2013-11-04 ENCOUNTER — Ambulatory Visit (INDEPENDENT_AMBULATORY_CARE_PROVIDER_SITE_OTHER)
Admission: RE | Admit: 2013-11-04 | Discharge: 2013-11-04 | Disposition: A | Discharge status: Home / Self Care | Payer: Medicare HMO | Source: Ambulatory Visit | Attending: Internal Medicine | Admitting: Internal Medicine

## 2013-11-04 ENCOUNTER — Telehealth: Payer: Self-pay | Admitting: Internal Medicine

## 2013-11-04 DIAGNOSIS — K562 Volvulus: Secondary | ICD-10-CM

## 2013-11-04 DIAGNOSIS — R197 Diarrhea, unspecified: Secondary | ICD-10-CM

## 2013-11-04 MED ORDER — IOHEXOL 300 MG/ML  SOLN
100.0000 mL | Freq: Once | INTRAMUSCULAR | Status: AC | PRN
  Administered 2013-11-04: 100 mL via INTRAVENOUS

## 2013-11-04 NOTE — Telephone Encounter (Signed)
Note incomplete: I left him a message asking him to call me back and letting him know I was checking on him after CT today If I do not hear from him tonight, I will call him again tomorrow

## 2013-11-04 NOTE — Telephone Encounter (Signed)
Dr. Henrene Pastor, on call MD, received CT report by phone and contacted me CT abd/pelvis shows sigmoid volvulus, which is not unexpected after recent colonoscopy.  Pt is an outpatient and colonoscopy last week showed suspected chronic sigmoid volvulus.  CT was ordered to confirm. He has been have diarrhea, now known to likely be overflow in the setting of chronic volvulus. He has an outpt appt with general surgery No need for urgent procedure given the fact this is chronic, unless the pt developed symptoms of obstruction or compromised blood flow (pain, bleeding, obstruction). I

## 2013-11-05 NOTE — Telephone Encounter (Signed)
I spoke with the patient and reviewed the results and the importance of keeping surgical appt with Dr. Francetta Found on 11/13/13 10:35. He states still having diarrhea- I explained that this will not resolve until surgery per Dr. Hilarie Fredrickson. He denies any other complaints today nausea, vomiting, pain, etc. - he knows to call me back if he does develop any of these symptoms

## 2013-11-06 ENCOUNTER — Encounter: Payer: Self-pay | Admitting: Internal Medicine

## 2013-11-12 ENCOUNTER — Ambulatory Visit (INDEPENDENT_AMBULATORY_CARE_PROVIDER_SITE_OTHER): Payer: Medicare Other | Admitting: General Surgery

## 2013-11-13 ENCOUNTER — Encounter (INDEPENDENT_AMBULATORY_CARE_PROVIDER_SITE_OTHER): Payer: Self-pay | Admitting: General Surgery

## 2013-11-13 ENCOUNTER — Ambulatory Visit (INDEPENDENT_AMBULATORY_CARE_PROVIDER_SITE_OTHER): Payer: Medicare HMO | Admitting: General Surgery

## 2013-11-13 ENCOUNTER — Encounter (INDEPENDENT_AMBULATORY_CARE_PROVIDER_SITE_OTHER): Payer: Self-pay

## 2013-11-13 VITALS — BP 124/76 | HR 75 | Temp 98.5°F | Ht 73.0 in | Wt 161.0 lb

## 2013-11-13 DIAGNOSIS — Z01818 Encounter for other preprocedural examination: Secondary | ICD-10-CM

## 2013-11-13 DIAGNOSIS — K562 Volvulus: Secondary | ICD-10-CM

## 2013-11-13 MED ORDER — METRONIDAZOLE 500 MG PO TABS: 500.0000 mg | ORAL_TABLET | ORAL | Status: DC

## 2013-11-13 NOTE — Patient Instructions (Signed)
[facility]  ONE-DAY (1) PRE-OP HOME COLON PREP INSTRUCTIONS: ** MIRALAX / GATORADE PREP / FLAGYL**  You must follow the instructions below carefully.  If you have questions or problems, please call and speak to someone in the clinic department at our office:   [phone].     INSTRUCTIONS: 1. Five days prior to your procedure do not eat nuts, popcorn, or fruit with seeds.  Stop all fiber supplements such as Metamucil, Citrucel, etc. 2. Two days before surgery fill the prescription at a pharmacy of your choice and purchase the additional supplies below.         [facility] :   Purchase a bottle of MIRALAX  (255 gm bottle)    Purchase one 64 oz GATORADE.  (Do NOT purchase red Gatorade; any other flavor is acceptable) and place in refrigerator to get cold.  3.   Day Before Surgery:   6 am: Wash you abdomen with soap and repeat this on the morning of surgery   You may only have clear liquids (tea, coffee, juice, broth, jello, soft drinks, gummy bears).  You cannot have solid foods, cream, milk or milk products.  Drink at lease 8 ounces of liquids every hour while awake.   Take the Flagyl prescription as directed at 8 am, 2 pm and 8 pm.  It is helpful to take this with some jello instead of on an empty stomach.  Any flavor is ok, except red jello, which will cause red stools.   Mix the entire bottle of MiraLax and the Gatorade in a large container.    10:00am: Begin drinking the Gatorade mixture until gone (8 oz every 15-30 minutes).      You may suck on a lime wedge or hard candy to "freshen your palate" in between glasses   If you are a diabetic, take your blood sugar reading several time throughout the prep.  Have some juice available to take if your sugar level gets too low   You may feel chilled while taking the prep.  Have some warm tea or broth to help warm up.   Continue clear liquids until midnight or bedtime  3. The day of your procedure:   Do not eat or drink  ANYTHING after midnight before your surgery.     If you take Heart or Blood Pressure medicine, ask the pre-op nurses about these during your preop appointment.   Further pre-operative instructions will be given to you from the hospital.   Expect to be contacted 5-7 days before your surgery.

## 2013-11-13 NOTE — Progress Notes (Signed)
Chief Complaint  Patient presents with  . eval cecal volvulous    HISTORY: [name] Galloway is a 79 y.o. male who presents to the office with a chronic sigmoid volvulus that has presented as chronic diarrhea.  He denies any rectal bleeding or abd pain.  This was found by Dr Hilarie Fredrickson on work up for diarrhea.  Workup thus far has included CT scan.  His colonoscopy 3 weeks ago showed a tortuous colon with no masses.  Random biopsies did not show microscopic colitis.       Past Medical History  Diagnosis Date  . High cholesterol   . Hypertension       Past Surgical History  Procedure Laterality Date  . Colonoscopy  2011    Blockage  . Flexible sigmoidoscopy  09/20/2011    Procedure: FLEXIBLE SIGMOIDOSCOPY;  Surgeon: Jeryl Columbia, MD;  Location: [facility];  Service: Endoscopy;  Laterality: N/A;  . Hernia repair        Current Outpatient Prescriptions  Medication Sig Dispense Refill  . aspirin 81 MG tablet Take 81 mg by mouth daily.      . Coenzyme Q10 (COQ10) 200 MG CAPS Take 1 capsule by mouth daily.      Marland Kitchen FIBER SELECT GUMMIES PO Take 1 capsule by mouth daily.      . fish oil-omega-3 fatty acids 1000 MG capsule Take 1 g by mouth daily.      . Memantine HCl (NAMENDA PO) Take 28 mg by mouth daily.       . potassium chloride SA (K-DUR,KLOR-CON) 20 MEQ tablet Take 2 tablets (40 mEq total) by mouth daily.  14 tablet  0  . tamsulosin (FLOMAX) 0.4 MG CAPS capsule       . diphenoxylate-atropine (LOMOTIL) 2.5-0.025 MG per tablet Take 1 tablet by mouth 4 (four) times daily as needed for diarrhea or loose stools.  30 tablet  0  . Methylfol-Methylcob-Acetylcyst (CEREFOLIN NAC) 6-2-600 MG TABS Take 1 tablet by mouth daily.      . metroNIDAZOLE (FLAGYL) 500 MG tablet Take 1 tablet (500 mg total) by mouth as directed.  3 tablet  0  . Phosphatidylserine-DHA-EPA (VAYACOG) 100-19.5-6.5 MG CAPS Take 1 capsule by mouth daily.       No current facility-administered medications for this visit.       Allergies  Allergen Reactions  . Ciprofloxacin       Family History  Problem Relation Age of Onset  . Cancer Mother   . Hypertension Mother   . Cancer Father   . Cancer Sister   . Heart attack Son   . Heart disease Son     before age 2      History   Social History  . Marital Status: Married    Spouse Name: N/A    Number of Children: 2  . Years of Education: N/A   Occupational History  . Retired Paediatric nurse   Social History Main Topics  . Smoking status: Former Smoker    Quit date: 09/19/1998  . Smokeless tobacco: Never Used  . Alcohol Use: No  . Drug Use: No  . Sexual Activity: None   Other Topics Concern  . None   Social History Narrative  . None       REVIEW OF SYSTEMS - PERTINENT POSITIVES ONLY: Review of Systems - General ROS: negative for - chills or fever Respiratory ROS: no cough, shortness of breath, or wheezing Cardiovascular ROS: no chest pain or dyspnea on exertion Gastrointestinal  ROS: positive for - diarrhea negative for - abdominal pain, blood in stools or change in bowel habits Genito-Urinary ROS: no dysuria, trouble voiding, or hematuria  EXAM: Filed Vitals:   11/13/13 1041  BP: 124/76  Pulse: 75  Temp: 98.5 F (36.9 C)     Gen:  No acute distress.  Well nourished and well groomed.   Neurological: Alert and oriented to person, place, and time. Coordination normal.  Head: Normocephalic and atraumatic.  Eyes: Conjunctivae are normal. Pupils are equal, round, and reactive to light. No scleral icterus.  Neck: Normal range of motion. Neck supple. No tracheal deviation or thyromegaly present.   Cardiovascular: Normal rate, regular rhythm, normal heart sounds and intact distal pulses.   Respiratory: Effort normal.  No respiratory distress. No chest wall tenderness. Breath sounds normal.  No wheezes, rales or rhonchi.  GI: Soft. Bowel sounds are normal. The abdomen is soft and nontender.  There is no rebound and no guarding.   Musculoskeletal: Normal range of motion. Extremities are nontender.  Skin: Skin is warm and dry. No rash noted. No diaphoresis. No erythema. No pallor. No clubbing, cyanosis, or edema.   Psychiatric: Normal mood and affect. Behavior is normal. Judgment and thought content normal.     LABORATORY RESULTS: Available labs are reviewed  Lab Results  Component Value Date   WBC 5.9 10/24/2013   HGB 12.1* 10/24/2013   HCT 35.9* 10/24/2013   MCV 87.6 10/24/2013   PLT 182.0 10/24/2013   Lab Results  Component Value Date   CREATININE 1.2 10/24/2013   Alb: 3.8  RADIOLOGY RESULTS:   Images and reports are reviewed. IMPRESSION:  Dilated loops of mid sigmoid colon, measuring up to 12.4 cm, with suspected sigmoid volvulus.  Trace pelvic ascites. No pneumatosis or free air. No findings to suggest cecal volvulus.     ASSESSMENT AND PLAN: [name] Galloway Is a 79 year old male who has a history of chronic diarrhea. His main complaint is urgency related to this diarrhea. On CT scan and colonoscopy he has findings concerning for a chronic sigmoid volvulus. He has no signs of acute intestinal ischemia. I think it is reasonable to proceed with an elective colectomy. I will have him see his primary care physician Dr. Maudie Mercury for preoperative evaluation. Once this is complete we will schedule him for a laparoscopic surgical resection. He was given instructions on his bowel prep and preoperative diet. We will try to get this scheduled as soon as possible. The surgery and anatomy were described to the patient as well as the risks of surgery and the possible complications. These include: Bleeding, infection and possible wound complications such as hernia, damage to adjacent structures, leak of surgical connections, which can lead to other surgeries and possibly an ostomy (5-7%), possible need for other procedures, such as abscess drains in radiology, possible prolonged hospital stay, possible diarrhea from removal of part of  the colon, possible constipation from narcotics, prolonged fatigue/weakness or appetite loss, possible early recurrence, possible complications of their medical problems such as heart disease or arrhythmias or lung problems, death (less than 1%). I believe the patient understands and wishes to proceed with the surgery.     Rosario Adie, MD Colon and Rectal Surgery / [facility]      Visit Diagnoses: 1. Pre-op evaluation     Primary Care Physician: Jani Gravel, MD

## 2013-11-29 ENCOUNTER — Telehealth (INDEPENDENT_AMBULATORY_CARE_PROVIDER_SITE_OTHER): Payer: Self-pay | Admitting: General Surgery

## 2013-11-29 NOTE — Telephone Encounter (Signed)
Pt wife called in to find out about his surgery / states he has some memory loss/ told her it appeared he needed medical clearance from Dr Maudie Mercury PCP / she will call their office

## 2013-12-02 NOTE — Telephone Encounter (Signed)
According to my note on 3/25, the pt was supposed to see his primary care physician Dr. Maudie Mercury for preoperative evaluation. Please follow up on this and let me know when he is clear for surgery.

## 2013-12-04 ENCOUNTER — Encounter (INDEPENDENT_AMBULATORY_CARE_PROVIDER_SITE_OTHER): Payer: Self-pay

## 2013-12-12 ENCOUNTER — Telehealth (INDEPENDENT_AMBULATORY_CARE_PROVIDER_SITE_OTHER): Payer: Self-pay | Admitting: General Surgery

## 2013-12-12 ENCOUNTER — Encounter (HOSPITAL_COMMUNITY): Payer: Self-pay | Admitting: Pharmacy Technician

## 2013-12-12 ENCOUNTER — Encounter: Payer: Self-pay | Admitting: Interventional Cardiology

## 2013-12-12 NOTE — Telephone Encounter (Signed)
Patient's wife called in and explained that she just received a letter explaining that her husband needs to see his PCP for medical evaluation preoperatively before his surgery date.  The surgery date is scheduled for 12/26/13 and his appt with his PCP isn't until 12/25/13.  The patient's wife does not believe that this will give adequate time for information to be processed.  Informed her to call their office first and explain what is going on and see if they would be willing to work him in for a medical clearance.  Informed her that if they cannot, then we can rescheduled the surgery as a last result.  She verbalized understanding.

## 2013-12-17 ENCOUNTER — Telehealth: Payer: Self-pay | Admitting: Interventional Cardiology

## 2013-12-17 NOTE — Telephone Encounter (Signed)
Pt notified. Surgery is scheduled for next week, therefore I worked pt in this week for appt.

## 2013-12-17 NOTE — Telephone Encounter (Signed)
To Dr. Irish Lack, please advise. Pt last seen 5/14.

## 2013-12-17 NOTE — Telephone Encounter (Signed)
He will need to be seen in the office.  

## 2013-12-17 NOTE — Telephone Encounter (Signed)
New problem   Pt's wife stated pt is having colon surgery and need a surgical clearance. Please advise pt's wife.

## 2013-12-18 ENCOUNTER — Ambulatory Visit (INDEPENDENT_AMBULATORY_CARE_PROVIDER_SITE_OTHER): Payer: Medicare HMO | Admitting: Interventional Cardiology

## 2013-12-18 ENCOUNTER — Encounter (HOSPITAL_COMMUNITY): Payer: Self-pay

## 2013-12-18 ENCOUNTER — Encounter (HOSPITAL_COMMUNITY)
Admission: RE | Admit: 2013-12-18 | Discharge: 2013-12-18 | Disposition: A | Discharge status: Home / Self Care | Payer: Medicare HMO | Source: Ambulatory Visit | Attending: General Surgery | Admitting: General Surgery

## 2013-12-18 ENCOUNTER — Encounter: Payer: Self-pay | Admitting: Interventional Cardiology

## 2013-12-18 VITALS — BP 140/82 | HR 67 | Ht 73.0 in | Wt 157.4 lb

## 2013-12-18 DIAGNOSIS — F0390 Unspecified dementia without behavioral disturbance: Secondary | ICD-10-CM

## 2013-12-18 DIAGNOSIS — E782 Mixed hyperlipidemia: Secondary | ICD-10-CM | POA: Insufficient documentation

## 2013-12-18 DIAGNOSIS — I10 Essential (primary) hypertension: Secondary | ICD-10-CM

## 2013-12-18 DIAGNOSIS — I350 Nonrheumatic aortic (valve) stenosis: Secondary | ICD-10-CM

## 2013-12-18 DIAGNOSIS — Z01810 Encounter for preprocedural cardiovascular examination: Secondary | ICD-10-CM

## 2013-12-18 DIAGNOSIS — I359 Nonrheumatic aortic valve disorder, unspecified: Secondary | ICD-10-CM

## 2013-12-18 DIAGNOSIS — Z01812 Encounter for preprocedural laboratory examination: Secondary | ICD-10-CM | POA: Insufficient documentation

## 2013-12-18 HISTORY — DX: Unspecified urinary incontinence: R32

## 2013-12-18 HISTORY — DX: Volvulus: K56.2

## 2013-12-18 HISTORY — DX: Anemia, unspecified: D64.9

## 2013-12-18 HISTORY — DX: Atherosclerotic heart disease of native coronary artery without angina pectoris: I25.10

## 2013-12-18 HISTORY — DX: Full incontinence of feces: R15.9

## 2013-12-18 HISTORY — DX: Psoriasis, unspecified: L40.9

## 2013-12-18 HISTORY — DX: Unspecified dementia, unspecified severity, without behavioral disturbance, psychotic disturbance, mood disturbance, and anxiety: F03.90

## 2013-12-18 LAB — CBC
HCT: 35.1 % — ABNORMAL LOW (ref 39.0–52.0)
Hemoglobin: 12.3 g/dL — ABNORMAL LOW (ref 13.0–17.0)
MCH: 30 pg (ref 26.0–34.0)
MCHC: 35 g/dL (ref 30.0–36.0)
MCV: 85.6 fL (ref 78.0–100.0)
PLATELETS: 142 10*3/uL — AB (ref 150–400)
RBC: 4.1 MIL/uL — AB (ref 4.22–5.81)
RDW: 13.6 % (ref 11.5–15.5)
WBC: 5.2 10*3/uL (ref 4.0–10.5)

## 2013-12-18 LAB — BASIC METABOLIC PANEL
BUN: 29 mg/dL — ABNORMAL HIGH (ref 6–23)
CALCIUM: 8.8 mg/dL (ref 8.4–10.5)
CO2: 27 mEq/L (ref 19–32)
Chloride: 100 mEq/L (ref 96–112)
Creatinine, Ser: 1.2 mg/dL (ref 0.50–1.35)
GFR calc Af Amer: 66 mL/min — ABNORMAL LOW (ref >90)
GFR, EST NON AFRICAN AMERICAN: 57 mL/min — AB (ref >90)
Glucose, Bld: 89 mg/dL (ref 70–99)
Potassium: 3.9 mEq/L (ref 3.7–5.3)
SODIUM: 138 meq/L (ref 137–147)

## 2013-12-18 NOTE — Patient Instructions (Signed)
Your physician recommends that you continue on your current medications as directed. Please refer to the Current Medication list given to you today.  Your physician wants you to follow-up in: 1 year with Dr. Varanasi. You will receive a reminder letter in the mail two months in advance. If you don't receive a letter, please call our office to schedule the follow-up appointment.  

## 2013-12-18 NOTE — Progress Notes (Signed)
Patient ID: [name] Galloway, male   DOB: December 02, 1934, 79 y.o.   MRN: [id number]    [place], [place] [place], [place]  [address] Phone: [phone] Fax:  [phone]  Date:  12/18/2013   ID:  [name] Galloway, DOB 1935/06/26, MRN [id number]  PCP:  Jani Gravel, MD      History of Present Illness: [name] Galloway is a 79 y.o. male who has had moderate to severe AS in the past, last echo in 2013. He had a colonic obstruction in 2/13. He had a procedure to correct this. It was not an open repair. He has some urinary incontinence with position changes.  He no longer works at [employer]. Prior chest xray revealed coronary calcification and aortic atherosclerosis.  He has not had any chest discomfort or shortness of breath. He requires colon Surgery to correct the obstruction he had back in 2013.    Wt Readings from Last 3 Encounters:  12/18/13 157 lb 6.4 oz (71.396 kg)  11/13/13 161 lb (73.029 kg)  10/30/13 163 lb (73.936 kg)     Past Medical History  Diagnosis Date  . High cholesterol   . Hypertension     Current Outpatient Prescriptions  Medication Sig Dispense Refill  . aspirin 81 MG tablet Take 81 mg by mouth daily.      . Coenzyme Q10 (COQ10) 200 MG CAPS Take 1 capsule by mouth daily.      . diphenoxylate-atropine (LOMOTIL) 2.5-0.025 MG per tablet Take 1 tablet by mouth 4 (four) times daily as needed for diarrhea or loose stools.  30 tablet  0  . Memantine HCl ER (NAMENDA XR) 28 MG CP24 Take 1 capsule by mouth daily.      . Phosphatidylserine-DHA-EPA (VAYACOG) 100-19.5-6.5 MG CAPS Take 1 capsule by mouth daily.      . potassium chloride SA (K-DUR,KLOR-CON) 20 MEQ tablet Take 2 tablets (40 mEq total) by mouth daily.  14 tablet  0  . tamsulosin (FLOMAX) 0.4 MG CAPS capsule Take 0.4 mg by mouth daily.        No current facility-administered medications for this visit.    Allergies:    Allergies  Allergen Reactions  . Ciprofloxacin     Social History:  The patient   reports that he quit smoking about 15 years ago. He has never used smokeless tobacco. He reports that he does not drink alcohol or use illicit drugs.   Family History:  The patient's family history includes Cancer in his father, mother, and sister; Heart attack in his son; Heart disease in his son; Hypertension in his mother.   ROS:  Please see the history of present illness.  No nausea, vomiting.  No fevers, chills.  No focal weakness.  No dysuria. Abdominal pain.    All other systems reviewed and negative.   PHYSICAL EXAM: VS:  BP 140/82  Pulse 67  Ht 6\' 1"  (1.854 m)  Wt 157 lb 6.4 oz (71.396 kg)  BMI 20.77 kg/m2 Well nourished, well developed, in no acute distress HEENT: normal Neck: no JVD, no carotid bruits; normal carotid upstroke Cardiac:  normal S1, S2; RRR; III/VI systolic murmur Lungs:  clear to auscultation bilaterally, no wheezing, rhonchi or rales Abd: soft, nontender, no hepatomegaly Ext: no edema Skin: warm and dry Neuro:   no focal abnormalities noted  EKG:  NSR, LBBB     ASSESSMENT AND PLAN:  Aortic valve disorders  IMAGING: EKG   [name] Galloway,[name] Galloway 01/15/2013 11:01:59 AM > [name] Galloway,[name] Galloway 01/15/2013 11:19:49  AM > NSR, LBBB   Notes: No sx of severe AS. Moderate to severe by prior echo in 2013. He will let us know if he has lightheadedness or sx of angina or CHF.  2. Hypertension, essential  Notes: Controlled after significant weight loss.  3. Hyperlipemia, mixed  Notes: Lipids controlled. LDL 125 in 6/14 off of statin. Wants to stay off of statin due to memory issues, so would recommend healthier diet. Avoid fatty foods.  Wife feels dementia is getting worse.  He has stop working. He no longer drives by himself. 4. Preoperative evaluation: No further cardiac testing required for surgery. He believes this is an outpatient surgery.  The anesthesiologist should be aware of his moderate to severe aortic stenosis. Patient is still active. He walks up hills and walks up stairs  without difficulty. He works in his yard without any trouble.  LBBB: Chronic, noted for many years by ECG.   Signed, [name] Marble, MD, [facility] 12/18/2013 9:43 AM

## 2013-12-18 NOTE — Patient Instructions (Addendum)
[name] Galloway  12/18/2013                           YOUR PROCEDURE IS SCHEDULED ON: 12/26/13 AT 11:30 AM               PLEASE REPORT TO SHORT STAY CENTER AT :  9:30 AM               CALL THIS NUMBER IF ANY PROBLEMS THE DAY OF SURGERY :               [phone]                                REMEMBER:   Do not eat food or drink liquids AFTER MIDNIGHT                  Take these medicines the morning of surgery with               A SIPS OF WATER :     NAMENDA / VAYACOG / FLOMAX     Do not wear jewelry, make-up   Do not wear lotions, powders, or perfumes.   Do not shave legs or underarms 12 hrs. before surgery (men may shave face)  Do not bring valuables to the hospital.  Contacts, dentures or bridgework may not be worn into surgery.  Leave suitcase in the car. After surgery it may be brought to your room.  For patients admitted to the hospital more than one night, checkout time is            11:00 AM                                                       The day of discharge.   Patients discharged the day of surgery will not be allowed to drive home.            If going home same day of surgery, must have someone stay with you              FIRST 24 hrs at home and arrange for some one to drive you              home from hospital.   ________________________________________________________________________                                                          [facility] - Preparing for Surgery Before surgery, you can play an important role.  Because skin is not sterile, your skin needs to be as free of germs as possible.  You can reduce the number of germs on your skin by washing with CHG (chlorahexidine gluconate) soap before surgery.  CHG is an antiseptic cleaner which kills germs and bonds with the skin to continue killing germs even after washing. Please DO NOT use if you have an allergy to CHG or antibacterial soaps.  If your skin becomes reddened/irritated  stop using the CHG and inform your nurse when you arrive at Short  Stay. Do not shave (including legs and underarms) for at least 48 hours prior to the first CHG shower.  You may shave your face. Please follow these instructions carefully:  1.  Shower with CHG Soap the night before surgery and the  morning of Surgery.   2.  If you choose to wash your hair, wash your hair first as usual with your  normal  Shampoo.   3.  After you shampoo, rinse your hair and body thoroughly to remove the  shampoo.                                         4.  Use CHG as you would any other liquid soap.  You can apply chg directly  to the skin and wash . Gently wash with scrungie or clean wascloth    5.  Apply the CHG Soap to your body ONLY FROM THE NECK DOWN.   Do not use on open                           Wound or open sores. Avoid contact with eyes, ears mouth and genitals (private parts).                        Genitals (private parts) with your normal soap.              6.  Wash thoroughly, paying special attention to the area where your surgery  will be performed.   7.  Thoroughly rinse your body with warm water from the neck down.   8.  DO NOT shower/wash with your normal soap after using and rinsing off  the CHG Soap .                9.  Pat yourself dry with a clean towel.             10.  Wear clean pajamas.             11.  Place clean sheets on your bed the night of your first shower and do not  sleep with pets.  Day of Surgery : Do not apply any lotions/deodorants the morning of surgery.  Please wear clean clothes to the hospital/surgery center.  FAILURE TO FOLLOW THESE INSTRUCTIONS MAY RESULT IN THE CANCELLATION OF YOUR SURGERY    PATIENT SIGNATURE_________________________________    WHAT IS A BLOOD TRANSFUSION? Blood Transfusion Information  A transfusion is the replacement of blood or some of its parts. Blood is made up of multiple cells which provide different functions.  Red  blood cells carry oxygen and are used for blood loss replacement.  White blood cells fight against infection.  Platelets control bleeding.  Plasma helps clot blood.  Other blood products are available for specialized needs, such as hemophilia or other clotting disorders. BEFORE THE TRANSFUSION  Who gives blood for transfusions?   Healthy volunteers who are fully evaluated to make sure their blood is safe. This is blood bank blood. Transfusion therapy is the safest it has ever been in the practice of medicine. Before blood is taken from a donor, a complete history is taken to make sure that person has no history of diseases nor engages in risky social behavior (examples are intravenous drug use or sexual activity with multiple partners). The donor's  travel history is screened to minimize risk of transmitting infections, such as malaria. The donated blood is tested for signs of infectious diseases, such as HIV and hepatitis. The blood is then tested to be sure it is compatible with you in order to minimize the chance of a transfusion reaction. If you or a relative donates blood, this is often done in anticipation of surgery and is not appropriate for emergency situations. It takes many days to process the donated blood. RISKS AND COMPLICATIONS Although transfusion therapy is very safe and saves many lives, the main dangers of transfusion include:   Getting an infectious disease.  Developing a transfusion reaction. This is an allergic reaction to something in the blood you were given. Every precaution is taken to prevent this. The decision to have a blood transfusion has been considered carefully by your caregiver before blood is given. Blood is not given unless the benefits outweigh the risks. AFTER THE TRANSFUSION  Right after receiving a blood transfusion, you will usually feel much better and more energetic. This is especially true if your red blood cells have gotten low (anemic). The  transfusion raises the level of the red blood cells which carry oxygen, and this usually causes an energy increase.  The nurse administering the transfusion will monitor you carefully for complications. HOME CARE INSTRUCTIONS  No special instructions are needed after a transfusion. You may find your energy is better. Speak with your caregiver about any limitations on activity for underlying diseases you may have. SEEK MEDICAL CARE IF:   Your condition is not improving after your transfusion.  You develop redness or irritation at the intravenous (IV) site. SEEK IMMEDIATE MEDICAL CARE IF:  Any of the following symptoms occur over the next 12 hours:  Shaking chills.  You have a temperature by mouth above 102 F (38.9 C), not controlled by medicine.  Chest, back, or muscle pain.  People around you feel you are not acting correctly or are confused.  Shortness of breath or difficulty breathing.  Dizziness and fainting.  You get a rash or develop hives.  You have a decrease in urine output.  Your urine turns a dark color or changes to pink, red, or brown. Any of the following symptoms occur over the next 10 days:  You have a temperature by mouth above 102 F (38.9 C), not controlled by medicine.  Shortness of breath.  Weakness after normal activity.  The white part of the eye turns yellow (jaundice).  You have a decrease in the amount of urine or are urinating less often.  Your urine turns a dark color or changes to pink, red, or brown. Document Released: 08/05/2000 Document Revised: 10/31/2011 Document Reviewed: 03/24/2008 [facility] Patient Information 2014 [place], [place].  _______________________________________________________________________

## 2013-12-18 NOTE — Progress Notes (Signed)
12/18/13 1226  OBSTRUCTIVE SLEEP APNEA  Have you ever been diagnosed with sleep apnea through a sleep study? No  Do you snore loudly (loud enough to be heard through closed doors)?  1  Do you often feel tired, fatigued, or sleepy during the daytime? 0  Has anyone observed you stop breathing during your sleep? 1  Do you have, or are you being treated for high blood pressure? 0  BMI more than 35 kg/m2? 0  Age over 77 years old? 1  Neck circumference greater than 40 cm/16 inches? 0  Gender: 1  Obstructive Sleep Apnea Score 4  Score 4 or greater  Results sent to PCP

## 2013-12-19 ENCOUNTER — Ambulatory Visit: Payer: Medicare HMO | Admitting: Interventional Cardiology

## 2013-12-25 ENCOUNTER — Encounter (HOSPITAL_COMMUNITY): Payer: Medicare HMO | Admitting: Anesthesiology

## 2013-12-25 ENCOUNTER — Other Ambulatory Visit (HOSPITAL_COMMUNITY): Payer: Self-pay | Admitting: Anesthesiology

## 2013-12-25 ENCOUNTER — Inpatient Hospital Stay (HOSPITAL_COMMUNITY): Payer: Medicare HMO | Admitting: Anesthesiology

## 2013-12-25 DIAGNOSIS — R011 Cardiac murmur, unspecified: Secondary | ICD-10-CM

## 2013-12-25 NOTE — Progress Notes (Signed)
Preop evaluation I reviewed Mr. [name] Galloway's chart. Of great concern to me is his aortic stenosis that has not evaluated in 2 years. His planned surgery is an elective laparoscopic colonectomy for chronic diarrhea and chronic volvulus. He has required endoscopic reduction at least once before. I spoke at length with Dr. Marcello Moores about my concerns with this patient. At minimum he should have an echocardiogram prior to surgery. He is very likely not a candidate for elective surgery. Dr. Marcello Moores is on board. He may need to be evaluated by a cardiac surgeon for AVR. If he is not an AVR or TAVR candidate and the family wish to proceed with surgery, his risk for sudden cardiac death is very high.  Romina Divirgilio

## 2013-12-25 NOTE — Progress Notes (Signed)
12-25-13 [place] spouse Malachy Mood notified to have patient report to admitting 12-26-13 0745 AM to have Echocardiogram preop at 0800. After test done,patient could report to Short Stay. Test ordered per Dr. Lissa Hoard, anesthesiologist. Spouse stated she would have patient here.  Cardiovascular/ Ultrasound Vascular lab Department [phone]  "[name]" confirmed appointment scheduled and Tech "Nicole Kindred" would be available. W. Floy Sabina

## 2013-12-26 ENCOUNTER — Ambulatory Visit (HOSPITAL_COMMUNITY)
Admission: RE | Admit: 2013-12-26 | Discharge: 2013-12-26 | Disposition: A | Discharge status: Home / Self Care | Payer: Medicare HMO | Source: Ambulatory Visit | Attending: General Surgery | Admitting: General Surgery

## 2013-12-26 ENCOUNTER — Ambulatory Visit (HOSPITAL_COMMUNITY)
Admission: RE | Admit: 2013-12-26 | Discharge: 2013-12-26 | Disposition: A | Discharge status: Home / Self Care | Payer: Medicare HMO | Source: Ambulatory Visit | Attending: Anesthesiology | Admitting: Anesthesiology

## 2013-12-26 ENCOUNTER — Encounter (HOSPITAL_COMMUNITY): Payer: Self-pay | Admitting: *Deleted

## 2013-12-26 ENCOUNTER — Encounter (HOSPITAL_COMMUNITY)
Admission: RE | Disposition: A | Discharge status: Home / Self Care | Payer: Self-pay | Source: Ambulatory Visit | Attending: General Surgery

## 2013-12-26 ENCOUNTER — Ambulatory Visit: Payer: Medicare HMO | Admitting: Interventional Cardiology

## 2013-12-26 DIAGNOSIS — I350 Nonrheumatic aortic (valve) stenosis: Secondary | ICD-10-CM

## 2013-12-26 DIAGNOSIS — Z01810 Encounter for preprocedural cardiovascular examination: Secondary | ICD-10-CM | POA: Insufficient documentation

## 2013-12-26 DIAGNOSIS — K562 Volvulus: Secondary | ICD-10-CM | POA: Insufficient documentation

## 2013-12-26 DIAGNOSIS — R197 Diarrhea, unspecified: Secondary | ICD-10-CM | POA: Insufficient documentation

## 2013-12-26 DIAGNOSIS — I359 Nonrheumatic aortic valve disorder, unspecified: Secondary | ICD-10-CM

## 2013-12-26 DIAGNOSIS — R011 Cardiac murmur, unspecified: Secondary | ICD-10-CM

## 2013-12-26 LAB — ABO/RH: ABO/RH(D): A NEG

## 2013-12-26 LAB — TYPE AND SCREEN
ABO/RH(D): A NEG
ANTIBODY SCREEN: NEGATIVE

## 2013-12-26 SURGERY — LAPAROSCOPIC PARTIAL COLECTOMY
Anesthesia: General

## 2013-12-26 MED ORDER — DEXTROSE 5 % IV SOLN
2.0000 g | INTRAVENOUS | Status: DC
  Filled 2013-12-26: qty 2

## 2013-12-26 MED ORDER — PHENYLEPHRINE HCL 10 MG/ML IJ SOLN
INTRAMUSCULAR | Status: AC
  Filled 2013-12-26: qty 1

## 2013-12-26 MED ORDER — ROCURONIUM BROMIDE 100 MG/10ML IV SOLN
INTRAVENOUS | Status: AC
  Filled 2013-12-26: qty 1

## 2013-12-26 MED ORDER — ONDANSETRON HCL 4 MG/2ML IJ SOLN
INTRAMUSCULAR | Status: AC
  Filled 2013-12-26: qty 2

## 2013-12-26 MED ORDER — ALVIMOPAN 12 MG PO CAPS
12.0000 mg | ORAL_CAPSULE | Freq: Once | ORAL | Status: AC
  Administered 2013-12-26: 12 mg via ORAL
  Filled 2013-12-26: qty 1

## 2013-12-26 MED ORDER — FENTANYL CITRATE 0.05 MG/ML IJ SOLN
INTRAMUSCULAR | Status: AC
  Filled 2013-12-26: qty 5

## 2013-12-26 MED ORDER — PROPOFOL 10 MG/ML IV BOLUS
INTRAVENOUS | Status: AC
  Filled 2013-12-26: qty 20

## 2013-12-26 MED ORDER — LIDOCAINE HCL (CARDIAC) 20 MG/ML IV SOLN
INTRAVENOUS | Status: AC
  Filled 2013-12-26: qty 5

## 2013-12-26 NOTE — Anesthesia Preprocedure Evaluation (Deleted)
Anesthesia Evaluation  Patient identified by MRN, date of birth, ID band Patient awake    Reviewed: Allergy & Precautions, H&P , NPO status , Patient's Chart, lab work & pertinent test results  Airway Mallampati: II TM Distance: >3 FB Neck ROM: Full    Dental  (+) Dental Advisory Given   Pulmonary former smoker,  breath sounds clear to auscultation        Cardiovascular hypertension, Pt. on medications + CAD and + Peripheral Vascular Disease + Valvular Problems/Murmurs AS Rhythm:Regular Rate:Normal + Systolic murmurs    Neuro/Psych PSYCHIATRIC DISORDERS Dementianegative neurological ROS     GI/Hepatic negative GI ROS, Neg liver ROS,   Endo/Other  negative endocrine ROS  Renal/GU negative Renal ROS     Musculoskeletal negative musculoskeletal ROS (+)   Abdominal   Peds  Hematology  (+) anemia ,   Anesthesia Other Findings   Reproductive/Obstetrics                           Anesthesia Physical Anesthesia Plan  ASA: IV  Anesthesia Plan: General   Post-op Pain Management:    Induction: Intravenous  Airway Management Planned: Oral ETT  Additional Equipment: Arterial line  Intra-op Plan:   Post-operative Plan: Extubation in OR  Informed Consent: I have reviewed the patients History and Physical, chart, labs and discussed the procedure including the risks, benefits and alternatives for the proposed anesthesia with the patient or authorized representative who has indicated his/her understanding and acceptance.   Dental advisory given  Plan Discussed with: CRNA  Anesthesia Plan Comments:         Anesthesia Quick Evaluation

## 2013-12-26 NOTE — Progress Notes (Signed)
Echocardiogram 2D Echocardiogram has been performed.  [name] Galloway 12/26/2013, 8:42 AM

## 2013-12-26 NOTE — Progress Notes (Signed)
Preop Eval Update Preop echo showed significant progression of aortic valve disease. Though no detriment to LVSF yet, the LA is severely dilated. Due to his high risk of sudden cardiac death, I believe he should be referred to cardiothoracic surgery for evaluation for AVR. Dr. Marcello Moores and I together had a detailed discussion with the patient and his wife about these findings and our concerns. The expressed understanding and were onboard with referral for CT surgery evaluation.

## 2013-12-26 NOTE — Progress Notes (Signed)
Patient evaluated in pre-op.  Repeat Cardiac Echo shows critical AS with evidence of LA strain.  Case was discussed with attending anesthesiologist, who ordered the evaluation.  It was felt that he was at too high risk for death on induction of anesthesia to proceed with this elective case.  This was discussed with the patient and his wife.  They appear to understand that the risks of surgery do not outweigh the risks of his colonic volvulus.  I think his risk of colonic ischemia from the volvulus can be managed with endoscopic decompression until he can be further evaluated by a cardiac surgeon.

## 2014-01-20 ENCOUNTER — Ambulatory Visit: Payer: Medicare Other | Admitting: Interventional Cardiology

## 2014-01-30 ENCOUNTER — Telehealth: Payer: Self-pay | Admitting: Interventional Cardiology

## 2014-01-30 DIAGNOSIS — I350 Nonrheumatic aortic (valve) stenosis: Secondary | ICD-10-CM

## 2014-01-30 NOTE — Telephone Encounter (Signed)
Please refer to cardiac surgery- Dr. Cyndia Bent, to get their input regarding aortic stenosis.

## 2014-01-31 NOTE — Addendum Note (Signed)
Addended byUlla Potash H on: 01/31/2014 11:12 AM   Modules accepted: Orders

## 2014-01-31 NOTE — Telephone Encounter (Signed)
Referral sent, lmtrc

## 2014-02-03 NOTE — Telephone Encounter (Signed)
lmtrc

## 2014-02-04 NOTE — Telephone Encounter (Signed)
Pt notified of appt with Dr. Cyndia Bent on 02/12/14.

## 2014-02-12 ENCOUNTER — Encounter: Payer: Medicare HMO | Admitting: Surgery

## 2014-02-14 ENCOUNTER — Encounter: Payer: Self-pay | Admitting: Surgery

## 2014-02-14 ENCOUNTER — Institutional Professional Consult (permissible substitution) (INDEPENDENT_AMBULATORY_CARE_PROVIDER_SITE_OTHER): Payer: Medicare HMO | Admitting: Surgery

## 2014-02-14 VITALS — BP 125/59 | HR 72 | Resp 20 | Ht 73.0 in | Wt 157.0 lb

## 2014-02-14 DIAGNOSIS — I359 Nonrheumatic aortic valve disorder, unspecified: Secondary | ICD-10-CM

## 2014-02-14 DIAGNOSIS — I350 Nonrheumatic aortic (valve) stenosis: Secondary | ICD-10-CM

## 2014-02-14 NOTE — Progress Notes (Signed)
Cardiothoracic Surgery Consultation   PCP is Jani Gravel, MD Referring Provider is Jettie Booze, MD  Chief Complaint  Patient presents with  . Aortic Stenosis    Surgical eval, 2D Echo 12/26/13,    HPI:  This patient is a very unfortunate gentleman with a history of moderate to severe aortic stenosis in the past by his prior echo in 2013 who has a history of sigmoid volvulus as well as bowel and bladder incontinence. He was scheduled for colon resection but his preop evaluation including an echo showed severe AS with a mean gradient of 38 and a peak of 59 with an AVA of 0.5 cm2. His LVEF was 55-60%. Anesthesia was concerned about putting him under and surgery was postponed. They patient has some dementia which his wife says has been getting worse over the past 6 months. He remains very active and denies and shortness of breath or chest discomfort. He spent all day yesterday digging out stumps from his yard. He does report some dizziness with bending over if it is really hot outside.  Past Medical History  Diagnosis Date  . Coronary artery disease   . Dementia   . Psoriasis   . Sigmoid volvulus   . Incontinence of bowel   . Incontinence of urine   . Anemia   . Hypertension     CONTROLLED SINCE WEIGHT LOSS    Past Surgical History  Procedure Laterality Date  . Colonoscopy  2011    Blockage  . Flexible sigmoidoscopy  09/20/2011    Procedure: FLEXIBLE SIGMOIDOSCOPY;  Surgeon: Jeryl Columbia, MD;  Location: [facility]COPY;  Service: Endoscopy;  Laterality: N/A;  . Hernia repair      Family History  Problem Relation Age of Onset  . Cancer Mother   . Hypertension Mother   . Cancer Father   . Cancer Sister   . Heart attack Son   . Heart disease Son     before age 26    Social History History  Substance Use Topics  . Smoking status: Former Smoker    Quit date: 09/20/1971  . Smokeless tobacco: Never Used  . Alcohol Use: No    Current Outpatient Prescriptions    Medication Sig Dispense Refill  . aspirin 81 MG tablet Take 81 mg by mouth daily.      . Coenzyme Q10 (COQ10) 200 MG CAPS Take 1 capsule by mouth daily.      . Memantine HCl ER (NAMENDA XR) 28 MG CP24 Take 1 capsule by mouth daily.      . potassium chloride SA (K-DUR,KLOR-CON) 20 MEQ tablet Take 2 tablets (40 mEq total) by mouth daily.  14 tablet  0  . tamsulosin (FLOMAX) 0.4 MG CAPS capsule Take 0.4 mg by mouth daily.       . Phosphatidylserine-DHA-EPA (VAYACOG) 100-19.5-6.5 MG CAPS Take 1 capsule by mouth daily.       No current facility-administered medications for this visit.    Allergies  Allergen Reactions  . Ciprofloxacin Hives    Review of Systems  Constitutional: Negative for fever, diaphoresis, activity change, fatigue and unexpected weight change.  HENT: Negative.        Has not seen a dentist in a few years due to inability to afford it.  Eyes: Negative.   Respiratory: Negative.  Negative for shortness of breath.   Cardiovascular: Negative for chest pain, palpitations and leg swelling.  Gastrointestinal: Positive for diarrhea.  Endocrine: Negative.   Genitourinary:  Incontinence  Allergic/Immunologic: Negative.   Neurological: Negative for dizziness and syncope.  Hematological: Negative.   Psychiatric/Behavioral:       Dementia with memory loss and confusion.    BP 125/59  Pulse 72  Resp 20  Ht 6\' 1"  (1.854 m)  Wt 157 lb (71.215 kg)  BMI 20.72 kg/m2  SpO2 97% Physical Exam   Diagnostic Tests:  *[facility]* *[facility] [employer]. [place], [place] [address] [phone]  ------------------------------------------------------------ Transthoracic Echocardiography  Patient: [name] Galloway, [name] Galloway MR #: [id number] Study Date: 12/26/2013 Gender: M Age: 79 Height: 185.4cm Weight: 63.6kg BSA: 1.45m^2 Pt. Status: Room:  REFERRING Jani Gravel SONOGRAPHER Tresa Res, RDCS ATTENDING Pamella Pert,  Lance Coon, John PERFORMING [facility], Outpatient cc:  ------------------------------------------------------------ LV EF: 55% - 60%  ------------------------------------------------------------ Indications: V728.1 Pre-op evaluation. Aortic stenosis 424.1.  ------------------------------------------------------------ History: PMH: Aortic valve disease. Risk factors: Dementia. Former tobacco use. Hypertension.  ------------------------------------------------------------ Study Conclusions  - Left ventricle: The cavity size was normal. There was moderate concentric hypertrophy. Systolic function was normal. The estimated ejection fraction was in the range of 55% to 60%. Incoordinate distal septal motion. Doppler parameters are consistent with abnormal left ventricular relaxation (grade 1 diastolic dysfunction). The E/e' ratio is >15, suggesting elevated LV filling pressure. - Aortic valve: Heavily calcified leaflets. Peak and mean gradients of 59 and 38 mmHg, respectively. Based on an LVOT diameter of 1.7 cm, the calculated AVA is 0.5 cm2, consistent with severe aortic stenosis. Trivial regurgitation. Valve area: 0.47cm^2(VTI). Valve area: 0.5cm^2 (Vmax). - Mitral valve: Heavy MAC. Mild regurgitation. - Left atrium: Severely dilated (>45 ml/m2). - Right atrium: The atrium was mildly dilated. Transthoracic echocardiography. M-mode, complete 2D, spectral Doppler, and color Doppler. Height: Height: 185.4cm. Height: 73in. Weight: Weight: 63.6kg. Weight: 140lb. Body mass index: BMI: 18.5kg/m^2. Body surface area: BSA: 1.89m^2. Blood pressure: 151/61. Patient status: Outpatient. Location: Echo laboratory.  ------------------------------------------------------------  ------------------------------------------------------------ Left ventricle: The cavity size was normal. There was moderate concentric hypertrophy. Systolic function was normal. The estimated ejection  fraction was in the range of 55% to 60%. Incoordinate distal septal motion. Doppler parameters are consistent with abnormal left ventricular relaxation (grade 1 diastolic dysfunction). The E/e' ratio is >15, suggesting elevated LV filling pressure.  ------------------------------------------------------------ Aortic valve: Heavily calcified leaflets. Peak and mean gradients of 59 and 38 mmHg, respectively. Based on an LVOT diameter of 1.7 cm, the calculated AVA is 0.5 cm2, consistent with severe aortic stenosis. Doppler: Trivial regurgitation. Valve area: 0.47cm^2(VTI). Indexed valve area: 0.26cm^2/m^2 (VTI). Peak velocity ratio of LVOT to aortic valve: 0.22. Valve area: 0.5cm^2 (Vmax). Indexed valve area: 0.28cm^2/m^2 (Vmax). Mean gradient: 40mm Hg (S). Peak gradient: 41mm Hg (S).  ------------------------------------------------------------ Aorta: Aortic root: The aortic root was normal in size. Ascending aorta: The ascending aorta was normal in size.  ------------------------------------------------------------ Mitral valve: Heavy MAC. Doppler: Mild regurgitation. Peak gradient: 79mm Hg (D).  ------------------------------------------------------------ Left atrium: Severely dilated (>45 ml/m2).  ------------------------------------------------------------ Atrial septum: Poorly visualized.  ------------------------------------------------------------ Right ventricle: The cavity size was normal. Wall thickness was normal. Systolic function was normal.  ------------------------------------------------------------ Pulmonic valve: The valve appears to be grossly normal. Doppler: No significant regurgitation.  ------------------------------------------------------------ Tricuspid valve: Poorly visualized. Doppler: Trivial regurgitation.  ------------------------------------------------------------ Pulmonary artery: The main pulmonary artery  was normal-sized.  ------------------------------------------------------------ Right atrium: The atrium was mildly dilated.  ------------------------------------------------------------ Pericardium: There was no pericardial effusion.  ------------------------------------------------------------ Systemic veins: The IVC is not well visualized.  ------------------------------------------------------------  2D measurements Normal Doppler measurements Normal Left ventricle  Left ventricle LVID ED, 43.2 mm 43-52 Ea, lat 4.79 cm/s ------ chord, ann, tiss PLAX DP LVID ES, 33.4 mm 23-38 E/Ea, lat 16.2 ------ chord, ann, tiss PLAX DP FS, chord, 23 % >29 Ea, med 7.72 cm/s ------ PLAX ann, tiss LVPW, ED 11.2 mm ------ DP IVS/LVPW 1.33 <1.3 E/Ea, med 10.0 ------ ratio, ED ann, tiss 5 Vol ED, 126 ml ------ DP MOD1 LVOT Vol ES, 54.7 ml ------ Peak vel, 78.3 cm/s ------ MOD1 S EF, MOD1 56.6 % ------ Aortic valve Vol index, 70 ml/m^2 ------ Peak vel, 359 cm/s ------ ED, MOD1 S Vol index, 30 ml/m^2 ------ Mean vel, 290 cm/s ------ ES, MOD1 S Vol ED, 113 ml ------ VTI, S 105 cm ------ MOD2 Mean 38 mm Hg ------ Vol ES, 45.9 ml ------ gradient, MOD2 S EF, MOD2 59.4 % ------ Peak 52 mm Hg ------ Stroke 67.1 ml ------ gradient, vol, MOD2 S Vol index, 63 ml/m^2 ------ Area, VTI 0.47 cm^2 ------ ED, MOD2 Area index 0.26 cm^2/m ------ Vol index, 26 ml/m^2 ------ (VTI) ^2 ES, MOD2 Peak vel 0.22 ------ Stroke 37.4 ml/m^2 ------ ratio, index, LVOT/AV MOD2 Area, Vmax 0.5 cm^2 ------ Ventricular septum Area index 0.28 cm^2/m ------ IVS, ED 14.9 mm ------ (Vmax) ^2 LVOT Mitral valve Diam, S 17 mm ------ Peak E vel 77.6 cm/s ------ Area 2.27 cm^2 ------ Peak A vel 106 cm/s ------ Aorta Decelerati 387 ms 150-23 Root diam, 35 mm ------ on time 0 ED Peak 2 mm Hg ------ Left atrium gradient, AP dim 49 mm ------ D AP dim 2.73 cm/m^2 <2.2 Peak E/A 0.7 ------ index  ratio  ------------------------------------------------------------ Prepared and Electronically Authenticated by  Lyman Bishop 2015-05-07T09:[id number]   Impression:  He has severe aortic stenosis that he says is asymptomatic although he does have some dizziness with bending over and has some dementia so he may be in some denial. He is in good physical condition and still very active. I think he would be at increased risk to undergo a major abdominal surgery with severe AS and this should be repaired prior to undergoing an elective abdominal surgery. He will require cardiac cath prior to AVR. This is a difficult situation because of his dementia and need for colon surgery to try to help his disabling fecal and urinary incontinence. He is very alert and conversant and seems to understand and remember what we have discussed so I think he will do well with surgery.   Plan:  We will ask Dr. Irish Lack to schedule cardiac cath and then we will schedule AVR after that.

## 2014-02-17 ENCOUNTER — Encounter: Payer: Self-pay | Admitting: Interventional Cardiology

## 2014-02-20 ENCOUNTER — Encounter: Payer: Self-pay | Admitting: Cardiology

## 2014-02-20 ENCOUNTER — Ambulatory Visit (INDEPENDENT_AMBULATORY_CARE_PROVIDER_SITE_OTHER): Payer: Medicare HMO | Admitting: Interventional Cardiology

## 2014-02-20 ENCOUNTER — Encounter: Payer: Self-pay | Admitting: Interventional Cardiology

## 2014-02-20 VITALS — BP 118/56 | HR 66 | Ht 72.0 in | Wt 162.0 lb

## 2014-02-20 DIAGNOSIS — I350 Nonrheumatic aortic (valve) stenosis: Secondary | ICD-10-CM

## 2014-02-20 DIAGNOSIS — I359 Nonrheumatic aortic valve disorder, unspecified: Secondary | ICD-10-CM

## 2014-02-20 LAB — BASIC METABOLIC PANEL
BUN: 23 mg/dL (ref 6–23)
CO2: 29 mEq/L (ref 19–32)
CREATININE: 1.4 mg/dL (ref 0.4–1.5)
Calcium: 8.5 mg/dL (ref 8.4–10.5)
Chloride: 100 mEq/L (ref 96–112)
GFR: 54 mL/min — ABNORMAL LOW (ref >60.00)
Glucose, Bld: 89 mg/dL (ref 70–99)
Potassium: 3.3 mEq/L — ABNORMAL LOW (ref 3.5–5.1)
Sodium: 136 mEq/L (ref 135–145)

## 2014-02-20 LAB — CBC WITH DIFFERENTIAL/PLATELET
Basophils Absolute: 0 10*3/uL (ref 0.0–0.1)
Basophils Relative: 0.5 % (ref 0.0–3.0)
EOS ABS: 0.4 10*3/uL (ref 0.0–0.7)
Eosinophils Relative: 5.6 % — ABNORMAL HIGH (ref 0.0–5.0)
HCT: 33.2 % — ABNORMAL LOW (ref 39.0–52.0)
Hemoglobin: 11.3 g/dL — ABNORMAL LOW (ref 13.0–17.0)
Lymphocytes Relative: 19.5 % (ref 12.0–46.0)
Lymphs Abs: 1.2 10*3/uL (ref 0.7–4.0)
MCHC: 33.9 g/dL (ref 30.0–36.0)
MCV: 87.9 fl (ref 78.0–100.0)
Monocytes Absolute: 0.5 10*3/uL (ref 0.1–1.0)
Monocytes Relative: 7.3 % (ref 3.0–12.0)
NEUTROS ABS: 4.2 10*3/uL (ref 1.4–7.7)
Neutrophils Relative %: 67.1 % (ref 43.0–77.0)
Platelets: 154 10*3/uL (ref 150.0–400.0)
RBC: 3.77 Mil/uL — AB (ref 4.22–5.81)
RDW: 13.6 % (ref 11.5–15.5)
WBC: 6.3 10*3/uL (ref 4.0–10.5)

## 2014-02-20 LAB — PROTIME-INR
INR: 1 ratio (ref 0.8–1.0)
Prothrombin Time: 11.2 s (ref 9.6–13.1)

## 2014-02-20 NOTE — Progress Notes (Signed)
Patient ID: Jevante Hollibaugh, male   DOB: [birth year], 79 y.o.   MRN: [id number] Patient ID: Alontae Chaloux, male   DOB: 1935-01-28, 79 y.o.   MRN: [id number]    [place], [address] [place], [place]  [address] Phone: [phone] Fax:  [phone]  Date:  02/20/2014   ID:  Bobbye Reinitz, DOB Apr 13, 1935, MRN [id number]  PCP:  Jani Gravel, MD      History of Present Illness: Ashante Snelling is a 79 y.o. male who has severe AS.  He had a colonic obstruction in 2/13. He had a procedure to correct this. It was not an open repair. He has some urinary incontinence with position changes.  He no longer works at [employer]. Prior chest xray revealed coronary calcification and aortic atherosclerosis.  He has not had any chest discomfort or shortness of breath. He requires colon Surgery to correct the obstruction he had back in 2013.  Surgery was postponed due to elevated risk from severe aortic stenosis. He had not been having symptoms of GERD stenosis. This changed recently. Now, he notes dizziness with exertion when he is walking outside. He has not passed out yet. He saw Dr. Cyndia Bent. Surgery was recommended. He is here to schedule cath.    Wt Readings from Last 3 Encounters:  02/20/14 162 lb (73.483 kg)  02/14/14 157 lb (71.215 kg)  12/18/13 157 lb (71.215 kg)     Past Medical History  Diagnosis Date  . Coronary artery disease   . Dementia   . Psoriasis   . Sigmoid volvulus   . Incontinence of bowel   . Incontinence of urine   . Anemia   . Hypertension     CONTROLLED SINCE WEIGHT LOSS    Current Outpatient Prescriptions  Medication Sig Dispense Refill  . aspirin 81 MG tablet Take 81 mg by mouth daily.      . Coenzyme Q10 (COQ10) 200 MG CAPS Take 1 capsule by mouth daily.      . Memantine HCl ER (NAMENDA XR) 28 MG CP24 Take 1 capsule by mouth daily.      . Phosphatidylserine-DHA-EPA (VAYACOG) 100-19.5-6.5 MG CAPS Take 1 capsule by mouth daily.      . potassium chloride SA  (K-DUR,KLOR-CON) 20 MEQ tablet Take 2 tablets (40 mEq total) by mouth daily.  14 tablet  0  . tamsulosin (FLOMAX) 0.4 MG CAPS capsule Take 0.4 mg by mouth daily.        No current facility-administered medications for this visit.    Allergies:    Allergies  Allergen Reactions  . Ciprofloxacin Hives    Social History:  The patient  reports that he quit smoking about 42 years ago. He has never used smokeless tobacco. He reports that he does not drink alcohol or use illicit drugs.   Family History:  The patient's family history includes Cancer in his father, mother, and sister; Heart attack in his son; Heart disease in his son; Hypertension in his mother.   ROS:  Please see the history of present illness.  No nausea, vomiting.  No fevers, chills.  No focal weakness.  No dysuria. Abdominal pain.    All other systems reviewed and negative.   PHYSICAL EXAM: VS:  BP 118/56  Pulse 66  Ht 6' (1.829 m)  Wt 162 lb (73.483 kg)  BMI 21.97 kg/m2 Well nourished, well developed, in no acute distress HEENT: normal Neck: no JVD, no carotid bruits; normal carotid upstroke Cardiac:  normal S1, S2;  RRR; III/VI systolic murmur Lungs:  clear to auscultation bilaterally, no wheezing, rhonchi or rales Abd: soft, nontender, no hepatomegaly Ext: no edema Skin: warm and dry Neuro:   no focal abnormalities noted  EKG:  NSR, LBBB     ASSESSMENT AND PLAN:  Aortic valve disorders  IMAGING: EKG   Harward,Amy 01/15/2013 11:01:59 AM > Stormy Sabol,JAY 01/15/2013 11:19:49 AM > NSR, LBBB   Notes: Symptoms now of severe AS Including lightheadedness with exertion.  2. Hypertension, essential  Notes: Controlled after significant weight loss.  3. Hyperlipemia, mixed  Notes: Lipids controlled. LDL 125 in 6/14 off of statin. Wants to stay off of statin due to memory issues, so would recommend healthier diet. Avoid fatty foods.  Wife feels dementia is getting worse.  He has stop working. He no longer drives by  himself. 4. risks and benefits cardiac cath were explained to the patient and wife and he is willing to proceed. Cardiac cath will be scheduled.  LBBB: Chronic, noted for many years by ECG.   Signed, Mina Marble, MD, [facility] 02/20/2014 3:22 PM

## 2014-02-20 NOTE — Patient Instructions (Signed)
Your physician recommends that you continue on your current medications as directed. Please refer to the Current Medication list given to you today. Your physician has requested that you have a cardiac catheterization. Cardiac catheterization is used to diagnose and/or treat various heart conditions. Doctors may recommend this procedure for a number of different reasons. The most common reason is to evaluate chest pain. Chest pain can be a symptom of coronary artery disease (CAD), and cardiac catheterization can show whether plaque is narrowing or blocking your heart's arteries. This procedure is also used to evaluate the valves, as well as measure the blood flow and oxygen levels in different parts of your heart. For further information please visit www.cardiosmart.org. Please follow instruction sheet, as given.   

## 2014-02-24 ENCOUNTER — Encounter (HOSPITAL_COMMUNITY): Payer: Self-pay | Admitting: Pharmacy Technician

## 2014-02-25 ENCOUNTER — Other Ambulatory Visit: Payer: Self-pay | Admitting: Interventional Cardiology

## 2014-02-25 DIAGNOSIS — I359 Nonrheumatic aortic valve disorder, unspecified: Secondary | ICD-10-CM

## 2014-02-26 ENCOUNTER — Ambulatory Visit (HOSPITAL_COMMUNITY)
Admission: RE | Admit: 2014-02-26 | Discharge: 2014-02-26 | Disposition: A | Discharge status: Home / Self Care | Payer: Medicare HMO | Source: Ambulatory Visit | Attending: Interventional Cardiology | Admitting: Interventional Cardiology

## 2014-02-26 ENCOUNTER — Encounter (HOSPITAL_COMMUNITY)
Admission: RE | Disposition: A | Discharge status: Home / Self Care | Payer: Self-pay | Source: Ambulatory Visit | Attending: Interventional Cardiology

## 2014-02-26 DIAGNOSIS — I359 Nonrheumatic aortic valve disorder, unspecified: Secondary | ICD-10-CM | POA: Insufficient documentation

## 2014-02-26 DIAGNOSIS — I2510 Atherosclerotic heart disease of native coronary artery without angina pectoris: Secondary | ICD-10-CM | POA: Insufficient documentation

## 2014-02-26 DIAGNOSIS — L408 Other psoriasis: Secondary | ICD-10-CM | POA: Insufficient documentation

## 2014-02-26 DIAGNOSIS — R32 Unspecified urinary incontinence: Secondary | ICD-10-CM | POA: Insufficient documentation

## 2014-02-26 DIAGNOSIS — Z7982 Long term (current) use of aspirin: Secondary | ICD-10-CM | POA: Insufficient documentation

## 2014-02-26 DIAGNOSIS — F0390 Unspecified dementia without behavioral disturbance: Secondary | ICD-10-CM | POA: Insufficient documentation

## 2014-02-26 HISTORY — PX: LEFT AND RIGHT HEART CATHETERIZATION WITH CORONARY ANGIOGRAM: SHX5449

## 2014-02-26 LAB — POCT I-STAT 3, ART BLOOD GAS (G3+)
Bicarbonate: 24.6 mEq/L — ABNORMAL HIGH (ref 20.0–24.0)
O2 Saturation: 96 %
PO2 ART: 80 mmHg (ref 80.0–100.0)
TCO2: 26 mmol/L (ref 0–100)
pCO2 arterial: 38.7 mmHg (ref 35.0–45.0)
pH, Arterial: 7.41 (ref 7.350–7.450)

## 2014-02-26 LAB — POCT I-STAT 3, VENOUS BLOOD GAS (G3P V)
BICARBONATE: 24.9 meq/L — AB (ref 20.0–24.0)
O2 Saturation: 68 %
TCO2: 26 mmol/L (ref 0–100)
pCO2, Ven: 42.8 mmHg — ABNORMAL LOW (ref 45.0–50.0)
pH, Ven: 7.373 — ABNORMAL HIGH (ref 7.250–7.300)
pO2, Ven: 37 mmHg (ref 30.0–45.0)

## 2014-02-26 LAB — POCT ACTIVATED CLOTTING TIME: Activated Clotting Time: 225 seconds

## 2014-02-26 LAB — POTASSIUM: POTASSIUM: 3.9 meq/L (ref 3.7–5.3)

## 2014-02-26 SURGERY — LEFT AND RIGHT HEART CATHETERIZATION WITH CORONARY ANGIOGRAM
Anesthesia: LOCAL

## 2014-02-26 MED ORDER — VERAPAMIL HCL 2.5 MG/ML IV SOLN
INTRAVENOUS | Status: AC
  Filled 2014-02-26: qty 2

## 2014-02-26 MED ORDER — SODIUM CHLORIDE 0.9 % IV SOLN: 250.0000 mL | INTRAVENOUS | Status: DC | PRN

## 2014-02-26 MED ORDER — ASPIRIN 81 MG PO CHEW
CHEWABLE_TABLET | ORAL | Status: AC
  Filled 2014-02-26: qty 1

## 2014-02-26 MED ORDER — HEPARIN (PORCINE) IN NACL 2-0.9 UNIT/ML-% IJ SOLN
INTRAMUSCULAR | Status: AC
  Filled 2014-02-26: qty 1500

## 2014-02-26 MED ORDER — NITROGLYCERIN 0.2 MG/ML ON CALL CATH LAB
INTRAVENOUS | Status: AC
  Filled 2014-02-26: qty 1

## 2014-02-26 MED ORDER — HEPARIN SODIUM (PORCINE) 1000 UNIT/ML IJ SOLN
INTRAMUSCULAR | Status: AC
  Filled 2014-02-26: qty 1

## 2014-02-26 MED ORDER — FENTANYL CITRATE 0.05 MG/ML IJ SOLN
INTRAMUSCULAR | Status: AC
  Filled 2014-02-26: qty 2

## 2014-02-26 MED ORDER — SODIUM CHLORIDE 0.9 % IV SOLN
INTRAVENOUS | Status: DC
  Administered 2014-02-26: 09:00:00 via INTRAVENOUS

## 2014-02-26 MED ORDER — ASPIRIN 81 MG PO CHEW
81.0000 mg | CHEWABLE_TABLET | ORAL | Status: AC
  Administered 2014-02-26: 81 mg via ORAL

## 2014-02-26 MED ORDER — MIDAZOLAM HCL 2 MG/2ML IJ SOLN
INTRAMUSCULAR | Status: AC
  Filled 2014-02-26: qty 2

## 2014-02-26 MED ORDER — SODIUM CHLORIDE 0.9 % IV SOLN: 1.0000 mL/kg/h | INTRAVENOUS | Status: DC

## 2014-02-26 MED ORDER — LIDOCAINE HCL (PF) 1 % IJ SOLN
INTRAMUSCULAR | Status: AC
  Filled 2014-02-26: qty 30

## 2014-02-26 MED ORDER — SODIUM CHLORIDE 0.9 % IJ SOLN
3.0000 mL | Freq: Two times a day (BID) | INTRAMUSCULAR | Status: DC
  Administered 2014-02-26: 3 mL via INTRAVENOUS

## 2014-02-26 MED ORDER — SODIUM CHLORIDE 0.9 % IJ SOLN: 3.0000 mL | INTRAMUSCULAR | Status: DC | PRN

## 2014-02-26 NOTE — H&P (View-Only) (Signed)
Patient ID: [name] Galloway, male   DOB: 1934/10/21, 79 y.o.   MRN: [id number] Patient ID: [name] Galloway, male   DOB: 17-Jan-1935, 79 y.o.   MRN: [id number]    [place], [place], [place]  [address] Phone: [phone] Fax:  [phone]  Date:  02/20/2014   ID:  [name] Galloway, DOB July 06, 1935, MRN [id number]  PCP:  [name] Gravel, MD      History of Present Illness: [name] Galloway is a 79 y.o. male who has severe AS.  He had a colonic obstruction in 2/13. He had a procedure to correct this. It was not an open repair. He has some urinary incontinence with position changes.  He no longer works at [employer]. Prior chest xray revealed coronary calcification and aortic atherosclerosis.  He has not had any chest discomfort or shortness of breath. He requires colon Surgery to correct the obstruction he had back in 2013.  Surgery was postponed due to elevated risk from severe aortic stenosis. He had not been having symptoms of GERD stenosis. This changed recently. Now, he notes dizziness with exertion when he is walking outside. He has not passed out yet. He saw Dr. Cyndia Bent. Surgery was recommended. He is here to schedule cath.    Wt Readings from Last 3 Encounters:  02/20/14 162 lb (73.483 kg)  02/14/14 157 lb (71.215 kg)  12/18/13 157 lb (71.215 kg)     Past Medical History  Diagnosis Date  . Coronary artery disease   . Dementia   . Psoriasis   . Sigmoid volvulus   . Incontinence of bowel   . Incontinence of urine   . Anemia   . Hypertension     CONTROLLED SINCE WEIGHT LOSS    Current Outpatient Prescriptions  Medication Sig Dispense Refill  . aspirin 81 MG tablet Take 81 mg by mouth daily.      . Coenzyme Q10 (COQ10) 200 MG CAPS Take 1 capsule by mouth daily.      . Memantine HCl ER (NAMENDA XR) 28 MG CP24 Take 1 capsule by mouth daily.      . Phosphatidylserine-DHA-EPA (VAYACOG) 100-19.5-6.5 MG CAPS Take 1 capsule by mouth daily.      . potassium chloride SA  (K-DUR,KLOR-CON) 20 MEQ tablet Take 2 tablets (40 mEq total) by mouth daily.  14 tablet  0  . tamsulosin (FLOMAX) 0.4 MG CAPS capsule Take 0.4 mg by mouth daily.        No current facility-administered medications for this visit.    Allergies:    Allergies  Allergen Reactions  . Ciprofloxacin Hives    Social History:  The patient  reports that he quit smoking about 42 years ago. He has never used smokeless tobacco. He reports that he does not drink alcohol or use illicit drugs.   Family History:  The patient's family history includes Cancer in his father, mother, and sister; Heart attack in his son; Heart disease in his son; Hypertension in his mother.   ROS:  Please see the history of present illness.  No nausea, vomiting.  No fevers, chills.  No focal weakness.  No dysuria. Abdominal pain.    All other systems reviewed and negative.   PHYSICAL EXAM: VS:  BP 118/56  Pulse 66  Ht 6' (1.829 m)  Wt 162 lb (73.483 kg)  BMI 21.97 kg/m2 Well nourished, well developed, in no acute distress HEENT: normal Neck: no JVD, no carotid bruits; normal carotid upstroke Cardiac:  normal S1, S2;  RRR; III/VI systolic murmur Lungs:  clear to auscultation bilaterally, no wheezing, rhonchi or rales Abd: soft, nontender, no hepatomegaly Ext: no edema Skin: warm and dry Neuro:   no focal abnormalities noted  EKG:  NSR, LBBB     ASSESSMENT AND PLAN:  Aortic valve disorders  IMAGING: EKG   [name] Galloway,[name] Galloway 01/15/2013 11:01:59 AM > [name] Galloway,[name] Galloway 01/15/2013 11:19:49 AM > NSR, LBBB   Notes: Symptoms now of severe AS Including lightheadedness with exertion.  2. Hypertension, essential  Notes: Controlled after significant weight loss.  3. Hyperlipemia, mixed  Notes: Lipids controlled. LDL 125 in 6/14 off of statin. Wants to stay off of statin due to memory issues, so would recommend healthier diet. Avoid fatty foods.  Wife feels dementia is getting worse.  He has stop working. He no longer drives by  himself. 4. risks and benefits cardiac cath were explained to the patient and wife and he is willing to proceed. Cardiac cath will be scheduled.  LBBB: Chronic, noted for many years by ECG.   Signed, Mina Marble, MD, [facility] 02/20/2014 3:22 PM

## 2014-02-26 NOTE — Interval H&P Note (Signed)
Cath Lab Visit (complete for each Cath Lab visit)  Clinical Evaluation Leading to the Procedure:   ACS: No.  Non-ACS:    Anginal Classification: CCS II  Anti-ischemic medical therapy: Minimal Therapy (1 class of medications)  Non-Invasive Test Results: No non-invasive testing performed  Prior CABG: No previous CABG   Preoperative evaluation for AVR.   History and Physical Interval Note:  02/26/2014 10:11 AM  [name] Galloway  has presented today for surgery, with the diagnosis of valve stenosis  The various methods of treatment have been discussed with the patient and family. After consideration of risks, benefits and other options for treatment, the patient has consented to  Procedure(s): LEFT AND RIGHT HEART CATHETERIZATION WITH CORONARY ANGIOGRAM (N/A) as a surgical intervention .  The patient's history has been reviewed, patient examined, no change in status, stable for surgery.  I have reviewed the patient's chart and labs.  Questions were answered to the patient's satisfaction.     Nylee Barbuto S.

## 2014-02-26 NOTE — CV Procedure (Addendum)
PROCEDURE:  Selective coronary angiography, right heart catheterization. Right upper extremity venogram.  INDICATIONS:  Preoperative evaluation prior to aortic valve replacement  The risks, benefits, and details of the procedure were explained to the patient.  The patient verbalized understanding and wanted to proceed.  Informed written consent was obtained.  PROCEDURE TECHNIQUE:  In the holding area, a right antecubital IV was placed. The usual wire to place the brachial sheath would not traverse into the upper extremity. A BMW wire was advanced through the IV catheter into the central circulation. The brachial sheath was placed after lidocaine was given subcutaneous. A 5 [place] Swan catheter was advanced but would not cross the right upper extremity venous anatomy. A hand-injection of contrast was done through the distal port of the Traver catheter. This revealed a diffusely narrowed vein. The catheter did eventually cross into the axillary vein and SVC. However, it would not advance into the right atrium. After several attempts, the catheter was removed.  After Xylocaine anesthesia a 40F slender sheath was placed in the right radial artery with a single anterior needle wall stick.  The right groin was prepped and draped in usual sterile fashion. A 7 French sheath was placed into the right femoral vein in similar fashion to above. The Swan catheter was advanced to the pulmonary artery. Hemodynamics were obtained. The resolution was performed. The Swan catheter was removed.  After access to the ascending aorta, IV heparin was given and Right coronary angiography was attempted using a Judkins R4 guide catheter.  This catheter would not engage the vessel. Left coronary angiography was done using a Judkins L3.5 guide catheter.  A Williams right catheter was used to try and engage the RCA unsuccessfully. Finally, an AL-1 catheter was successful and selectively engaging the right coronary artery. The  aortic valve was not crossed due to known severe aortic stenosis.  A TR band was used for hemostasis.  Manual compression will be used for the right brachial venous sheath and the right femoral vein sheath.    CONTRAST:  Total of 85 cc.  COMPLICATIONS:  None.    HEMODYNAMICS:  Aortic pressure was 121/60; LV pressure was not obtained; LVEDP not obtained. Right atrial pressure six over three, mean right atrial pressure 2 mmHg; RV pressure thirty-one over zero, RVEDP 5 mmHg; pulmonary artery pressure 22/7, mean PA pressure 14 mmHg; pulmonary capillary wedge pressure 14/15, mean pulmonary capillary wedge pressure 12 mmHg.  Pulmonary artery saturation 68%. Aortic saturation 96%. Cardiac output by Fick 6.1 L per minute. Cardiac index 3.1.   Cardiac output by thermodilution 6.4 L per minute.  Cardiac index 3.2.  ANGIOGRAPHIC DATA:   The left main coronary artery is widely patent.  The left anterior descending artery is a large vessel which wraps around the apex. There is mild atherosclerosis in the mid vessel. There is a large diagonal vessel which originates from the mid LAD. This vessel appears widely patent.  The left circumflex artery is a large vessel. The first obtuse marginal is a medium size vessel and has a 50% proximal stenosis. Second obtuse marginal is a large, branching vessel across the lateral wall. There is no significant obstructive disease in this vessel. The remainder of the circumflex is medium-sized and patent. There is a small third obtuse marginal vessel. At the terminal circumflex, there appears to be very small AV fistula.   The right coronary artery is a large, dominant vessel. In the proximal origin of the vessel,  there is a severe, 90% stenosis, with poststenotic dilatation. The posterior descending artery is widely patent. Posterior lateral artery is widely patent. The RCA has an anterior takeoff and is best engaged with the Amplatz guide catheter.  Diffusely narrowed vein in  the right upper extremity which was not large enough to allow for a 5 French catheter to cross into the right heart.  LEFT VENTRICULOGRAM:  Left ventricular angiogram was not done.  IMPRESSIONS:  1. Normal left main coronary artery. 2. Widely patent left anterior descending artery and its branches. 3. Widely patent left circumflex artery and its branches. 4. 90% stenosis in the proximal right coronary artery. 5. Left ventricular systolic function not assessed.  6.  Normal pulmonary artery pressure. Normal cardiac output.  RECOMMENDATION:  He'll followup with the cardiac surgeon regarding aortic valve replacement and single-vessel bypass of the RCA. He'll be watched for several hours here and hydrated. He'll be sent home from short stay later today.

## 2014-02-26 NOTE — Progress Notes (Signed)
Site area: right groin venous sheath and right brachial venous sheath by Doran Clay, RN  Site Prior to Removal:  Level 0 x 2  Pressure Applied For 10 MINUTES  ;both sites  Manual:   Yes.    Patient Status During Pull:  Stable   Post Pull both Sites::  Level 0  Post Pull Instructions Given:  Yes.    Post Pull Pulses Present: n/a Dressing Applied: both sites Bedrest begins at 1230  Comments patient remained stable during sheath

## 2014-02-26 NOTE — Discharge Instructions (Signed)
No lifting more than 10 lbs for one week.   Follow post radial cath instructions. Radial Site Care Refer to this sheet in the next few weeks. These instructions provide you with information on caring for yourself after your procedure. Your caregiver may also give you more specific instructions. Your treatment has been planned according to current medical practices, but problems sometimes occur. Call your caregiver if you have any problems or questions after your procedure. HOME CARE INSTRUCTIONS  You may shower the day after the procedure.Remove the bandage (dressing) and gently wash the site with plain soap and water.Gently pat the site dry.  Do not apply powder or lotion to the site.  Do not submerge the affected site in water for 3 to 5 days.  Inspect the site at least twice daily.  Do not flex or bend the affected arm for 24 hours.  No lifting over 5 pounds (2.3 kg) for 5 days after your procedure.  Do not drive home if you are discharged the same day of the procedure. Have someone else drive you.  You may drive 24 hours after the procedure unless otherwise instructed by your caregiver.  Do not operate machinery or power tools for 24 hours.  A responsible adult should be with you for the first 24 hours after you arrive home. What to expect:  Any bruising will usually fade within 1 to 2 weeks.  Blood that collects in the tissue (hematoma) may be painful to the touch. It should usually decrease in size and tenderness within 1 to 2 weeks. SEEK IMMEDIATE MEDICAL CARE IF:  You have unusual pain at the radial site.  You have redness, warmth, swelling, or pain at the radial site.  You have drainage (other than a small amount of blood on the dressing).  You have chills.  You have a fever or persistent symptoms for more than 72 hours.  You have a fever and your symptoms suddenly get worse.  Your arm becomes pale, cool, tingly, or numb.  You have heavy bleeding from the  site. Hold pressure on the site. Document Released: 09/10/2010 Document Revised: 10/31/2011 Document Reviewed: 09/10/2010 [facility] Patient Information 2015 [place], [place]. This information is not intended to replace advice given to you by your health care provider. Make sure you discuss any questions you have with your health care provider.  Angiogram, Care After Refer to this sheet in the next few weeks. These instructions provide you with information on caring for yourself after your procedure. Your health care provider may also give you more specific instructions. Your treatment has been planned according to current medical practices, but problems sometimes occur. Call your health care provider if you have any problems or questions after your procedure.  WHAT TO EXPECT AFTER THE PROCEDURE After your procedure, it is typical to have the following sensations:  Minor discomfort or tenderness and a small bump at the catheter insertion site. The bump should usually decrease in size and tenderness within 1 to 2 weeks.  Any bruising will usually fade within 2 to 4 weeks. HOME CARE INSTRUCTIONS   You may need to keep taking blood thinners if they were prescribed for you. Only take over-the-counter or prescription medicines for pain, fever, or discomfort as directed by your health care provider.  Do not apply powder or lotion to the site.  Do not sit in a bathtub, swimming pool, or whirlpool for 5 to 7 days.  You may shower 24 hours after the procedure. Remove the  bandage (dressing) and gently wash the site with plain soap and water. Gently pat the site dry.  Inspect the site at least twice daily.  Limit your activity for the first 48 hours. Do not bend, squat, or lift anything over 20 lb (9 kg) or as directed by your health care provider.  Do not drive home if you are discharged the day of the procedure. Have someone else drive you. Follow instructions about when you can drive or return to  work. SEEK MEDICAL CARE IF:  You get lightheaded when standing up.  You have drainage (other than a small amount of blood on the dressing).  You have chills.  You have a fever.  You have redness, warmth, swelling, or pain at the insertion site. SEEK IMMEDIATE MEDICAL CARE IF:   You develop chest pain or shortness of breath, feel faint, or pass out.  You have bleeding, swelling larger than a walnut, or drainage from the catheter insertion site.  You develop pain, discoloration, coldness, or severe bruising in the leg or arm that held the catheter.  You develop bleeding from any other place, such as the bowels. You may see bright red blood in your urine or stools, or your stools may appear black and tarry.  You have heavy bleeding from the site. If this happens, hold pressure on the site. MAKE SURE YOU:  Understand these instructions.  Will watch your condition.  Will get help right away if you are not doing well or get worse. Document Released: 02/24/2005 Document Revised: 08/13/2013 Document Reviewed: 12/31/2012 [facility] Patient Information 2015 [place], [place]. This information is not intended to replace advice given to you by your health care provider. Make sure you discuss any questions you have with your health care provider.

## 2014-02-27 ENCOUNTER — Other Ambulatory Visit: Payer: Self-pay | Admitting: *Deleted

## 2014-02-27 DIAGNOSIS — I2510 Atherosclerotic heart disease of native coronary artery without angina pectoris: Secondary | ICD-10-CM

## 2014-02-27 DIAGNOSIS — I359 Nonrheumatic aortic valve disorder, unspecified: Secondary | ICD-10-CM

## 2014-03-07 ENCOUNTER — Encounter (HOSPITAL_COMMUNITY): Payer: Self-pay | Admitting: Pharmacy Technician

## 2014-03-11 ENCOUNTER — Encounter (HOSPITAL_COMMUNITY)
Admission: RE | Admit: 2014-03-11 | Discharge: 2014-03-11 | Disposition: A | Discharge status: Home / Self Care | Payer: Medicare HMO | Source: Ambulatory Visit | Attending: Surgery | Admitting: Surgery

## 2014-03-11 ENCOUNTER — Encounter (HOSPITAL_COMMUNITY): Payer: Self-pay

## 2014-03-11 ENCOUNTER — Ambulatory Visit (HOSPITAL_COMMUNITY)
Admission: RE | Admit: 2014-03-11 | Discharge: 2014-03-11 | Disposition: A | Discharge status: Home / Self Care | Payer: Medicare HMO | Source: Ambulatory Visit | Attending: Internal Medicine | Admitting: Internal Medicine

## 2014-03-11 ENCOUNTER — Ambulatory Visit (HOSPITAL_COMMUNITY)
Admission: RE | Admit: 2014-03-11 | Discharge: 2014-03-11 | Disposition: A | Discharge status: Home / Self Care | Payer: Medicare HMO | Source: Ambulatory Visit | Attending: Surgery | Admitting: Surgery

## 2014-03-11 VITALS — BP 120/58 | HR 142 | Temp 98.2°F | Resp 18 | Ht 72.0 in | Wt 157.6 lb

## 2014-03-11 DIAGNOSIS — Z01818 Encounter for other preprocedural examination: Secondary | ICD-10-CM | POA: Insufficient documentation

## 2014-03-11 DIAGNOSIS — I6529 Occlusion and stenosis of unspecified carotid artery: Secondary | ICD-10-CM | POA: Diagnosis not present

## 2014-03-11 DIAGNOSIS — Z01810 Encounter for preprocedural cardiovascular examination: Secondary | ICD-10-CM | POA: Insufficient documentation

## 2014-03-11 DIAGNOSIS — I2510 Atherosclerotic heart disease of native coronary artery without angina pectoris: Secondary | ICD-10-CM

## 2014-03-11 DIAGNOSIS — I658 Occlusion and stenosis of other precerebral arteries: Secondary | ICD-10-CM | POA: Diagnosis not present

## 2014-03-11 DIAGNOSIS — I359 Nonrheumatic aortic valve disorder, unspecified: Secondary | ICD-10-CM

## 2014-03-11 DIAGNOSIS — I350 Nonrheumatic aortic (valve) stenosis: Secondary | ICD-10-CM

## 2014-03-11 HISTORY — DX: Personal history of other diseases of the digestive system: Z87.19

## 2014-03-11 LAB — PULMONARY FUNCTION TEST
DL/VA % pred: 77 %
DL/VA: 3.7 ml/min/mmHg/L
DLCO cor % pred: 78 %
DLCO cor: 28.51 ml/min/mmHg
DLCO unc % pred: 78 %
DLCO unc: 28.51 ml/min/mmHg
FEF 25-75 Post: 2.82 L/sec
FEF 25-75 Pre: 2.95 L/sec
FEF2575-%CHANGE-POST: -4 %
FEF2575-%PRED-PRE: 122 %
FEF2575-%Pred-Post: 116 %
FEV1-%CHANGE-POST: -1 %
FEV1-%PRED-POST: 104 %
FEV1-%PRED-PRE: 106 %
FEV1-PRE: 3.6 L
FEV1-Post: 3.53 L
FEV1FVC-%Change-Post: 0 %
FEV1FVC-%PRED-PRE: 106 %
FEV6-%Change-Post: -2 %
FEV6-%Pred-Post: 104 %
FEV6-%Pred-Pre: 107 %
FEV6-Post: 4.59 L
FEV6-Pre: 4.72 L
FEV6FVC-%PRED-PRE: 106 %
FEV6FVC-%Pred-Post: 106 %
FVC-%Change-Post: -2 %
FVC-%Pred-Post: 98 %
FVC-%Pred-Pre: 100 %
FVC-Post: 4.59 L
FVC-Pre: 4.72 L
POST FEV1/FVC RATIO: 77 %
PRE FEV6/FVC RATIO: 100 %
Post FEV6/FVC ratio: 100 %
Pre FEV1/FVC ratio: 76 %
RV % pred: 141 %
RV: 3.9 L
TLC % PRED: 102 %
TLC: 7.82 L

## 2014-03-11 LAB — BLOOD GAS, ARTERIAL
Acid-base deficit: 3.7 mmol/L — ABNORMAL HIGH (ref 0.0–2.0)
Bicarbonate: 20.6 mEq/L (ref 20.0–24.0)
Drawn by: 344381
FIO2: 0.21 %
O2 SAT: 96.8 %
PO2 ART: 86.2 mmHg (ref 80.0–100.0)
Patient temperature: 98.6
TCO2: 21.7 mmol/L (ref 0–100)
pCO2 arterial: 36.1 mmHg (ref 35.0–45.0)
pH, Arterial: 7.375 (ref 7.350–7.450)

## 2014-03-11 LAB — COMPREHENSIVE METABOLIC PANEL
ALT: 16 U/L (ref 0–53)
AST: 26 U/L (ref 0–37)
Albumin: 4.4 g/dL (ref 3.5–5.2)
Alkaline Phosphatase: 63 U/L (ref 39–117)
Anion gap: 19 — ABNORMAL HIGH (ref 5–15)
BUN: 23 mg/dL (ref 6–23)
CALCIUM: 9 mg/dL (ref 8.4–10.5)
CO2: 18 meq/L — AB (ref 19–32)
CREATININE: 1.26 mg/dL (ref 0.50–1.35)
Chloride: 100 mEq/L (ref 96–112)
GFR calc Af Amer: 62 mL/min — ABNORMAL LOW (ref >90)
GFR calc non Af Amer: 53 mL/min — ABNORMAL LOW (ref >90)
Glucose, Bld: 96 mg/dL (ref 70–99)
Potassium: 4.2 mEq/L (ref 3.7–5.3)
Sodium: 137 mEq/L (ref 137–147)
Total Bilirubin: 0.7 mg/dL (ref 0.3–1.2)
Total Protein: 7.6 g/dL (ref 6.0–8.3)

## 2014-03-11 LAB — APTT: APTT: 29 s (ref 24–37)

## 2014-03-11 LAB — PROTIME-INR
INR: 1.07 (ref 0.00–1.49)
Prothrombin Time: 13.9 seconds (ref 11.6–15.2)

## 2014-03-11 LAB — CBC
HCT: 36.2 % — ABNORMAL LOW (ref 39.0–52.0)
Hemoglobin: 12.4 g/dL — ABNORMAL LOW (ref 13.0–17.0)
MCH: 29.4 pg (ref 26.0–34.0)
MCHC: 34.3 g/dL (ref 30.0–36.0)
MCV: 85.8 fL (ref 78.0–100.0)
PLATELETS: 172 10*3/uL (ref 150–400)
RBC: 4.22 MIL/uL (ref 4.22–5.81)
RDW: 13.4 % (ref 11.5–15.5)
WBC: 5.8 10*3/uL (ref 4.0–10.5)

## 2014-03-11 LAB — ABO/RH: ABO/RH(D): A NEG

## 2014-03-11 LAB — SURGICAL PCR SCREEN
MRSA, PCR: NEGATIVE
Staphylococcus aureus: POSITIVE — AB

## 2014-03-11 MED ORDER — ALBUTEROL SULFATE (2.5 MG/3ML) 0.083% IN NEBU
2.5000 mg | INHALATION_SOLUTION | Freq: Once | RESPIRATORY_TRACT | Status: AC
  Administered 2014-03-11: 2.5 mg via RESPIRATORY_TRACT

## 2014-03-11 NOTE — Pre-Procedure Instructions (Signed)
[name] Galloway  03/11/2014   Your procedure is scheduled on: Thursday, March 13, 2014  Report to [facility] Admitting at 7:00 AM.  Call this number if you have problems the morning of surgery: [phone]   Remember: Bring Cardiac Booklet and Byron with you on day of surgery   Do not eat food or drink liquids after midnight Wednesday, March 12, 2014   Take these medicines the morning of surgery with A SIP OF WATER: aspirin, Memantine HCl ER [facility]) Stop taking vitamins, and herbal medications (Phosphatidylserine-DHA-EPA (VAYACOG)  Do not take any NSAIDs ie: Ibuprofen, Advil, Naproxen and etc.; stop now.   Do not wear jewelry, make-up or nail polish.  Do not wear lotions, powders, or perfumes. You may NOT wear deodorant.  Do not shave 48 hours prior to surgery. Men may shave face and neck.  Do not bring valuables to the hospital.  [facility] is not responsible  for any belongings or valuables.               Contacts, dentures or bridgework may not be worn into surgery.  Leave suitcase in the car. After surgery it may be brought to your room.  For patients admitted to the hospital, discharge time is determined by your treatment team.               Patients discharged the day of surgery will not be allowed to drive home.  Name and phone number of your driver:   Special Instructions:  Special Instructions:Special Instructions: [facility] - Preparing for Surgery  Before surgery, you can play an important role.  Because skin is not sterile, your skin needs to be as free of germs as possible.  You can reduce the number of germs on you skin by washing with CHG (chlorahexidine gluconate) soap before surgery.  CHG is an antiseptic cleaner which kills germs and bonds with the skin to continue killing germs even after washing.  Please DO NOT use if you have an allergy to CHG or antibacterial soaps.  If your skin becomes reddened/irritated stop using the CHG and inform  your nurse when you arrive at Short Stay.  Do not shave (including legs and underarms) for at least 48 hours prior to the first CHG shower.  You may shave your face.  Please follow these instructions carefully:   1.  Shower with CHG Soap the night before surgery and the morning of Surgery.  2.  If you choose to wash your hair, wash your hair first as usual with your normal shampoo.  3.  After you shampoo, rinse your hair and body thoroughly to remove the Shampoo.  4.  Use CHG as you would any other liquid soap.  You can apply chg directly  to the skin and wash gently with scrungie or a clean washcloth.  5.  Apply the CHG Soap to your body ONLY FROM THE NECK DOWN.  Do not use on open wounds or open sores.  Avoid contact with your eyes, ears, mouth and genitals (private parts).  Wash genitals (private parts) with your normal soap.  6.  Wash thoroughly, paying special attention to the area where your surgery will be performed.  7.  Thoroughly rinse your body with warm water from the neck down.  8.  DO NOT shower/wash with your normal soap after using and rinsing off the CHG Soap.  9.  Pat yourself dry with a clean towel.  10.  Wear clean pajamas.            11.  Place clean sheets on your bed the night of your first shower and do not sleep with pets.  Day of Surgery  Do not apply any lotions/deodorants the morning of surgery.  Please wear clean clothes to the hospital/surgery center.   Please read over the following fact sheets that you were given: Pain Booklet, Coughing and Deep Breathing, Blood Transfusion Information, Open Heart Packet, MRSA Information and Surgical Site Infection Prevention

## 2014-03-11 NOTE — Progress Notes (Signed)
VASCULAR LAB PRELIMINARY  PRELIMINARY  PRELIMINARY  PRELIMINARY  Pre-op Cardiac Surgery  Carotid Findings:  Bilateral:  1-39% ICA stenosis.  Vertebral artery flow is antegrade.     Upper Extremity Right Left  Brachial Pressures 138 Triphasic 128 Triphasic  Radial Waveforms Triphasic Triphasic  Ulnar Waveforms Triphasic Triphasic  Palmar Arch (Allen's Test) Normal Normal   Findings:  Doppler waveforms remained normal bilaterally with both radial and ulnar compressions    Lower  Extremity Right Left  Dorsalis Pedis    Anterior Tibial    Posterior Tibial    Ankle/Brachial Indices      Findings:  Palpable pedal pulses bilaterally.   Dinna Severs, RVS 03/11/2014, 2:23 PM

## 2014-03-12 LAB — HEMOGLOBIN A1C
HEMOGLOBIN A1C: 5.8 % — AB (ref <5.7)
MEAN PLASMA GLUCOSE: 120 mg/dL — AB (ref <117)

## 2014-03-12 MED ORDER — DOPAMINE-DEXTROSE 3.2-5 MG/ML-% IV SOLN
2.0000 ug/kg/min | INTRAVENOUS | Status: DC
  Filled 2014-03-12: qty 250

## 2014-03-12 MED ORDER — VANCOMYCIN HCL 10 G IV SOLR
1250.0000 mg | INTRAVENOUS | Status: AC
  Administered 2014-03-13: 1250 mg via INTRAVENOUS
  Filled 2014-03-12: qty 1250

## 2014-03-12 MED ORDER — AMINOCAPROIC ACID 250 MG/ML IV SOLN
INTRAVENOUS | Status: AC
  Administered 2014-03-13: 69.8 mL/h via INTRAVENOUS
  Filled 2014-03-12: qty 40

## 2014-03-12 MED ORDER — DEXTROSE 5 % IV SOLN
750.0000 mg | INTRAVENOUS | Status: DC
  Filled 2014-03-12: qty 750

## 2014-03-12 MED ORDER — PHENYLEPHRINE HCL 10 MG/ML IJ SOLN
30.0000 ug/min | INTRAVENOUS | Status: AC
  Administered 2014-03-13: 25 ug/min via INTRAVENOUS
  Filled 2014-03-12: qty 2

## 2014-03-12 MED ORDER — NITROGLYCERIN IN D5W 200-5 MCG/ML-% IV SOLN
2.0000 ug/min | INTRAVENOUS | Status: AC
  Administered 2014-03-13: 5 ug/min via INTRAVENOUS
  Filled 2014-03-12: qty 250

## 2014-03-12 MED ORDER — POTASSIUM CHLORIDE 2 MEQ/ML IV SOLN
80.0000 meq | INTRAVENOUS | Status: DC
  Filled 2014-03-12: qty 40

## 2014-03-12 MED ORDER — SODIUM CHLORIDE 0.9 % IV SOLN
INTRAVENOUS | Status: AC
  Administered 2014-03-13: .9 [IU]/h via INTRAVENOUS
  Filled 2014-03-12: qty 1

## 2014-03-12 MED ORDER — DEXMEDETOMIDINE HCL IN NACL 400 MCG/100ML IV SOLN
0.1000 ug/kg/h | INTRAVENOUS | Status: AC
  Administered 2014-03-13: 0.3 ug/kg/h via INTRAVENOUS
  Filled 2014-03-12: qty 100

## 2014-03-12 MED ORDER — SODIUM CHLORIDE 0.9 % IV SOLN
INTRAVENOUS | Status: DC
  Filled 2014-03-12: qty 30

## 2014-03-12 MED ORDER — PLASMA-LYTE 148 IV SOLN
INTRAVENOUS | Status: AC
  Administered 2014-03-13: 10:00:00
  Filled 2014-03-12: qty 2.5

## 2014-03-12 MED ORDER — MAGNESIUM SULFATE 50 % IJ SOLN
40.0000 meq | INTRAMUSCULAR | Status: DC
  Filled 2014-03-12: qty 10

## 2014-03-12 MED ORDER — DEXTROSE 5 % IV SOLN
0.5000 ug/min | INTRAVENOUS | Status: DC
  Filled 2014-03-12: qty 4

## 2014-03-12 MED ORDER — METOPROLOL TARTRATE 12.5 MG HALF TABLET: 12.5000 mg | ORAL_TABLET | Freq: Once | ORAL | Status: DC

## 2014-03-12 MED ORDER — DEXTROSE 5 % IV SOLN
1.5000 g | INTRAVENOUS | Status: AC
  Administered 2014-03-13: .75 g via INTRAVENOUS
  Administered 2014-03-13: 1.5 g via INTRAVENOUS
  Filled 2014-03-12: qty 1.5

## 2014-03-13 ENCOUNTER — Encounter (HOSPITAL_COMMUNITY): Payer: Medicare HMO | Admitting: Certified Registered"

## 2014-03-13 ENCOUNTER — Encounter (HOSPITAL_COMMUNITY)
Admission: RE | Disposition: A | Discharge status: Home Health | Payer: Medicare HMO | Source: Ambulatory Visit | Attending: Surgery

## 2014-03-13 ENCOUNTER — Inpatient Hospital Stay (HOSPITAL_COMMUNITY): Payer: Medicare HMO

## 2014-03-13 ENCOUNTER — Inpatient Hospital Stay (HOSPITAL_COMMUNITY): Payer: Medicare HMO | Admitting: Certified Registered"

## 2014-03-13 ENCOUNTER — Inpatient Hospital Stay (HOSPITAL_COMMUNITY)
Admission: RE | Admit: 2014-03-13 | Discharge: 2014-03-25 | DRG: 220 | Disposition: A | Discharge status: Home Health | Payer: Medicare HMO | Source: Ambulatory Visit | Attending: Surgery | Admitting: Surgery

## 2014-03-13 ENCOUNTER — Encounter (HOSPITAL_COMMUNITY): Payer: Self-pay | Admitting: Anesthesiology

## 2014-03-13 DIAGNOSIS — I319 Disease of pericardium, unspecified: Secondary | ICD-10-CM | POA: Diagnosis not present

## 2014-03-13 DIAGNOSIS — J9819 Other pulmonary collapse: Secondary | ICD-10-CM | POA: Diagnosis not present

## 2014-03-13 DIAGNOSIS — D6959 Other secondary thrombocytopenia: Secondary | ICD-10-CM | POA: Diagnosis not present

## 2014-03-13 DIAGNOSIS — I10 Essential (primary) hypertension: Secondary | ICD-10-CM | POA: Diagnosis present

## 2014-03-13 DIAGNOSIS — I4892 Unspecified atrial flutter: Secondary | ICD-10-CM | POA: Diagnosis not present

## 2014-03-13 DIAGNOSIS — D62 Acute posthemorrhagic anemia: Secondary | ICD-10-CM | POA: Diagnosis not present

## 2014-03-13 DIAGNOSIS — L408 Other psoriasis: Secondary | ICD-10-CM | POA: Diagnosis present

## 2014-03-13 DIAGNOSIS — Z881 Allergy status to other antibiotic agents status: Secondary | ICD-10-CM | POA: Diagnosis not present

## 2014-03-13 DIAGNOSIS — Z952 Presence of prosthetic heart valve: Secondary | ICD-10-CM

## 2014-03-13 DIAGNOSIS — F0390 Unspecified dementia without behavioral disturbance: Secondary | ICD-10-CM | POA: Diagnosis present

## 2014-03-13 DIAGNOSIS — I350 Nonrheumatic aortic (valve) stenosis: Secondary | ICD-10-CM | POA: Diagnosis present

## 2014-03-13 DIAGNOSIS — Z7982 Long term (current) use of aspirin: Secondary | ICD-10-CM

## 2014-03-13 DIAGNOSIS — N390 Urinary tract infection, site not specified: Secondary | ICD-10-CM | POA: Diagnosis not present

## 2014-03-13 DIAGNOSIS — E876 Hypokalemia: Secondary | ICD-10-CM | POA: Diagnosis not present

## 2014-03-13 DIAGNOSIS — Z951 Presence of aortocoronary bypass graft: Secondary | ICD-10-CM

## 2014-03-13 DIAGNOSIS — I359 Nonrheumatic aortic valve disorder, unspecified: Principal | ICD-10-CM | POA: Diagnosis present

## 2014-03-13 DIAGNOSIS — R404 Transient alteration of awareness: Secondary | ICD-10-CM | POA: Diagnosis not present

## 2014-03-13 DIAGNOSIS — I483 Typical atrial flutter: Secondary | ICD-10-CM

## 2014-03-13 DIAGNOSIS — R32 Unspecified urinary incontinence: Secondary | ICD-10-CM | POA: Diagnosis present

## 2014-03-13 DIAGNOSIS — I4891 Unspecified atrial fibrillation: Secondary | ICD-10-CM | POA: Diagnosis not present

## 2014-03-13 DIAGNOSIS — B370 Candidal stomatitis: Secondary | ICD-10-CM | POA: Diagnosis not present

## 2014-03-13 DIAGNOSIS — E8779 Other fluid overload: Secondary | ICD-10-CM | POA: Diagnosis not present

## 2014-03-13 DIAGNOSIS — Z8249 Family history of ischemic heart disease and other diseases of the circulatory system: Secondary | ICD-10-CM

## 2014-03-13 DIAGNOSIS — R5381 Other malaise: Secondary | ICD-10-CM

## 2014-03-13 DIAGNOSIS — B961 Klebsiella pneumoniae [K. pneumoniae] as the cause of diseases classified elsewhere: Secondary | ICD-10-CM | POA: Diagnosis not present

## 2014-03-13 DIAGNOSIS — I2510 Atherosclerotic heart disease of native coronary artery without angina pectoris: Secondary | ICD-10-CM | POA: Diagnosis present

## 2014-03-13 DIAGNOSIS — Z87891 Personal history of nicotine dependence: Secondary | ICD-10-CM

## 2014-03-13 HISTORY — PX: CORONARY ARTERY BYPASS GRAFT: SHX141

## 2014-03-13 HISTORY — DX: Unspecified atrial flutter: I48.92

## 2014-03-13 HISTORY — PX: INTRAOPERATIVE TRANSESOPHAGEAL ECHOCARDIOGRAM: SHX5062

## 2014-03-13 HISTORY — PX: AORTIC VALVE REPLACEMENT: SHX41

## 2014-03-13 LAB — POCT I-STAT, CHEM 8
BUN: 29 mg/dL — ABNORMAL HIGH (ref 6–23)
BUN: 26 mg/dL — AB (ref 6–23)
BUN: 27 mg/dL — AB (ref 6–23)
BUN: 25 mg/dL — ABNORMAL HIGH (ref 6–23)
BUN: 28 mg/dL — ABNORMAL HIGH (ref 6–23)
BUN: 23 mg/dL (ref 6–23)
CALCIUM ION: 1.15 mmol/L (ref 1.13–1.30)
CALCIUM ION: 1.23 mmol/L (ref 1.13–1.30)
CHLORIDE: 108 meq/L (ref 96–112)
CHLORIDE: 103 meq/L (ref 96–112)
CHLORIDE: 107 meq/L (ref 96–112)
CHLORIDE: 104 meq/L (ref 96–112)
CREATININE: 1.1 mg/dL (ref 0.50–1.35)
CREATININE: 1 mg/dL (ref 0.50–1.35)
CREATININE: 1 mg/dL (ref 0.50–1.35)
Calcium, Ion: 1.23 mmol/L (ref 1.13–1.30)
Calcium, Ion: 1.12 mmol/L — ABNORMAL LOW (ref 1.13–1.30)
Calcium, Ion: 1.21 mmol/L (ref 1.13–1.30)
Calcium, Ion: 1.11 mmol/L — ABNORMAL LOW (ref 1.13–1.30)
Chloride: 105 mEq/L (ref 96–112)
Chloride: 114 mEq/L — ABNORMAL HIGH (ref 96–112)
Creatinine, Ser: 1.3 mg/dL (ref 0.50–1.35)
Creatinine, Ser: 1.1 mg/dL (ref 0.50–1.35)
Creatinine, Ser: 1.2 mg/dL (ref 0.50–1.35)
GLUCOSE: 90 mg/dL (ref 70–99)
GLUCOSE: 108 mg/dL — AB (ref 70–99)
Glucose, Bld: 100 mg/dL — ABNORMAL HIGH (ref 70–99)
Glucose, Bld: 107 mg/dL — ABNORMAL HIGH (ref 70–99)
Glucose, Bld: 116 mg/dL — ABNORMAL HIGH (ref 70–99)
Glucose, Bld: 121 mg/dL — ABNORMAL HIGH (ref 70–99)
HCT: 31 % — ABNORMAL LOW (ref 39.0–52.0)
HCT: 24 % — ABNORMAL LOW (ref 39.0–52.0)
HCT: 27 % — ABNORMAL LOW (ref 39.0–52.0)
HCT: 25 % — ABNORMAL LOW (ref 39.0–52.0)
HCT: 25 % — ABNORMAL LOW (ref 39.0–52.0)
HCT: 19 % — ABNORMAL LOW (ref 39.0–52.0)
HEMOGLOBIN: 8.5 g/dL — AB (ref 13.0–17.0)
HEMOGLOBIN: 6.5 g/dL — AB (ref 13.0–17.0)
Hemoglobin: 10.5 g/dL — ABNORMAL LOW (ref 13.0–17.0)
Hemoglobin: 8.2 g/dL — ABNORMAL LOW (ref 13.0–17.0)
Hemoglobin: 9.2 g/dL — ABNORMAL LOW (ref 13.0–17.0)
Hemoglobin: 8.5 g/dL — ABNORMAL LOW (ref 13.0–17.0)
POTASSIUM: 3.6 meq/L — AB (ref 3.7–5.3)
POTASSIUM: 4.7 meq/L (ref 3.7–5.3)
POTASSIUM: 4.5 meq/L (ref 3.7–5.3)
Potassium: 3.5 mEq/L — ABNORMAL LOW (ref 3.7–5.3)
Potassium: 3.9 mEq/L (ref 3.7–5.3)
Potassium: 4.3 mEq/L (ref 3.7–5.3)
SODIUM: 137 meq/L (ref 137–147)
SODIUM: 139 meq/L (ref 137–147)
SODIUM: 137 meq/L (ref 137–147)
Sodium: 141 mEq/L (ref 137–147)
Sodium: 138 mEq/L (ref 137–147)
Sodium: 141 mEq/L (ref 137–147)
TCO2: 22 mmol/L (ref 0–100)
TCO2: 23 mmol/L (ref 0–100)
TCO2: 23 mmol/L (ref 0–100)
TCO2: 22 mmol/L (ref 0–100)
TCO2: 25 mmol/L (ref 0–100)
TCO2: 19 mmol/L (ref 0–100)

## 2014-03-13 LAB — CBC
HCT: 28.8 % — ABNORMAL LOW (ref 39.0–52.0)
HCT: 22.5 % — ABNORMAL LOW (ref 39.0–52.0)
HEMOGLOBIN: 9.9 g/dL — AB (ref 13.0–17.0)
HEMOGLOBIN: 8 g/dL — AB (ref 13.0–17.0)
MCH: 30 pg (ref 26.0–34.0)
MCH: 29.7 pg (ref 26.0–34.0)
MCHC: 34.4 g/dL (ref 30.0–36.0)
MCHC: 33.8 g/dL (ref 30.0–36.0)
MCV: 87.3 fL (ref 78.0–100.0)
MCV: 87.9 fL (ref 78.0–100.0)
PLATELETS: 116 10*3/uL — AB (ref 150–400)
PLATELETS: 100 10*3/uL — AB (ref 150–400)
RBC: 3.3 MIL/uL — ABNORMAL LOW (ref 4.22–5.81)
RBC: 2.56 MIL/uL — AB (ref 4.22–5.81)
RDW: 13.5 % (ref 11.5–15.5)
RDW: 13.7 % (ref 11.5–15.5)
WBC: 9.6 10*3/uL (ref 4.0–10.5)
WBC: 8.2 10*3/uL (ref 4.0–10.5)

## 2014-03-13 LAB — APTT: aPTT: 36 seconds (ref 24–37)

## 2014-03-13 LAB — URINALYSIS, ROUTINE W REFLEX MICROSCOPIC
Bilirubin Urine: NEGATIVE
GLUCOSE, UA: NEGATIVE mg/dL
HGB URINE DIPSTICK: NEGATIVE
Ketones, ur: NEGATIVE mg/dL
LEUKOCYTES UA: NEGATIVE
Nitrite: NEGATIVE
Protein, ur: NEGATIVE mg/dL
SPECIFIC GRAVITY, URINE: 1.02 (ref 1.005–1.030)
Urobilinogen, UA: 0.2 mg/dL (ref 0.0–1.0)
pH: 5 (ref 5.0–8.0)

## 2014-03-13 LAB — POCT I-STAT 4, (NA,K, GLUC, HGB,HCT)
GLUCOSE: 65 mg/dL — AB (ref 70–99)
HEMATOCRIT: 28 % — AB (ref 39.0–52.0)
HEMOGLOBIN: 9.5 g/dL — AB (ref 13.0–17.0)
Potassium: 3.4 mEq/L — ABNORMAL LOW (ref 3.7–5.3)
Sodium: 140 mEq/L (ref 137–147)

## 2014-03-13 LAB — POCT I-STAT 3, ART BLOOD GAS (G3+)
ACID-BASE DEFICIT: 1 mmol/L (ref 0.0–2.0)
ACID-BASE DEFICIT: 3 mmol/L — AB (ref 0.0–2.0)
Acid-base deficit: 1 mmol/L (ref 0.0–2.0)
BICARBONATE: 24.6 meq/L — AB (ref 20.0–24.0)
Bicarbonate: 23.2 mEq/L (ref 20.0–24.0)
Bicarbonate: 22 mEq/L (ref 20.0–24.0)
O2 SAT: 100 %
O2 SAT: 99 %
O2 Saturation: 100 %
PCO2 ART: 36.3 mmHg (ref 35.0–45.0)
PCO2 ART: 35.3 mmHg (ref 35.0–45.0)
PO2 ART: 358 mmHg — AB (ref 80.0–100.0)
Patient temperature: 35.8
TCO2: 24 mmol/L (ref 0–100)
TCO2: 26 mmol/L (ref 0–100)
TCO2: 23 mmol/L (ref 0–100)
pCO2 arterial: 43.4 mmHg (ref 35.0–45.0)
pH, Arterial: 7.413 (ref 7.350–7.450)
pH, Arterial: 7.361 (ref 7.350–7.450)
pH, Arterial: 7.397 (ref 7.350–7.450)
pO2, Arterial: 454 mmHg — ABNORMAL HIGH (ref 80.0–100.0)
pO2, Arterial: 146 mmHg — ABNORMAL HIGH (ref 80.0–100.0)

## 2014-03-13 LAB — GLUCOSE, CAPILLARY
GLUCOSE-CAPILLARY: 65 mg/dL — AB (ref 70–99)
Glucose-Capillary: 73 mg/dL (ref 70–99)
Glucose-Capillary: 103 mg/dL — ABNORMAL HIGH (ref 70–99)
Glucose-Capillary: 107 mg/dL — ABNORMAL HIGH (ref 70–99)
Glucose-Capillary: 118 mg/dL — ABNORMAL HIGH (ref 70–99)
Glucose-Capillary: 108 mg/dL — ABNORMAL HIGH (ref 70–99)
Glucose-Capillary: 139 mg/dL — ABNORMAL HIGH (ref 70–99)

## 2014-03-13 LAB — PREPARE RBC (CROSSMATCH)

## 2014-03-13 LAB — HEMOGLOBIN AND HEMATOCRIT, BLOOD
HCT: 25.5 % — ABNORMAL LOW (ref 39.0–52.0)
HEMOGLOBIN: 8.9 g/dL — AB (ref 13.0–17.0)

## 2014-03-13 LAB — MAGNESIUM: MAGNESIUM: 2.7 mg/dL — AB (ref 1.5–2.5)

## 2014-03-13 LAB — CREATININE, SERUM
CREATININE: 1.2 mg/dL (ref 0.50–1.35)
GFR calc Af Amer: 66 mL/min — ABNORMAL LOW (ref >90)
GFR calc non Af Amer: 57 mL/min — ABNORMAL LOW (ref >90)

## 2014-03-13 LAB — PROTIME-INR
INR: 1.52 — AB (ref 0.00–1.49)
PROTHROMBIN TIME: 18.3 s — AB (ref 11.6–15.2)

## 2014-03-13 LAB — PLATELET COUNT: Platelets: 122 10*3/uL — ABNORMAL LOW (ref 150–400)

## 2014-03-13 SURGERY — CORONARY ARTERY BYPASS GRAFTING (CABG)
Anesthesia: General | Site: Chest

## 2014-03-13 MED ORDER — HEMOSTATIC AGENTS (NO CHARGE) OPTIME
TOPICAL | Status: DC | PRN
  Administered 2014-03-13: 1 via TOPICAL

## 2014-03-13 MED ORDER — DOCUSATE SODIUM 100 MG PO CAPS
200.0000 mg | ORAL_CAPSULE | Freq: Every day | ORAL | Status: DC
  Administered 2014-03-14 – 2014-03-17 (×4): 200 mg via ORAL
  Filled 2014-03-13 (×5): qty 2

## 2014-03-13 MED ORDER — THROMBIN 20000 UNITS EX SOLR
CUTANEOUS | Status: AC
  Filled 2014-03-13: qty 20000

## 2014-03-13 MED ORDER — CHLORHEXIDINE GLUCONATE 4 % EX LIQD
30.0000 mL | CUTANEOUS | Status: DC
  Filled 2014-03-13: qty 30

## 2014-03-13 MED ORDER — INSULIN ASPART 100 UNIT/ML ~~LOC~~ SOLN
0.0000 [IU] | SUBCUTANEOUS | Status: DC
  Administered 2014-03-14 (×4): 2 [IU] via SUBCUTANEOUS

## 2014-03-13 MED ORDER — MEPERIDINE HCL 25 MG/ML IJ SOLN: 6.2500 mg | INTRAMUSCULAR | Status: DC | PRN

## 2014-03-13 MED ORDER — TAMSULOSIN HCL 0.4 MG PO CAPS
0.4000 mg | ORAL_CAPSULE | Freq: Every day | ORAL | Status: DC
  Administered 2014-03-14 – 2014-03-25 (×12): 0.4 mg via ORAL
  Filled 2014-03-13 (×14): qty 1

## 2014-03-13 MED ORDER — GLYCOPYRROLATE 0.2 MG/ML IJ SOLN
INTRAMUSCULAR | Status: DC | PRN
  Administered 2014-03-13: 0.2 mg via INTRAVENOUS

## 2014-03-13 MED ORDER — PROPOFOL 10 MG/ML IV BOLUS
INTRAVENOUS | Status: AC
  Filled 2014-03-13: qty 20

## 2014-03-13 MED ORDER — ACETAMINOPHEN 160 MG/5ML PO SOLN: 650.0000 mg | Freq: Once | ORAL | Status: AC

## 2014-03-13 MED ORDER — DOPAMINE-DEXTROSE 3.2-5 MG/ML-% IV SOLN
3.0000 ug/kg/min | INTRAVENOUS | Status: DC
  Administered 2014-03-13: 3 ug/kg/min via INTRAVENOUS

## 2014-03-13 MED ORDER — MIDAZOLAM HCL 2 MG/2ML IJ SOLN: 2.0000 mg | INTRAMUSCULAR | Status: DC | PRN

## 2014-03-13 MED ORDER — ASPIRIN EC 325 MG PO TBEC
325.0000 mg | DELAYED_RELEASE_TABLET | Freq: Every day | ORAL | Status: DC
  Administered 2014-03-14 – 2014-03-17 (×4): 325 mg via ORAL
  Filled 2014-03-13 (×5): qty 1

## 2014-03-13 MED ORDER — LACTATED RINGERS IV SOLN
INTRAVENOUS | Status: DC
  Administered 2014-03-13: 15:00:00 via INTRAVENOUS

## 2014-03-13 MED ORDER — SODIUM CHLORIDE 0.9 % IJ SOLN
3.0000 mL | Freq: Two times a day (BID) | INTRAMUSCULAR | Status: DC
  Administered 2014-03-14: 3 mL via INTRAVENOUS
  Administered 2014-03-15: via INTRAVENOUS

## 2014-03-13 MED ORDER — ACETAMINOPHEN 160 MG/5ML PO SOLN: 1000.0000 mg | Freq: Four times a day (QID) | ORAL | Status: DC

## 2014-03-13 MED ORDER — DEXTROSE 5 % IV SOLN
1.5000 g | Freq: Two times a day (BID) | INTRAVENOUS | Status: AC
  Administered 2014-03-13 – 2014-03-15 (×4): 1.5 g via INTRAVENOUS
  Filled 2014-03-13 (×4): qty 1.5

## 2014-03-13 MED ORDER — ROCURONIUM BROMIDE 50 MG/5ML IV SOLN
INTRAVENOUS | Status: AC
  Filled 2014-03-13: qty 1

## 2014-03-13 MED ORDER — DONEPEZIL HCL 5 MG PO TABS
5.0000 mg | ORAL_TABLET | Freq: Every day | ORAL | Status: DC
  Administered 2014-03-14 – 2014-03-24 (×11): 5 mg via ORAL
  Filled 2014-03-13 (×13): qty 1

## 2014-03-13 MED ORDER — PROMETHAZINE HCL 25 MG/ML IJ SOLN: 6.2500 mg | INTRAMUSCULAR | Status: DC | PRN

## 2014-03-13 MED ORDER — EPHEDRINE SULFATE 50 MG/ML IJ SOLN
INTRAMUSCULAR | Status: DC | PRN
  Administered 2014-03-13: 10 mg via INTRAVENOUS

## 2014-03-13 MED ORDER — FENTANYL CITRATE 0.05 MG/ML IJ SOLN
INTRAMUSCULAR | Status: DC | PRN
  Administered 2014-03-13 (×3): 100 ug via INTRAVENOUS
  Administered 2014-03-13: 300 ug via INTRAVENOUS
  Administered 2014-03-13: 100 ug via INTRAVENOUS
  Administered 2014-03-13 (×2): 250 ug via INTRAVENOUS
  Administered 2014-03-13: 50 ug via INTRAVENOUS

## 2014-03-13 MED ORDER — POTASSIUM CHLORIDE 10 MEQ/50ML IV SOLN
10.0000 meq | INTRAVENOUS | Status: AC
  Administered 2014-03-13 (×3): 10 meq via INTRAVENOUS

## 2014-03-13 MED ORDER — HEPARIN SODIUM (PORCINE) 1000 UNIT/ML IJ SOLN
INTRAMUSCULAR | Status: AC
  Filled 2014-03-13: qty 1

## 2014-03-13 MED ORDER — ACETAMINOPHEN 650 MG RE SUPP
650.0000 mg | Freq: Once | RECTAL | Status: AC
  Administered 2014-03-13: 650 mg via RECTAL

## 2014-03-13 MED ORDER — PHENYLEPHRINE HCL 10 MG/ML IJ SOLN
0.0000 ug/min | INTRAMUSCULAR | Status: DC
  Administered 2014-03-13: 10 ug/min via INTRAVENOUS
  Administered 2014-03-14: 40 ug/min via INTRAVENOUS
  Filled 2014-03-13 (×3): qty 2

## 2014-03-13 MED ORDER — SODIUM CHLORIDE 0.9 % IV SOLN: 250.0000 mL | INTRAVENOUS | Status: DC

## 2014-03-13 MED ORDER — MIDAZOLAM HCL 5 MG/5ML IJ SOLN
INTRAMUSCULAR | Status: DC | PRN
  Administered 2014-03-13: 2 mg via INTRAVENOUS
  Administered 2014-03-13: 4 mg via INTRAVENOUS
  Administered 2014-03-13: 5 mg via INTRAVENOUS
  Administered 2014-03-13: 1 mg via INTRAVENOUS

## 2014-03-13 MED ORDER — LIDOCAINE HCL (CARDIAC) 20 MG/ML IV SOLN
INTRAVENOUS | Status: AC
Start: 2014-03-13 — End: 2014-03-13
  Filled 2014-03-13: qty 5

## 2014-03-13 MED ORDER — METOPROLOL TARTRATE 12.5 MG HALF TABLET
12.5000 mg | ORAL_TABLET | Freq: Two times a day (BID) | ORAL | Status: DC
  Administered 2014-03-15 – 2014-03-20 (×10): 12.5 mg via ORAL
  Filled 2014-03-13 (×17): qty 1

## 2014-03-13 MED ORDER — CALCIUM CHLORIDE 10 % IV SOLN
1.0000 g | Freq: Once | INTRAVENOUS | Status: AC
Start: 2014-03-13 — End: 2014-03-13
  Administered 2014-03-13: 1 g via INTRAVENOUS

## 2014-03-13 MED ORDER — METOPROLOL TARTRATE 12.5 MG HALF TABLET
ORAL_TABLET | ORAL | Status: DC
Start: 2014-03-13 — End: 2014-03-13
  Filled 2014-03-13: qty 1

## 2014-03-13 MED ORDER — MIDAZOLAM HCL 2 MG/2ML IJ SOLN
INTRAMUSCULAR | Status: AC
  Filled 2014-03-13: qty 2

## 2014-03-13 MED ORDER — INSULIN REGULAR BOLUS VIA INFUSION
0.0000 [IU] | Freq: Three times a day (TID) | INTRAVENOUS | Status: DC
  Filled 2014-03-13: qty 10

## 2014-03-13 MED ORDER — LIDOCAINE HCL (CARDIAC) 20 MG/ML IV SOLN
INTRAVENOUS | Status: DC | PRN
  Administered 2014-03-13: 60 mg via INTRAVENOUS

## 2014-03-13 MED ORDER — PROPOFOL 10 MG/ML IV BOLUS
INTRAVENOUS | Status: DC | PRN
  Administered 2014-03-13: 50 mg via INTRAVENOUS
  Administered 2014-03-13: 30 mg via INTRAVENOUS
  Administered 2014-03-13: 20 mg via INTRAVENOUS

## 2014-03-13 MED ORDER — LACTATED RINGERS IV SOLN
INTRAVENOUS | Status: DC | PRN
  Administered 2014-03-13 (×3): via INTRAVENOUS

## 2014-03-13 MED ORDER — FAMOTIDINE IN NACL 20-0.9 MG/50ML-% IV SOLN
20.0000 mg | Freq: Two times a day (BID) | INTRAVENOUS | Status: AC
  Administered 2014-03-13 (×2): 20 mg via INTRAVENOUS
  Filled 2014-03-13: qty 50

## 2014-03-13 MED ORDER — FENTANYL CITRATE 0.05 MG/ML IJ SOLN
INTRAMUSCULAR | Status: AC
  Filled 2014-03-13: qty 5

## 2014-03-13 MED ORDER — HYDROMORPHONE HCL PF 1 MG/ML IJ SOLN: 0.2500 mg | INTRAMUSCULAR | Status: DC | PRN

## 2014-03-13 MED ORDER — PROTAMINE SULFATE 10 MG/ML IV SOLN
INTRAVENOUS | Status: AC
  Filled 2014-03-13: qty 25

## 2014-03-13 MED ORDER — SODIUM CHLORIDE 0.9 % IJ SOLN
INTRAMUSCULAR | Status: AC
  Filled 2014-03-13: qty 10

## 2014-03-13 MED ORDER — MORPHINE SULFATE 2 MG/ML IJ SOLN
2.0000 mg | INTRAMUSCULAR | Status: DC | PRN
  Administered 2014-03-13: 4 mg via INTRAVENOUS
  Administered 2014-03-14 (×2): 2 mg via INTRAVENOUS
  Administered 2014-03-14 (×2): 4 mg via INTRAVENOUS
  Filled 2014-03-13: qty 2
  Filled 2014-03-13: qty 3
  Filled 2014-03-13: qty 1
  Filled 2014-03-13: qty 2
  Filled 2014-03-13 (×2): qty 1

## 2014-03-13 MED ORDER — MEMANTINE HCL ER 28 MG PO CP24
1.0000 | ORAL_CAPSULE | Freq: Every morning | ORAL | Status: DC
  Administered 2014-03-14 – 2014-03-25 (×12): 28 mg via ORAL
  Filled 2014-03-13 (×12): qty 28

## 2014-03-13 MED ORDER — HEPARIN SODIUM (PORCINE) 1000 UNIT/ML IJ SOLN
INTRAMUSCULAR | Status: DC | PRN
  Administered 2014-03-13: 25000 [IU] via INTRAVENOUS

## 2014-03-13 MED ORDER — PHENYLEPHRINE HCL 10 MG/ML IJ SOLN
10.0000 mg | INTRAVENOUS | Status: DC | PRN
  Administered 2014-03-13: 25 ug/min via INTRAVENOUS

## 2014-03-13 MED ORDER — SODIUM CHLORIDE 0.9 % IV SOLN
INTRAVENOUS | Status: DC
  Administered 2014-03-13: 15:00:00 via INTRAVENOUS

## 2014-03-13 MED ORDER — OXYCODONE HCL 5 MG/5ML PO SOLN: 5.0000 mg | Freq: Once | ORAL | Status: AC | PRN

## 2014-03-13 MED ORDER — COQ10 200 MG PO CAPS: 1.0000 | ORAL_CAPSULE | Freq: Every morning | ORAL | Status: DC

## 2014-03-13 MED ORDER — MORPHINE SULFATE 2 MG/ML IJ SOLN
1.0000 mg | INTRAMUSCULAR | Status: AC | PRN
  Administered 2014-03-13: 2 mg via INTRAVENOUS
  Filled 2014-03-13: qty 1

## 2014-03-13 MED ORDER — MIDAZOLAM HCL 10 MG/2ML IJ SOLN
INTRAMUSCULAR | Status: AC
  Filled 2014-03-13: qty 2

## 2014-03-13 MED ORDER — PHENYLEPHRINE 40 MCG/ML (10ML) SYRINGE FOR IV PUSH (FOR BLOOD PRESSURE SUPPORT)
PREFILLED_SYRINGE | INTRAVENOUS | Status: AC
  Filled 2014-03-13: qty 10

## 2014-03-13 MED ORDER — SUCCINYLCHOLINE CHLORIDE 20 MG/ML IJ SOLN
INTRAMUSCULAR | Status: AC
  Filled 2014-03-13: qty 1

## 2014-03-13 MED ORDER — SODIUM CHLORIDE 0.45 % IV SOLN
INTRAVENOUS | Status: DC
  Administered 2014-03-13: 15:00:00 via INTRAVENOUS

## 2014-03-13 MED ORDER — METOPROLOL TARTRATE 1 MG/ML IV SOLN
2.5000 mg | INTRAVENOUS | Status: DC | PRN
  Filled 2014-03-13 (×2): qty 5

## 2014-03-13 MED ORDER — ACETAMINOPHEN 500 MG PO TABS
1000.0000 mg | ORAL_TABLET | Freq: Four times a day (QID) | ORAL | Status: AC
  Administered 2014-03-14 – 2014-03-18 (×18): 1000 mg via ORAL
  Filled 2014-03-13 (×20): qty 2

## 2014-03-13 MED ORDER — OXYCODONE HCL 5 MG PO TABS
5.0000 mg | ORAL_TABLET | ORAL | Status: DC | PRN
  Administered 2014-03-14 – 2014-03-15 (×4): 10 mg via ORAL
  Filled 2014-03-13 (×4): qty 2

## 2014-03-13 MED ORDER — DEXMEDETOMIDINE HCL IN NACL 400 MCG/100ML IV SOLN
0.4000 ug/kg/h | INTRAVENOUS | Status: DC
  Filled 2014-03-13: qty 100

## 2014-03-13 MED ORDER — METOPROLOL TARTRATE 25 MG/10 ML ORAL SUSPENSION
12.5000 mg | Freq: Two times a day (BID) | ORAL | Status: DC
  Filled 2014-03-13 (×5): qty 5

## 2014-03-13 MED ORDER — PROTAMINE SULFATE 10 MG/ML IV SOLN
INTRAVENOUS | Status: DC | PRN
  Administered 2014-03-13: 20 mg via INTRAVENOUS
  Administered 2014-03-13: 30 mg via INTRAVENOUS
  Administered 2014-03-13: 20 mg via INTRAVENOUS
  Administered 2014-03-13: 10 mg via INTRAVENOUS
  Administered 2014-03-13 (×6): 20 mg via INTRAVENOUS

## 2014-03-13 MED ORDER — DEXMEDETOMIDINE HCL IN NACL 200 MCG/50ML IV SOLN
0.1000 ug/kg/h | INTRAVENOUS | Status: DC
  Administered 2014-03-13: 0.7 ug/kg/h via INTRAVENOUS

## 2014-03-13 MED ORDER — ONDANSETRON HCL 4 MG/2ML IJ SOLN: 4.0000 mg | Freq: Four times a day (QID) | INTRAMUSCULAR | Status: DC | PRN

## 2014-03-13 MED ORDER — NITROGLYCERIN IN D5W 200-5 MCG/ML-% IV SOLN: 0.0000 ug/min | INTRAVENOUS | Status: DC

## 2014-03-13 MED ORDER — BISACODYL 5 MG PO TBEC
10.0000 mg | DELAYED_RELEASE_TABLET | Freq: Every day | ORAL | Status: DC
  Administered 2014-03-15 – 2014-03-19 (×3): 10 mg via ORAL
  Filled 2014-03-13 (×4): qty 2

## 2014-03-13 MED ORDER — THROMBIN 20000 UNITS EX SOLR
CUTANEOUS | Status: DC | PRN
  Administered 2014-03-13: 20000 [IU] via TOPICAL

## 2014-03-13 MED ORDER — ALBUMIN HUMAN 5 % IV SOLN
250.0000 mL | INTRAVENOUS | Status: AC | PRN
  Administered 2014-03-13 (×3): 250 mL via INTRAVENOUS
  Filled 2014-03-13 (×2): qty 250

## 2014-03-13 MED ORDER — ASPIRIN 81 MG PO CHEW: 324.0000 mg | CHEWABLE_TABLET | Freq: Every day | ORAL | Status: DC

## 2014-03-13 MED ORDER — LACTATED RINGERS IV SOLN: 500.0000 mL | Freq: Once | INTRAVENOUS | Status: AC | PRN

## 2014-03-13 MED ORDER — VANCOMYCIN HCL IN DEXTROSE 1-5 GM/200ML-% IV SOLN
1000.0000 mg | Freq: Once | INTRAVENOUS | Status: AC
Start: 2014-03-13 — End: 2014-03-13
  Administered 2014-03-13: 1000 mg via INTRAVENOUS
  Filled 2014-03-13: qty 200

## 2014-03-13 MED ORDER — THROMBIN 20000 UNITS EX SOLR
OROMUCOSAL | Status: DC | PRN
  Administered 2014-03-13: 10:00:00 via TOPICAL

## 2014-03-13 MED ORDER — MAGNESIUM SULFATE 4000MG/100ML IJ SOLN
4.0000 g | Freq: Once | INTRAMUSCULAR | Status: AC
  Administered 2014-03-13: 4 g via INTRAVENOUS
  Filled 2014-03-13: qty 100

## 2014-03-13 MED ORDER — 0.9 % SODIUM CHLORIDE (POUR BTL) OPTIME
TOPICAL | Status: DC | PRN
  Administered 2014-03-13: 6000 mL

## 2014-03-13 MED ORDER — BISACODYL 10 MG RE SUPP: 10.0000 mg | Freq: Every day | RECTAL | Status: DC

## 2014-03-13 MED ORDER — PANTOPRAZOLE SODIUM 40 MG PO TBEC
40.0000 mg | DELAYED_RELEASE_TABLET | Freq: Every day | ORAL | Status: DC
  Administered 2014-03-15 – 2014-03-17 (×3): 40 mg via ORAL
  Filled 2014-03-13 (×3): qty 1

## 2014-03-13 MED ORDER — OXYCODONE HCL 5 MG PO TABS: 5.0000 mg | ORAL_TABLET | Freq: Once | ORAL | Status: AC | PRN

## 2014-03-13 MED ORDER — SODIUM CHLORIDE 0.9 % IJ SOLN: 3.0000 mL | INTRAMUSCULAR | Status: DC | PRN

## 2014-03-13 MED ORDER — SODIUM CHLORIDE 0.9 % IV SOLN
INTRAVENOUS | Status: DC
  Filled 2014-03-13: qty 1

## 2014-03-13 MED ORDER — ALBUMIN HUMAN 5 % IV SOLN
INTRAVENOUS | Status: DC | PRN
  Administered 2014-03-13: 13:00:00 via INTRAVENOUS

## 2014-03-13 MED ORDER — ROCURONIUM BROMIDE 100 MG/10ML IV SOLN
INTRAVENOUS | Status: DC | PRN
  Administered 2014-03-13: 50 mg via INTRAVENOUS
  Administered 2014-03-13: 40 mg via INTRAVENOUS
  Administered 2014-03-13: 50 mg via INTRAVENOUS
  Administered 2014-03-13: 20 mg via INTRAVENOUS
  Administered 2014-03-13: 30 mg via INTRAVENOUS

## 2014-03-13 SURGICAL SUPPLY — 112 items
ADAPTER CARDIO PERF ANTE/RETRO (ADAPTER) ×3 IMPLANT
ADH SKN CLS APL DERMABOND .7 (GAUZE/BANDAGES/DRESSINGS) ×2
ADPR PRFSN 84XANTGRD RTRGD (ADAPTER) ×2
ATTRACTOMAT 16X20 MAGNETIC DRP (DRAPES) ×3 IMPLANT
BAG DECANTER FOR FLEXI CONT (MISCELLANEOUS) ×3 IMPLANT
BANDAGE ELASTIC 4 VELCRO ST LF (GAUZE/BANDAGES/DRESSINGS) ×3 IMPLANT
BANDAGE ELASTIC 6 VELCRO ST LF (GAUZE/BANDAGES/DRESSINGS) ×3 IMPLANT
BANDAGE GAUZE ELAST BULKY 4 IN (GAUZE/BANDAGES/DRESSINGS) ×3 IMPLANT
BASKET HEART (ORDER IN 25'S) (MISCELLANEOUS) ×1
BASKET HEART (ORDER IN 25S) (MISCELLANEOUS) ×2 IMPLANT
BLADE STERNUM SYSTEM 6 (BLADE) ×3 IMPLANT
BLADE SURG 15 STRL LF DISP TIS (BLADE) ×2 IMPLANT
BLADE SURG 15 STRL SS (BLADE) ×3
CANISTER SUCTION 2500CC (MISCELLANEOUS) ×3 IMPLANT
CANNULA GUNDRY RCSP 15FR (MISCELLANEOUS) ×3 IMPLANT
CARDIAC SUCTION (MISCELLANEOUS) ×3 IMPLANT
CATH ROBINSON RED A/P 18FR (CATHETERS) ×9 IMPLANT
CATH THORACIC 28FR (CATHETERS) ×3 IMPLANT
CATH THORACIC 36FR (CATHETERS) ×3 IMPLANT
CATH THORACIC 36FR RT ANG (CATHETERS) ×3 IMPLANT
CLIP TI MEDIUM 24 (CLIP) IMPLANT
CLIP TI WIDE RED SMALL 24 (CLIP) IMPLANT
CONT SPEC STER OR (MISCELLANEOUS) ×3 IMPLANT
COVER SURGICAL LIGHT HANDLE (MISCELLANEOUS) ×3 IMPLANT
CRADLE DONUT ADULT HEAD (MISCELLANEOUS) ×3 IMPLANT
DERMABOND ADVANCED (GAUZE/BANDAGES/DRESSINGS) ×1
DERMABOND ADVANCED .7 DNX12 (GAUZE/BANDAGES/DRESSINGS) IMPLANT
DRAPE CARDIOVASCULAR INCISE (DRAPES) ×3
DRAPE SLUSH/WARMER DISC (DRAPES) ×3 IMPLANT
DRAPE SRG 135X102X78XABS (DRAPES) ×2 IMPLANT
DRSG COVADERM 4X14 (GAUZE/BANDAGES/DRESSINGS) ×3 IMPLANT
ELECT CAUTERY BLADE 6.4 (BLADE) ×3 IMPLANT
ELECT REM PT RETURN 9FT ADLT (ELECTROSURGICAL) ×6
ELECTRODE REM PT RTRN 9FT ADLT (ELECTROSURGICAL) ×4 IMPLANT
GLOVE BIO SURGEON STRL SZ 6 (GLOVE) ×2 IMPLANT
GLOVE BIO SURGEON STRL SZ 6.5 (GLOVE) ×2 IMPLANT
GLOVE BIO SURGEON STRL SZ7 (GLOVE) IMPLANT
GLOVE BIO SURGEON STRL SZ7.5 (GLOVE) IMPLANT
GLOVE BIOGEL PI IND STRL 6 (GLOVE) IMPLANT
GLOVE BIOGEL PI IND STRL 6.5 (GLOVE) IMPLANT
GLOVE BIOGEL PI IND STRL 7.0 (GLOVE) IMPLANT
GLOVE BIOGEL PI INDICATOR 6 (GLOVE) ×2
GLOVE BIOGEL PI INDICATOR 6.5 (GLOVE) ×2
GLOVE BIOGEL PI INDICATOR 7.0 (GLOVE)
GLOVE EUDERMIC 7 POWDERFREE (GLOVE) ×6 IMPLANT
GLOVE ORTHO TXT STRL SZ7.5 (GLOVE) IMPLANT
GOWN STRL REUS W/ TWL LRG LVL3 (GOWN DISPOSABLE) ×8 IMPLANT
GOWN STRL REUS W/ TWL XL LVL3 (GOWN DISPOSABLE) ×2 IMPLANT
GOWN STRL REUS W/TWL LRG LVL3 (GOWN DISPOSABLE) ×12
GOWN STRL REUS W/TWL XL LVL3 (GOWN DISPOSABLE) ×3
HEART VENT LT CURVED (MISCELLANEOUS) ×3 IMPLANT
HEMOSTAT POWDER SURGIFOAM 1G (HEMOSTASIS) ×9 IMPLANT
HEMOSTAT SURGICEL 2X14 (HEMOSTASIS) ×3 IMPLANT
INSERT FOGARTY 61MM (MISCELLANEOUS) IMPLANT
INSERT FOGARTY XLG (MISCELLANEOUS) IMPLANT
KIT BASIN OR (CUSTOM PROCEDURE TRAY) ×3 IMPLANT
KIT CATH CPB BARTLE (MISCELLANEOUS) ×3 IMPLANT
KIT ROOM TURNOVER OR (KITS) ×3 IMPLANT
KIT SUCTION CATH 14FR (SUCTIONS) ×3 IMPLANT
KIT VASOVIEW W/TROCAR VH 2000 (KITS) ×3 IMPLANT
LINE VENT (MISCELLANEOUS) ×1 IMPLANT
NS IRRIG 1000ML POUR BTL (IV SOLUTION) ×18 IMPLANT
PACK OPEN HEART (CUSTOM PROCEDURE TRAY) ×3 IMPLANT
PAD ARMBOARD 7.5X6 YLW CONV (MISCELLANEOUS) ×6 IMPLANT
PAD ELECT DEFIB RADIOL ZOLL (MISCELLANEOUS) ×3 IMPLANT
PENCIL BUTTON HOLSTER BLD 10FT (ELECTRODE) ×3 IMPLANT
PUNCH AORTIC ROTATE 4.0MM (MISCELLANEOUS) IMPLANT
PUNCH AORTIC ROTATE 4.5MM 8IN (MISCELLANEOUS) ×3 IMPLANT
PUNCH AORTIC ROTATE 5MM 8IN (MISCELLANEOUS) IMPLANT
SET CARDIOPLEGIA MPS 5001102 (MISCELLANEOUS) ×1 IMPLANT
SPONGE GAUZE 4X4 12PLY (GAUZE/BANDAGES/DRESSINGS) ×7 IMPLANT
SPONGE INTESTINAL PEANUT (DISPOSABLE) IMPLANT
SPONGE LAP 18X18 X RAY DECT (DISPOSABLE) IMPLANT
SPONGE LAP 4X18 X RAY DECT (DISPOSABLE) ×3 IMPLANT
SUT BONE WAX W31G (SUTURE) ×3 IMPLANT
SUT ETHIBON 2 0 V 52N 30 (SUTURE) ×6 IMPLANT
SUT MNCRL AB 4-0 PS2 18 (SUTURE) IMPLANT
SUT PROLENE 3 0 SH DA (SUTURE) IMPLANT
SUT PROLENE 3 0 SH1 36 (SUTURE) ×3 IMPLANT
SUT PROLENE 4 0 RB 1 (SUTURE) ×15
SUT PROLENE 4 0 SH DA (SUTURE) IMPLANT
SUT PROLENE 4-0 RB1 .5 CRCL 36 (SUTURE) ×6 IMPLANT
SUT PROLENE 5 0 C 1 36 (SUTURE) IMPLANT
SUT PROLENE 6 0 C 1 30 (SUTURE) IMPLANT
SUT PROLENE 7 0 BV 1 (SUTURE) IMPLANT
SUT PROLENE 7 0 BV1 MDA (SUTURE) ×3 IMPLANT
SUT PROLENE 8 0 BV175 6 (SUTURE) IMPLANT
SUT SILK  1 MH (SUTURE)
SUT SILK 1 MH (SUTURE) IMPLANT
SUT STEEL 6MS V (SUTURE) ×2 IMPLANT
SUT STEEL STERNAL CCS#1 18IN (SUTURE) IMPLANT
SUT STEEL SZ 6 DBL 3X14 BALL (SUTURE) IMPLANT
SUT VIC AB 1 CTX 36 (SUTURE) ×6
SUT VIC AB 1 CTX36XBRD ANBCTR (SUTURE) ×4 IMPLANT
SUT VIC AB 2-0 CT1 27 (SUTURE)
SUT VIC AB 2-0 CT1 TAPERPNT 27 (SUTURE) IMPLANT
SUT VIC AB 2-0 CTX 27 (SUTURE) IMPLANT
SUT VIC AB 3-0 SH 27 (SUTURE)
SUT VIC AB 3-0 SH 27X BRD (SUTURE) IMPLANT
SUT VIC AB 3-0 X1 27 (SUTURE) IMPLANT
SUT VICRYL 4-0 PS2 18IN ABS (SUTURE) IMPLANT
SUTURE E-PAK OPEN HEART (SUTURE) ×3 IMPLANT
SYSTEM SAHARA CHEST DRAIN ATS (WOUND CARE) ×3 IMPLANT
TAPE CLOTH SOFT 2X10 (GAUZE/BANDAGES/DRESSINGS) ×1 IMPLANT
TAPE CLOTH SURG 4X10 WHT LF (GAUZE/BANDAGES/DRESSINGS) ×1 IMPLANT
TOWEL OR 17X24 6PK STRL BLUE (TOWEL DISPOSABLE) ×6 IMPLANT
TOWEL OR 17X26 10 PK STRL BLUE (TOWEL DISPOSABLE) ×6 IMPLANT
TRAY FOLEY IC TEMP SENS 16FR (CATHETERS) ×3 IMPLANT
TUBING INSUFFLATION 10FT LAP (TUBING) ×3 IMPLANT
UNDERPAD 30X30 INCONTINENT (UNDERPADS AND DIAPERS) ×3 IMPLANT
VALVE MAGNA EASE AORTIC 23MM (Prosthesis & Implant Heart) ×1 IMPLANT
WATER STERILE IRR 1000ML POUR (IV SOLUTION) ×6 IMPLANT

## 2014-03-13 NOTE — Anesthesia Procedure Notes (Signed)
Procedure Name: Intubation Date/Time: 03/13/2014 9:30 AM Performed by: Maeola Harman Pre-anesthesia Checklist: Patient identified, Emergency Drugs available, Suction available and Patient being monitored Patient Re-evaluated:Patient Re-evaluated prior to inductionOxygen Delivery Method: Circle system utilized Preoxygenation: Pre-oxygenation with 100% oxygen Intubation Type: IV induction Ventilation: Mask ventilation without difficulty Laryngoscope Size: Mac and 4 Grade View: Grade I Tube type: Oral Tube size: 8.0 mm Number of attempts: 1 Airway Equipment and Method: Stylet Secured at: 23 cm Tube secured with: Tape Dental Injury: Teeth and Oropharynx as per pre-operative assessment  Comments: Easy atraumatic induction and intubation with MAC 4 blade.  Dr. Albertina Parr verified placement of ETT.  [name] Session, CRNA

## 2014-03-13 NOTE — Progress Notes (Signed)
PHARMACIST - PHYSICIAN ORDER COMMUNICATION  CONCERNING: P&T Medication Policy on Herbal Medications  DESCRIPTION:  This patient's order for:  Co Q-10  has been noted.  This product(s) is classified as an "herbal" or natural product. Due to a lack of definitive safety studies or FDA approval, nonstandard manufacturing practices, plus the potential risk of unknown drug-drug interactions while on inpatient medications, the Pharmacy and Therapeutics Committee does not permit the use of "herbal" or natural products of this type within [facility].   ACTION TAKEN: The pharmacy department is unable to verify this order at this time. Please reevaluate patient's clinical condition at discharge and address if the herbal or natural product(s) should be resumed at that time.  Kelvin Cellar, RPh 03/13/2014 2:33 PM

## 2014-03-13 NOTE — Anesthesia Postprocedure Evaluation (Signed)
  Anesthesia Post-op Note  Patient: [name] Galloway  Procedure(s) Performed: Procedure(s): Coronary Artery Bypass Grafting times one using right greater saphenous vein via endovein harvest. (N/A) AORTIC VALVE REPLACEMENT (AVR) (N/A) INTRAOPERATIVE TRANSESOPHAGEAL ECHOCARDIOGRAM (N/A)  Patient Location: SICU  Anesthesia Type:General  Level of Consciousness: sedated and Patient remains intubated per anesthesia plan  Airway and Oxygen Therapy: Patient remains intubated per anesthesia plan and Patient placed on Ventilator (see vital sign flow sheet for setting)  Post-op Pain: none  Post-op Assessment: Post-op Vital signs reviewed, Patient's Cardiovascular Status Stable, Respiratory Function Stable, Patent Airway, No signs of Nausea or vomiting and Pain level controlled  Post-op Vital Signs: Reviewed and stable  Last Vitals:  Filed Vitals:   03/13/14 1431  BP: 130/78  Pulse: 80  Temp:   Resp: 12    Complications: No apparent anesthesia complications

## 2014-03-13 NOTE — OR Nursing (Signed)
1st call to SICU 

## 2014-03-13 NOTE — Transfer of Care (Signed)
Immediate Anesthesia Transfer of Care Note  Patient: [name] Galloway  Procedure(s) Performed: Procedure(s): Coronary Artery Bypass Grafting times one using right greater saphenous vein via endovein harvest. (N/A) AORTIC VALVE REPLACEMENT (AVR) (N/A) INTRAOPERATIVE TRANSESOPHAGEAL ECHOCARDIOGRAM (N/A)  Patient Location: SICU  Anesthesia Type:General  Level of Consciousness: Patient remains intubated per anesthesia plan  Airway & Oxygen Therapy: Patient remains intubated per anesthesia plan  Post-op Assessment: Post -op Vital signs reviewed and stable  Post vital signs: stable  Complications: No apparent anesthesia complications

## 2014-03-13 NOTE — H&P (Signed)
[address]       [place] [address]             [phone]        Cardiothoracic Surgery History and Physical   PCP is Jani Gravel, MD  Referring Provider is Jettie Booze, MD   Chief Complaint   Patient presents with   .  Aortic Stenosis and 90% proximal RCA stenosis       HPI:  This patient is a very unfortunate gentleman with a history of moderate to severe aortic stenosis in the past by his prior echo in 2013 who has a history of sigmoid volvulus as well as bowel and bladder incontinence. He was scheduled for colon resection but his preop evaluation including an echo showed severe AS with a mean gradient of 38 and a peak of 59 with an AVA of 0.5 cm2. His LVEF was 55-60%. Anesthesia was concerned about putting him under and surgery was postponed. The patient has some dementia which his wife says has been getting worse over the past 6 months. He remains very active and denies and shortness of breath or chest discomfort. He spent all day yesterday digging out stumps from his yard. He does report some dizziness with bending over if it is really hot outside. I saw him in the office on 02/14/2014 and felt that AVR was the best treatment for him. He underwent R and L heart cath which showed a 90% RCA stenosis in the proximal RCA. Right heart pressures and cardiac output were normal.  Past Medical History   Diagnosis  Date   .  Coronary artery disease    .  Dementia    .  Psoriasis    .  Sigmoid volvulus    .  Incontinence of bowel    .  Incontinence of urine    .  Anemia    .  Hypertension      CONTROLLED SINCE WEIGHT LOSS    Past Surgical History   Procedure  Laterality  Date   .  Colonoscopy   2011     Blockage   .  Flexible sigmoidoscopy   09/20/2011     Procedure: FLEXIBLE SIGMOIDOSCOPY; Surgeon: Jeryl Columbia, MD; Location: [facility] ENDOSCOPY; Service: Endoscopy; Laterality: N/A;   .  Hernia repair      Family History   Problem  Relation  Age of  Onset   .  Cancer  Mother    .  Hypertension  Mother    .  Cancer  Father    .  Cancer  Sister    .  Heart attack  Son    .  Heart disease  Son      before age 66   Social History  History   Substance Use Topics   .  Smoking status:  Former Smoker     Quit date:  09/20/1971   .  Smokeless tobacco:  Never Used   .  Alcohol Use:  No    Current Outpatient Prescriptions   Medication  Sig  Dispense  Refill   .  aspirin 81 MG tablet  Take 81 mg by mouth daily.     .  Coenzyme Q10 (COQ10) 200 MG CAPS  Take 1 capsule by mouth daily.     .  Memantine HCl ER (NAMENDA XR) 28 MG CP24  Take 1 capsule by mouth daily.     .  potassium  chloride SA (K-DUR,KLOR-CON) 20 MEQ tablet  Take 2 tablets (40 mEq total) by mouth daily.  14 tablet  0   .  tamsulosin (FLOMAX) 0.4 MG CAPS capsule  Take 0.4 mg by mouth daily.     .  Phosphatidylserine-DHA-EPA (VAYACOG) 100-19.5-6.5 MG CAPS  Take 1 capsule by mouth daily.      No current facility-administered medications for this visit.    Allergies   Allergen  Reactions   .  Ciprofloxacin  Hives    Review of Systems   Constitutional: Negative for fever, diaphoresis, activity change, fatigue and unexpected weight change.  HENT: Negative.  Has not seen a dentist in a few years due to inability to afford it.  Eyes: Negative.  Respiratory: Negative. Negative for shortness of breath.  Cardiovascular: Negative for chest pain, palpitations and leg swelling.  Gastrointestinal: Positive for diarrhea.  Endocrine: Negative.  Genitourinary:  Incontinence  Allergic/Immunologic: Negative.  Neurological: Negative for dizziness and syncope.  Hematological: Negative.  Psychiatric/Behavioral:  Dementia with memory loss and confusion.    BP 125/59  Pulse 72  Resp 20  Ht 6\' 1"  (1.854 m)  Wt 157 lb (71.215 kg)  BMI 20.72 kg/m2  SpO2 97%   Physical Exam   Alert and oriented x 3 HEENT: NCAT, PERLA, EOMI, throat clear Neck: normal carotid pulses. Transmitted  murmur to both sides of neck. No adenopathy or thyromegaly. Lungs: clear Heart:  RRR, 3/6 systolic murmur along RSB Abdomen: bowel sounds normal, nontender, no masses or organomegaly Ext: warm and well-perfused. No edema. Pedal pulses palpable Neuro: motor and sensory grossly intact   Diagnostic Tests:   *[place]* *[employer] [place], [place] [address] [phone]  ------------------------------------------------------------ Transthoracic Echocardiography  Patient: [name] Galloway, [name] Galloway MR #: [id number] Study Date: 12/26/2013 Gender: M Age: 79 Height: 185.4cm Weight: 63.6kg BSA: 1.15m^2 Pt. Status: Room:  REFERRING Jani Gravel SONOGRAPHER Tresa Res, RDCS ATTENDING Pamella Pert, Lance Coon, John PERFORMING [facility], Outpatient cc:  ------------------------------------------------------------ LV EF: 55% - 60%  ------------------------------------------------------------ Indications: V728.1 Pre-op evaluation. Aortic stenosis 424.1.  ------------------------------------------------------------ History: PMH: Aortic valve disease. Risk factors: Dementia. Former tobacco use. Hypertension.  ------------------------------------------------------------ Study Conclusions  - Left ventricle: The cavity size was normal. There was moderate concentric hypertrophy. Systolic function was normal. The estimated ejection fraction was in the range of 55% to 60%. Incoordinate distal septal motion. Doppler parameters are consistent with abnormal left ventricular relaxation (grade 1 diastolic dysfunction). The E/e' ratio is >15, suggesting elevated LV filling pressure. - Aortic valve: Heavily calcified leaflets. Peak and mean gradients of 59 and 38 mmHg, respectively. Based on an LVOT diameter of 1.7 cm, the calculated AVA is 0.5 cm2, consistent with severe aortic stenosis. Trivial regurgitation. Valve area:  0.47cm^2(VTI). Valve area: 0.5cm^2 (Vmax). - Mitral valve: Heavy MAC. Mild regurgitation. - Left atrium: Severely dilated (>45 ml/m2). - Right atrium: The atrium was mildly dilated. Transthoracic echocardiography. M-mode, complete 2D, spectral Doppler, and color Doppler. Height: Height: 185.4cm. Height: 73in. Weight: Weight: 63.6kg. Weight: 140lb. Body mass index: BMI: 18.5kg/m^2. Body surface area: BSA: 1.71m^2. Blood pressure: 151/61. Patient status: Outpatient. Location: Echo laboratory.  ------------------------------------------------------------  ------------------------------------------------------------ Left ventricle: The cavity size was normal. There was moderate concentric hypertrophy. Systolic function was normal. The estimated ejection fraction was in the range of 55% to 60%. Incoordinate distal septal motion. Doppler parameters are consistent with abnormal left ventricular relaxation (grade 1 diastolic dysfunction). The E/e' ratio is >15, suggesting elevated LV filling pressure.  ------------------------------------------------------------  Aortic valve: Heavily calcified leaflets. Peak and mean gradients of 59 and 38 mmHg, respectively. Based on an LVOT diameter of 1.7 cm, the calculated AVA is 0.5 cm2, consistent with severe aortic stenosis. Doppler: Trivial regurgitation. Valve area: 0.47cm^2(VTI). Indexed valve area: 0.26cm^2/m^2 (VTI). Peak velocity ratio of LVOT to aortic valve: 0.22. Valve area: 0.5cm^2 (Vmax). Indexed valve area: 0.28cm^2/m^2 (Vmax). Mean gradient: 40mm Hg (S). Peak gradient: 78mm Hg (S).  ------------------------------------------------------------ Aorta: Aortic root: The aortic root was normal in size. Ascending aorta: The ascending aorta was normal in size.  ------------------------------------------------------------ Mitral valve: Heavy MAC. Doppler: Mild regurgitation. Peak gradient: 38mm Hg  (D).  ------------------------------------------------------------ Left atrium: Severely dilated (>45 ml/m2).  ------------------------------------------------------------ Atrial septum: Poorly visualized.  ------------------------------------------------------------ Right ventricle: The cavity size was normal. Wall thickness was normal. Systolic function was normal.  ------------------------------------------------------------ Pulmonic valve: The valve appears to be grossly normal. Doppler: No significant regurgitation.  ------------------------------------------------------------ Tricuspid valve: Poorly visualized. Doppler: Trivial regurgitation.  ------------------------------------------------------------ Pulmonary artery: The main pulmonary artery was normal-sized.  ------------------------------------------------------------ Right atrium: The atrium was mildly dilated.  ------------------------------------------------------------ Pericardium: There was no pericardial effusion.  ------------------------------------------------------------ Systemic veins: The IVC is not well visualized.  ------------------------------------------------------------  2D measurements Normal Doppler measurements Normal Left ventricle Left ventricle LVID ED, 43.2 mm 43-52 Ea, lat 4.79 cm/s ------ chord, ann, tiss PLAX DP LVID ES, 33.4 mm 23-38 E/Ea, lat 16.2 ------ chord, ann, tiss PLAX DP FS, chord, 23 % >29 Ea, med 7.72 cm/s ------ PLAX ann, tiss LVPW, ED 11.2 mm ------ DP IVS/LVPW 1.33 <1.3 E/Ea, med 10.0 ------ ratio, ED ann, tiss 5 Vol ED, 126 ml ------ DP MOD1 LVOT Vol ES, 54.7 ml ------ Peak vel, 78.3 cm/s ------ MOD1 S EF, MOD1 56.6 % ------ Aortic valve Vol index, 70 ml/m^2 ------ Peak vel, 359 cm/s ------ ED, MOD1 S Vol index, 30 ml/m^2 ------ Mean vel, 290 cm/s ------ ES, MOD1 S Vol ED, 113 ml ------ VTI, S 105 cm ------ MOD2 Mean 38 mm Hg ------ Vol ES, 45.9 ml  ------ gradient, MOD2 S EF, MOD2 59.4 % ------ Peak 52 mm Hg ------ Stroke 67.1 ml ------ gradient, vol, MOD2 S Vol index, 63 ml/m^2 ------ Area, VTI 0.47 cm^2 ------ ED, MOD2 Area index 0.26 cm^2/m ------ Vol index, 26 ml/m^2 ------ (VTI) ^2 ES, MOD2 Peak vel 0.22 ------ Stroke 37.4 ml/m^2 ------ ratio, index, LVOT/AV MOD2 Area, Vmax 0.5 cm^2 ------ Ventricular septum Area index 0.28 cm^2/m ------ IVS, ED 14.9 mm ------ (Vmax) ^2 LVOT Mitral valve Diam, S 17 mm ------ Peak E vel 77.6 cm/s ------ Area 2.27 cm^2 ------ Peak A vel 106 cm/s ------ Aorta Decelerati 387 ms 150-23 Root diam, 35 mm ------ on time 0 ED Peak 2 mm Hg ------ Left atrium gradient, AP dim 49 mm ------ D AP dim 2.73 cm/m^2 <2.2 Peak E/A 0.7 ------ index ratio  ------------------------------------------------------------ Prepared and Electronically Authenticated by  Lyman Bishop 2015-05-07T09:[id number]     PROCEDURE: Selective coronary angiography, right heart catheterization. Right upper extremity venogram.  INDICATIONS: Preoperative evaluation prior to aortic valve replacement  The risks, benefits, and details of the procedure were explained to the patient. The patient verbalized understanding and wanted to proceed. Informed written consent was obtained.  PROCEDURE TECHNIQUE: In the holding area, a right antecubital IV was placed. The usual wire to place the brachial sheath would not traverse into the upper extremity. A BMW wire was advanced through the IV catheter into the central circulation. The brachial sheath was placed after lidocaine  was given subcutaneous. A 5 [place] Swan catheter was advanced but would not cross the right upper extremity venous anatomy. A hand-injection of contrast was done through the distal port of the [place] catheter. This revealed a diffusely narrowed vein. The catheter did eventually cross into the axillary vein and SVC. However, it would not advance into the right atrium. After  several attempts, the catheter was removed.  After Xylocaine anesthesia a 2F slender sheath was placed in the right radial artery with a single anterior needle wall stick. The right groin was prepped and draped in usual sterile fashion. A 7 French sheath was placed into the right femoral vein in similar fashion to above. The Swan catheter was advanced to the pulmonary artery. Hemodynamics were obtained. The resolution was performed. The Swan catheter was removed.  After access to the ascending aorta, IV heparin was given and Right coronary angiography was attempted using a Judkins R4 guide catheter. This catheter would not engage the vessel. Left coronary angiography was done using a Judkins L3.5 guide catheter. A Williams right catheter was used to try and engage the RCA unsuccessfully. Finally, an AL-1 catheter was successful and selectively engaging the right coronary artery. The aortic valve was not crossed due to known severe aortic stenosis. A TR band was used for hemostasis. Manual compression will be used for the right brachial venous sheath and the right femoral vein sheath.  CONTRAST: Total of 85 cc.  COMPLICATIONS: None.  HEMODYNAMICS: Aortic pressure was 121/60; LV pressure was not obtained; LVEDP not obtained. Right atrial pressure six over three, mean right atrial pressure 2 mmHg; RV pressure thirty-one over zero, RVEDP 5 mmHg; pulmonary artery pressure 22/7, mean PA pressure 14 mmHg; pulmonary capillary wedge pressure 14/15, mean pulmonary capillary wedge pressure 12 mmHg. Pulmonary artery saturation 68%. Aortic saturation 96%. Cardiac output by Fick 6.1 L per minute. Cardiac index 3.1.  Cardiac output by thermodilution 6.4 L per minute. Cardiac index 3.2.  ANGIOGRAPHIC DATA: The left main coronary artery is widely patent.  The left anterior descending artery is a large vessel which wraps around the apex. There is mild atherosclerosis in the mid vessel. There is a large diagonal vessel which  originates from the mid LAD. This vessel appears widely patent.  The left circumflex artery is a large vessel. The first obtuse marginal is a medium size vessel and has a 50% proximal stenosis. Second obtuse marginal is a large, branching vessel across the lateral wall. There is no significant obstructive disease in this vessel. The remainder of the circumflex is medium-sized and patent. There is a small third obtuse marginal vessel. At the terminal circumflex, there appears to be very small AV fistula.  The right coronary artery is a large, dominant vessel. In the proximal origin of the vessel, there is a severe, 90% stenosis, with poststenotic dilatation. The posterior descending artery is widely patent. Posterior lateral artery is widely patent. The RCA has an anterior takeoff and is best engaged with the Amplatz guide catheter.  Diffusely narrowed vein in the right upper extremity which was not large enough to allow for a 5 French catheter to cross into the right heart.  LEFT VENTRICULOGRAM: Left ventricular angiogram was not done.  IMPRESSIONS:  1. Normal left main coronary artery. 2. Widely patent left anterior descending artery and its branches. 3. Widely patent left circumflex artery and its branches. 4. 90% stenosis in the proximal right coronary artery. 5. Left ventricular systolic function not assessed.  6. Normal pulmonary artery  pressure. Normal cardiac output.  RECOMMENDATION: He'll followup with the cardiac surgeon regarding aortic valve replacement and single-vessel bypass of the RCA. He'll be watched for several hours here and hydrated. He'll be sent home from short stay later today.        Impression:   He has severe aortic stenosis and single vessel coronary artery disease that he says is asymptomatic although he does have some dizziness with bending over and has some dementia so he may be in some denial. He is in good physical condition and still very active. I think he would  be at increased risk to undergo a major abdominal surgery with severe AS and high grade RCA stenosis and this should be repaired prior to undergoing an elective abdominal surgery. This is a difficult situation because of his dementia and need for colon surgery to try to help his disabling fecal and urinary incontinence. He is very alert and conversant and seems to understand and remember what we have discussed so I think he will do well with surgery. I discussed the operative procedure with the patient and his wife including alternatives, benefits and risks; including but not limited to bleeding, blood transfusion, infection, stroke, myocardial infarction, graft failure, heart block requiring a permanent pacemaker, organ dysfunction, and death.  [name] Galloway understands and agrees to proceed.     Plan:  AVR using a tissue valve and coronary bypass to the RCA.

## 2014-03-13 NOTE — Brief Op Note (Addendum)
03/13/2014  12:32 PM  PATIENT:  [name] Galloway  79 y.o. male  PRE-OPERATIVE DIAGNOSIS:  1. Severe Aortic Stenosis 2. Single vessel CAD  POST-OPERATIVE DIAGNOSIS: 1. Severe Aortic Stenosis 2. Single vessel CAD   PROCEDURE:  INTRAOPERATIVE TRANSESOPHAGEAL ECHOCARDIOGRAM, MEDIAN STERNOTOMY for Coronary Artery Bypass Grafting x 1(SVG to RCA) with EVH from the right thigh and AORTIC VALVE REPLACEMENT (AVR) (using a Pericardial Tissue Valve, size 23 mm)  SURGEON:  Surgeon(s) and Role:    * Gaye Pollack, MD - Primary  PHYSICIAN ASSISTANT: Lars Pinks PA-C  ANESTHESIA:   general  EBL:  Total I/O In: 1000 [I.V.:1000] Out: 1175 [Urine:1175]   DRAINS: Chest tubes placed in the mediastinal and pleural spaces    SPECIMEN:  Source of Specimen:  Native aortic valve leaflets  DISPOSITION OF SPECIMEN:  PATHOLOGY  COUNTS CORRECT:  YES  DICTATION: .Dragon Dictation  PLAN OF CARE: Admit to inpatient   PATIENT DISPOSITION:  ICU - intubated and hemodynamically stable.   Delay start of Pharmacological VTE agent (>24hrs) due to surgical blood loss or risk of bleeding: yes  BASELINE WEIGHT: 71 kg  Aortic Valve Etiology   Aortic Insufficiency:  Trivial/Trace  Aortic Valve Disease:  Yes.  Aortic Stenosis:  Yes. Smallest Aortic Valve Area: 0.5cm2; Highest Mean Gradient: 17mmHg.  Etiology (Choose at least one and up to  5 etiologies):  Degenerative - Calcified  Aortic Valve  Procedure Performed:  Replacement: Yes.  Bioprosthetic Valve. Implant Model Number:[id number] [id number], Size:23 mm, Unique Device Identifier:[id number]  Repair/Reconstruction: No.   Aortic Annular Enlargement: No.

## 2014-03-13 NOTE — Anesthesia Preprocedure Evaluation (Addendum)
Anesthesia Evaluation  Patient identified by MRN, date of birth, ID band Patient awake    Reviewed: Allergy & Precautions, H&P , NPO status , Patient's Chart, lab work & pertinent test results, reviewed documented beta blocker date and time   Airway Mallampati: II TM Distance: >3 FB Neck ROM: full    Dental  (+) Teeth Intact, Dental Advisory Given   Pulmonary neg pulmonary ROS, former smoker,  breath sounds clear to auscultation        Cardiovascular hypertension, On Medications + CAD and + Peripheral Vascular Disease + Valvular Problems/Murmurs AS Rhythm:regular     Neuro/Psych PSYCHIATRIC DISORDERS negative neurological ROS     GI/Hepatic Neg liver ROS, hiatal hernia, Medicated and Controlled,  Endo/Other  negative endocrine ROS  Renal/GU negative Renal ROS  negative genitourinary   Musculoskeletal   Abdominal (+)  Abdomen: soft. Bowel sounds: normal.  Peds  Hematology  (+) anemia ,   Anesthesia Other Findings See surgeon's H&P   Reproductive/Obstetrics negative OB ROS                         Anesthesia Physical Anesthesia Plan  ASA: III  Anesthesia Plan: General   Post-op Pain Management:    Induction: Intravenous  Airway Management Planned: Oral ETT  Additional Equipment: Arterial line, CVP, PA Cath, TEE and Ultrasound Guidance Line Placement  Intra-op Plan:   Post-operative Plan: Post-operative intubation/ventilation  Informed Consent: I have reviewed the patients History and Physical, chart, labs and discussed the procedure including the risks, benefits and alternatives for the proposed anesthesia with the patient or authorized representative who has indicated his/her understanding and acceptance.   Dental Advisory Given  Plan Discussed with: CRNA and Surgeon  Anesthesia Plan Comments:         Anesthesia Quick Evaluation

## 2014-03-13 NOTE — Progress Notes (Signed)
  Echocardiogram Echocardiogram Transesophageal has been performed.  Mauricio Po 03/13/2014, 9:54 AM

## 2014-03-13 NOTE — Interval H&P Note (Signed)
History and Physical Interval Note:  03/13/2014 9:09 AM  [name] Galloway  has presented today for surgery, with the diagnosis of AS CAD  The various methods of treatment have been discussed with the patient and family. After consideration of risks, benefits and other options for treatment, the patient has consented to  Procedure(s): CORONARY ARTERY BYPASS GRAFTING (CABG) (N/A) AORTIC VALVE REPLACEMENT (AVR) (N/A) INTRAOPERATIVE TRANSESOPHAGEAL ECHOCARDIOGRAM (N/A) as a surgical intervention .  The patient's history has been reviewed, patient examined, no change in status, stable for surgery.  I have reviewed the patient's chart and labs.  Questions were answered to the patient's satisfaction.     Gaye Pollack

## 2014-03-13 NOTE — Progress Notes (Signed)
S/p AVR/CABG  Intubated, sedated  BP 130/78  Pulse 90  Temp(Src) 96.8 F (36 C) (Core (Comment))  Resp 12  Ht 6' (1.829 m)  Wt 157 lb (71.215 kg)  BMI 21.29 kg/m2  SpO2 100%  CI= 1.75 PA 28/19 after 4 bottles of albumin   Intake/Output Summary (Last 24 hours) at 03/13/14 1754 Last data filed at 03/13/14 1708  Gross per 24 hour  Intake 3715.59 ml  Output   3180 ml  Net 535.59 ml    CT output slowing down  Overall doing well post AVR/ CABG, but index is a little low- will give albumin + calcium and start a low dose dopamine gtt.

## 2014-03-13 NOTE — Op Note (Signed)
CARDIOVASCULAR SURGERY OPERATIVE NOTE  03/13/2014  Surgeon:  Gaye Pollack, MD  First Assistant: Lars Pinks,  PA-C   Preoperative Diagnosis:  Severe single vessel coronary artery disease                                              Severe aortic stenosis  Postoperative Diagnosis:  Same   Procedure:  1. Median Sternotomy 2. Extracorporeal circulation 3.   Coronary artery bypass grafting x 1   SVG to RCA   4.   Endoscopic vein harvest from the right leg 5.   Aortic valve replacement using a 23 mm Edwards Magna-Ease pericardial valve   Anesthesia:  General Endotracheal   Clinical History/Surgical Indication:  This patient is a very unfortunate gentleman with a history of moderate to severe aortic stenosis in the past by his prior echo in 2013 who has a history of sigmoid volvulus as well as bowel and bladder incontinence. He was scheduled for colon resection but his preop evaluation including an echo showed severe AS with a mean gradient of 38 and a peak of 59 with an AVA of 0.5 cm2. His LVEF was 55-60%. Anesthesia was concerned about putting him under and surgery was postponed. The patient has some dementia which his wife says has been getting worse over the past 6 months. He remains very active and denies and shortness of breath or chest discomfort. He spent all day yesterday digging out stumps from his yard. He does report some dizziness with bending over if it is really hot outside. I saw him in the office on 02/14/2014 and felt that AVR was the best treatment for him. He underwent R and L heart cath which showed a 90% RCA stenosis in the proximal RCA. Right heart pressures and cardiac output were normal. He has severe aortic stenosis and single vessel coronary artery disease that he says is asymptomatic although he does have some dizziness with bending over and has some dementia so  he may be in some denial. He is in good physical condition and still very active. I think he would be at increased risk to undergo a major abdominal surgery with severe AS and high grade RCA stenosis and this should be repaired prior to undergoing an elective abdominal surgery. This is a difficult situation because of his dementia and need for colon surgery to try to help his disabling fecal and urinary incontinence. He is very alert and conversant and seems to understand and remember what we have discussed so I think he will do well with surgery. I discussed the operative procedure with the patient and his wife including alternatives, benefits and risks; including but not limited to bleeding, blood transfusion, infection, stroke, myocardial infarction, graft failure, heart block requiring a permanent pacemaker, organ dysfunction, and death. Oretha Milch understands and agrees to proceed.     Preparation:  The patient was seen in the preoperative holding area and the correct patient, correct operation were confirmed with the patient after reviewing the medical record and catheterization. The consent was signed by me. Preoperative antibiotics were given. A pulmonary arterial line and radial arterial line were placed by the anesthesia team. The patient was taken back to the operating room and positioned supine on the operating room table. After being placed under general endotracheal anesthesia by the anesthesia team a foley catheter was placed. The  neck, chest, abdomen, and both legs were prepped with betadine soap and solution and draped in the usual sterile manner. A surgical time-out was taken and the correct patient and operative procedure were confirmed with the nursing and anesthesia staff.   TEE: Performed by Dr. Fulton Reek  This showed severe aortic stenosis with preserved LV function. Trivial MR.   Cardiopulmonary Bypass:  A median sternotomy was performed. The pericardium was  opened in the midline. Right ventricular function appeared normal. The ascending aorta was of normal size and had no palpable plaque. There were no contraindications to aortic cannulation or cross-clamping. The patient was fully systemically heparinized and the ACT was maintained > 400 sec. The proximal aortic arch was cannulated with a 20 F aortic cannula for arterial inflow. Venous cannulation was performed via the right atrial appendage using a two-staged venous cannula. An antegrade cardioplegia/vent cannula was inserted into the mid-ascending aorta. A retrograde cardioplegia cannula was inserted via the right atrium into the coronary sinus. A left ventricular vent was placed via the right superior pulmonary vein. Aortic occlusion was performed with a single cross-clamp. Systemic cooling to 32 degrees Centigrade and topical cooling of the heart with iced saline were used. Hyperkalemic antegrade cold blood cardioplegia was used to induce diastolic arrest and was then retrograde cold blood cardioplegia was given at about 20 minute intervals throughout the period of arrest to maintain myocardial temperature at or below 10 degrees centigrade. A temperature probe was inserted into the interventricular septum and an insulating pad was placed in the pericardium.   Endoscopic vein harvest:  The right greater saphenous vein was harvested endoscopically through a 2 cm incision medial to the right knee. It was harvested from the upper thigh to below the knee. It was a medium-sized vein of fair quality. The side branches were all ligated with 4-0 silk ties.    Coronary arteries:  The coronary arteries were examined.   LAD:  Mild focal disease  LCX:  Mild focal disease  RCA:  No visible distal disease   Grafts:   1. SVG to RCA:  3.0 mm. It was sewn end to side using 7-0 prolene continuous suture.  The proximal vein graft anastomoses were performed to the mid-ascending aorta using continuous 6-0  prolene suture. Graft markers were placed around the proximal anastomoses.   Aortic Valve Replacement:  A transverse aortotomy was performed 1 cm above the take-off of the right coronary artery. The native valve was tricuspid with calcified leaflets and moderate annular calcification. The ostia of the coronary arteries were in normal position and were not obstructed. The native valve leaflets were excised and the annulus was decalcified with rongeurs. Care was taken to remove all particulate debris. The left ventricle was directly inspected for debris and then irrigated with ice saline solution. The annulus was sized and a size 23 mm [employer] Ease pericardial valve was chosen. The model number was [id number] and the serial number was [id number].  While the valve was being prepared 2-0 Ethibond pledgeted horizontal mattress sutures were placed around the annulus with the pledgets in a sub-annular position. The sutures were placed through the sewing ring and the valve lowered into place. The sutures were tied sequentially. The valve seated nicely and the coronary ostia were not obstructed. The prosthetic valve leaflets moved normally and there was no sub-valvular obstruction. The aortotomy was closed using 4-0 Prolene suture in 2 layers with felt strips to reinforce the closure.  Completion:  The patient was  rewarmed to 37 degrees Centigrade. The clamp was removed from the LIMA pedicle and there was rapid warming of the septum and return of ventricular fibrillation. The crossclamp was removed with a time of 84 minutes. There was spontaneous return of sinus rhythm. The distal and proximal anastomoses were checked for hemostasis. The position of the grafts was satisfactory. Two temporary epicardial pacing wires were placed on the right atrium and two on the right ventricle. The patient was weaned from CPB without difficulty on no inotropes. CPB time was 109 minutes. Cardiac output was 5 LPM. Heparin was fully  reversed with protamine and the aortic and venous cannulas removed. Hemostasis was achieved. Mediastinal and left pleural drainage tubes were placed. The sternum was closed with #6 stainless steel wires. The fascia was closed with continuous # 1 vicryl suture. The subcutaneous tissue was closed with 2-0 vicryl continuous suture. The skin was closed with 3-0 vicryl subcuticular suture. All sponge, needle, and instrument counts were reported correct at the end of the case. Dry sterile dressings were placed over the incisions and around the chest tubes which were connected to pleurevac suction. The patient was then transported to the surgical intensive care unit in critical but stable condition.

## 2014-03-13 NOTE — Addendum Note (Signed)
Addendum created 03/13/14 1502 by Maeola Harman, CRNA   Modules edited: Anesthesia LDA

## 2014-03-14 ENCOUNTER — Inpatient Hospital Stay (HOSPITAL_COMMUNITY): Payer: Medicare HMO

## 2014-03-14 ENCOUNTER — Encounter (HOSPITAL_COMMUNITY): Payer: Self-pay | Admitting: Surgery

## 2014-03-14 LAB — POCT I-STAT, CHEM 8
BUN: 27 mg/dL — AB (ref 6–23)
CHLORIDE: 103 meq/L (ref 96–112)
Calcium, Ion: 1.19 mmol/L (ref 1.13–1.30)
Creatinine, Ser: 1.7 mg/dL — ABNORMAL HIGH (ref 0.50–1.35)
Glucose, Bld: 138 mg/dL — ABNORMAL HIGH (ref 70–99)
HCT: 20 % — ABNORMAL LOW (ref 39.0–52.0)
Hemoglobin: 6.8 g/dL — CL (ref 13.0–17.0)
POTASSIUM: 4.1 meq/L (ref 3.7–5.3)
SODIUM: 139 meq/L (ref 137–147)
TCO2: 21 mmol/L (ref 0–100)

## 2014-03-14 LAB — BASIC METABOLIC PANEL
ANION GAP: 14 (ref 5–15)
BUN: 25 mg/dL — AB (ref 6–23)
CALCIUM: 8 mg/dL — AB (ref 8.4–10.5)
CO2: 19 mEq/L (ref 19–32)
CREATININE: 1.23 mg/dL (ref 0.50–1.35)
Chloride: 107 mEq/L (ref 96–112)
GFR calc Af Amer: 64 mL/min — ABNORMAL LOW (ref >90)
GFR, EST NON AFRICAN AMERICAN: 55 mL/min — AB (ref >90)
Glucose, Bld: 150 mg/dL — ABNORMAL HIGH (ref 70–99)
Potassium: 4.4 mEq/L (ref 3.7–5.3)
Sodium: 140 mEq/L (ref 137–147)

## 2014-03-14 LAB — GLUCOSE, CAPILLARY
GLUCOSE-CAPILLARY: 132 mg/dL — AB (ref 70–99)
GLUCOSE-CAPILLARY: 146 mg/dL — AB (ref 70–99)
GLUCOSE-CAPILLARY: 147 mg/dL — AB (ref 70–99)
Glucose-Capillary: 124 mg/dL — ABNORMAL HIGH (ref 70–99)
Glucose-Capillary: 113 mg/dL — ABNORMAL HIGH (ref 70–99)
Glucose-Capillary: 97 mg/dL (ref 70–99)

## 2014-03-14 LAB — CREATININE, SERUM
Creatinine, Ser: 1.58 mg/dL — ABNORMAL HIGH (ref 0.50–1.35)
GFR calc Af Amer: 47 mL/min — ABNORMAL LOW (ref >90)
GFR, EST NON AFRICAN AMERICAN: 41 mL/min — AB (ref >90)

## 2014-03-14 LAB — CBC
HCT: 21 % — ABNORMAL LOW (ref 39.0–52.0)
HEMATOCRIT: 21.5 % — AB (ref 39.0–52.0)
HEMOGLOBIN: 7.3 g/dL — AB (ref 13.0–17.0)
Hemoglobin: 7.2 g/dL — ABNORMAL LOW (ref 13.0–17.0)
MCH: 29.8 pg (ref 26.0–34.0)
MCH: 29.9 pg (ref 26.0–34.0)
MCHC: 34.3 g/dL (ref 30.0–36.0)
MCHC: 34 g/dL (ref 30.0–36.0)
MCV: 86.8 fL (ref 78.0–100.0)
MCV: 88.1 fL (ref 78.0–100.0)
Platelets: 99 10*3/uL — ABNORMAL LOW (ref 150–400)
Platelets: 76 10*3/uL — ABNORMAL LOW (ref 150–400)
RBC: 2.42 MIL/uL — ABNORMAL LOW (ref 4.22–5.81)
RBC: 2.44 MIL/uL — AB (ref 4.22–5.81)
RDW: 13.8 % (ref 11.5–15.5)
RDW: 13.9 % (ref 11.5–15.5)
WBC: 7.6 10*3/uL (ref 4.0–10.5)
WBC: 5.9 10*3/uL (ref 4.0–10.5)

## 2014-03-14 LAB — POCT I-STAT 3, ART BLOOD GAS (G3+)
Acid-base deficit: 6 mmol/L — ABNORMAL HIGH (ref 0.0–2.0)
BICARBONATE: 19.5 meq/L — AB (ref 20.0–24.0)
O2 Saturation: 99 %
PO2 ART: 156 mmHg — AB (ref 80.0–100.0)
Patient temperature: 98.2
TCO2: 21 mmol/L (ref 0–100)
pCO2 arterial: 37.7 mmHg (ref 35.0–45.0)
pH, Arterial: 7.322 — ABNORMAL LOW (ref 7.350–7.450)

## 2014-03-14 LAB — MAGNESIUM
Magnesium: 2.3 mg/dL (ref 1.5–2.5)
Magnesium: 2 mg/dL (ref 1.5–2.5)

## 2014-03-14 LAB — PREPARE RBC (CROSSMATCH)

## 2014-03-14 MED ORDER — CHLORHEXIDINE GLUCONATE CLOTH 2 % EX PADS
6.0000 | MEDICATED_PAD | Freq: Every day | CUTANEOUS | Status: AC
  Administered 2014-03-15 – 2014-03-19 (×4): 6 via TOPICAL

## 2014-03-14 MED ORDER — FUROSEMIDE 10 MG/ML IJ SOLN
40.0000 mg | Freq: Once | INTRAMUSCULAR | Status: AC
  Administered 2014-03-14: 40 mg via INTRAVENOUS
  Filled 2014-03-14: qty 4

## 2014-03-14 MED ORDER — MUPIROCIN 2 % EX OINT
1.0000 "application " | TOPICAL_OINTMENT | Freq: Two times a day (BID) | CUTANEOUS | Status: AC
  Administered 2014-03-15 – 2014-03-19 (×10): 1 via NASAL
  Filled 2014-03-14: qty 22

## 2014-03-14 MED ORDER — INSULIN ASPART 100 UNIT/ML ~~LOC~~ SOLN
0.0000 [IU] | SUBCUTANEOUS | Status: DC
  Administered 2014-03-14: 2 [IU] via SUBCUTANEOUS

## 2014-03-14 MED ORDER — ENOXAPARIN SODIUM 40 MG/0.4ML ~~LOC~~ SOLN
40.0000 mg | Freq: Every day | SUBCUTANEOUS | Status: DC
  Administered 2014-03-14: 40 mg via SUBCUTANEOUS
  Filled 2014-03-14 (×2): qty 0.4

## 2014-03-14 MED FILL — Mannitol IV Soln 20%: INTRAVENOUS | Qty: 500 | Status: AC

## 2014-03-14 MED FILL — Heparin Sodium (Porcine) Inj 1000 Unit/ML: INTRAMUSCULAR | Qty: 10 | Status: AC

## 2014-03-14 MED FILL — Lidocaine HCl IV Inj 20 MG/ML: INTRAVENOUS | Qty: 5 | Status: AC

## 2014-03-14 MED FILL — Sodium Chloride IV Soln 0.9%: INTRAVENOUS | Qty: 2000 | Status: AC

## 2014-03-14 MED FILL — Heparin Sodium (Porcine) Inj 1000 Unit/ML: INTRAMUSCULAR | Qty: 30 | Status: AC

## 2014-03-14 MED FILL — Electrolyte-R (PH 7.4) Solution: INTRAVENOUS | Qty: 3000 | Status: AC

## 2014-03-14 MED FILL — Potassium Chloride Inj 2 mEq/ML: INTRAVENOUS | Qty: 40 | Status: AC

## 2014-03-14 MED FILL — Sodium Bicarbonate IV Soln 8.4%: INTRAVENOUS | Qty: 50 | Status: AC

## 2014-03-14 MED FILL — Magnesium Sulfate Inj 50%: INTRAMUSCULAR | Qty: 10 | Status: AC

## 2014-03-14 NOTE — Procedures (Signed)
Extubation Procedure Note  Patient Details:   Name: [name] Galloway DOB: Jul 31, 1935 MRN: [id number]   Airway Documentation:     Evaluation  O2 sats: stable throughout Complications: No apparent complications Patient did tolerate procedure well. Bilateral Breath Sounds: Clear Suctioning: Airway Yes  No complications noted. Pt extubated to 02 via [place]. Vital signs stable, and pt able to speak.   [place] Jet[place] 03/14/2014, 5:15 AM

## 2014-03-14 NOTE — Progress Notes (Signed)
1 Day Post-Op Procedure(s) (LRB): Coronary Artery Bypass Grafting times one using right greater saphenous vein via endovein harvest. (N/A) AORTIC VALVE REPLACEMENT (AVR) (N/A) INTRAOPERATIVE TRANSESOPHAGEAL ECHOCARDIOGRAM (N/A) Subjective: No complaints  Objective: Vital signs in last 24 hours: Temp:  [95.9 F (35.5 C)-98.8 F (37.1 C)] 98.3 F (36.8 C) (07/24 0845) Pulse Rate:  [78-94] 91 (07/24 0900) Cardiac Rhythm:  [-] Normal sinus rhythm (07/24 0720) Resp:  [0-27] 0 (07/24 0900) BP: (92-135)/(36-78) 96/49 mmHg (07/24 0900) SpO2:  [99 %-100 %] 100 % (07/24 0900) Arterial Line BP: (88-162)/(43-69) 129/48 mmHg (07/24 0900) FiO2 (%):  [40 %-50 %] 40 % (07/24 0400) Weight:  [76.6 kg (168 lb 14 oz)] 76.6 kg (168 lb 14 oz) (07/24 0500)  Hemodynamic parameters for last 24 hours: PAP: (25-33)/(10-18) 28/10 mmHg CO:  [2.4 L/min-7 L/min] 7 L/min CI:  [1.3 L/min/m2-3.6 L/min/m2] 3.6 L/min/m2  Intake/Output from previous day: 07/23 0701 - 07/24 0700 In: 5514.9 [I.V.:3429.9; Blood:375; NG/GT:60; IV Piggyback:1650] Out: 7096 [Urine:3625; Emesis/NG output:100; Blood:975; Chest Tube:760] Intake/Output this shift: Total I/O In: 109 [I.V.:96.5; Blood:12.5] Out: 250 [Urine:200; Chest Tube:50]  General appearance: alert and cooperative Neurologic: intact Heart: regular rate and rhythm, S1, S2 normal, no murmur, click, rub or gallop Lungs: clear to auscultation bilaterally Extremities: extremities normal, atraumatic, no cyanosis or edema Wound: dressing dry  Lab Results:  Recent Labs  03/13/14 2045 03/13/14 2050 03/14/14 0400  WBC 8.2  --  7.6  HGB 8.0* 6.5* 7.2*  HCT 22.5* 19.0* 21.0*  PLT 100*  --  99*   BMET:  Recent Labs  03/11/14 1414  03/13/14 2050 03/14/14 0400  NA 137  < > 137 140  Galloway 4.2  < > 4.3 4.4  CL 100  < > 114* 107  CO2 18*  --   --  19  GLUCOSE 96  < > 121* 150*  BUN 23  < > 23 25*  CREATININE 1.26  < > 1.20 1.23  CALCIUM 9.0  --   --  8.0*  < > =  values in this interval not displayed.  PT/INR:  Recent Labs  03/13/14 1455  LABPROT 18.3*  INR 1.52*   ABG    Component Value Date/Time   PHART 7.322* 03/14/2014 0432   HCO3 19.5* 03/14/2014 0432   TCO2 21 03/14/2014 0432   ACIDBASEDEF 6.0* 03/14/2014 0432   O2SAT 99.0 03/14/2014 0432   CBG (last 3)   Recent Labs  03/13/14 2322 03/14/14 0356 03/14/14 0829  GLUCAP 139* 124* 146*   CXR: clear  ECG: NSR, LBBB (old)  Assessment/Plan: S/P Procedure(s) (LRB): Coronary Artery Bypass Grafting times one using right greater saphenous vein via endovein harvest. (N/A) AORTIC VALVE REPLACEMENT (AVR) (N/A) INTRAOPERATIVE TRANSESOPHAGEAL ECHOCARDIOGRAM (N/A) Hemodynamically stable. Wean off dopamine and neo Acute blood loss anemia: With Hgb 7.2 and requiring vasopressor will transfuse a unit of PRBC's Mobilize Diuresis Diabetes control d/c tubes/lines Continue foley due to diuresing patient and patient in ICU See progression orders   LOS: 1 day    [name] Galloway 03/14/2014

## 2014-03-14 NOTE — Progress Notes (Addendum)
TCTS BRIEF SICU PROGRESS NOTE  1 Day Post-Op  S/P Procedure(s) (LRB): Coronary Artery Bypass Grafting times one using right greater saphenous vein via endovein harvest. (N/A) AORTIC VALVE REPLACEMENT (AVR) (N/A) INTRAOPERATIVE TRANSESOPHAGEAL ECHOCARDIOGRAM (N/A)   Confused, but denies pain and follows commands NSR w/ marginal BP but stable O2 sats 99-100% UOP adequate Repeat Hgb 6.8 by iSTAT, CBC pending. Creatinine up to 1.7  Plan: Will transfuse a second unit PRBCs  [name] Galloway 03/14/2014 6:02 PM

## 2014-03-14 NOTE — Care Management Note (Addendum)
    Page 1 of 2   03/25/2014     4:03:22 PM CARE MANAGEMENT NOTE 03/25/2014  Patient:  [name] Galloway, [name] Galloway   Account Number:  [id number]  Date Initiated:  03/14/2014  Documentation initiated by:  Elissa Hefty  Subjective/Objective Assessment:   adm w cabg     Action/Plan:   lives w wife, pcp dr Jeneen Rinks kim   Anticipated DC Date:  03/25/2014   Anticipated DC Plan:  [place] referral  Clinical Social Worker      DC Forensic scientist  CM consult      [facility] Choice  HOME HEALTH   Choice offered to / List presented to:  C-3 Spouse        HH arranged  HH-1 RN  [place].   Status of service:  Completed, signed off Medicare Important Message given?  YES (If response is "NO", the following Medicare IM given date fields will be blank) Date Medicare IM given:  03/17/2014 Medicare IM given by:  Keriann Rankin Date Additional Medicare IM given:  03/24/2014 Additional Medicare IM given by:  Ellan Lambert  Discharge Disposition:  [place]  Per UR Regulation:  Reviewed for med. necessity/level of care/duration of stay  If discussed at [place] of Stay Meetings, dates discussed:   03/25/2014    Comments:  03/25/14 Ellan Lambert, RN, BSN [phone] No approval from insurance co. on SNF; wife is agreeable to take pt home with [facility] services.  Notified PA of this; Joshua ordered by PA.  Referral to [facility], per wife's choice.  Start of care 24-48h post dc date.  No DME needs, per wife.  03/21/14 Ellan Lambert, RN, BSN [phone] Met with pt's wife at bedside today; she states she cannot care for pt at home in the condition he is in.  She has health probs herself, and cannot provide the 24hr care he needs.  Alerted CSW who plans to discuss SNF payment/bed offers today.  03/18/14 Ellan Lambert, RN, BSN [phone] Pt with post op delirium and agitation, requiring sitter. CSW spoke with wife today; she  desires for pt to come home at dc, not SNF.  Will cont to follow.  Pt will need follow up [facility] care at dc if home dc expected.

## 2014-03-15 ENCOUNTER — Inpatient Hospital Stay (HOSPITAL_COMMUNITY): Payer: Medicare HMO

## 2014-03-15 ENCOUNTER — Encounter (HOSPITAL_COMMUNITY): Payer: Self-pay | Admitting: *Deleted

## 2014-03-15 LAB — CBC
HCT: 21.2 % — ABNORMAL LOW (ref 39.0–52.0)
HEMOGLOBIN: 7.2 g/dL — AB (ref 13.0–17.0)
MCH: 29.5 pg (ref 26.0–34.0)
MCHC: 34 g/dL (ref 30.0–36.0)
MCV: 86.9 fL (ref 78.0–100.0)
Platelets: 59 10*3/uL — ABNORMAL LOW (ref 150–400)
RBC: 2.44 MIL/uL — AB (ref 4.22–5.81)
RDW: 14.1 % (ref 11.5–15.5)
WBC: 5.2 10*3/uL (ref 4.0–10.5)

## 2014-03-15 LAB — BASIC METABOLIC PANEL
Anion gap: 12 (ref 5–15)
BUN: 32 mg/dL — AB (ref 6–23)
CHLORIDE: 100 meq/L (ref 96–112)
CO2: 25 mEq/L (ref 19–32)
CREATININE: 1.48 mg/dL — AB (ref 0.50–1.35)
Calcium: 8 mg/dL — ABNORMAL LOW (ref 8.4–10.5)
GFR calc non Af Amer: 44 mL/min — ABNORMAL LOW (ref >90)
GFR, EST AFRICAN AMERICAN: 51 mL/min — AB (ref >90)
Glucose, Bld: 126 mg/dL — ABNORMAL HIGH (ref 70–99)
Potassium: 4 mEq/L (ref 3.7–5.3)
Sodium: 137 mEq/L (ref 137–147)

## 2014-03-15 LAB — GLUCOSE, CAPILLARY
GLUCOSE-CAPILLARY: 97 mg/dL (ref 70–99)
GLUCOSE-CAPILLARY: 135 mg/dL — AB (ref 70–99)
Glucose-Capillary: 106 mg/dL — ABNORMAL HIGH (ref 70–99)
Glucose-Capillary: 107 mg/dL — ABNORMAL HIGH (ref 70–99)
Glucose-Capillary: 122 mg/dL — ABNORMAL HIGH (ref 70–99)
Glucose-Capillary: 98 mg/dL (ref 70–99)

## 2014-03-15 MED ORDER — MOVING RIGHT ALONG BOOK
Freq: Once | Status: AC
  Administered 2014-03-15: 10:00:00
  Filled 2014-03-15: qty 1

## 2014-03-15 MED ORDER — TRAMADOL HCL 50 MG PO TABS
50.0000 mg | ORAL_TABLET | ORAL | Status: DC | PRN
  Administered 2014-03-15 (×3): 100 mg via ORAL
  Administered 2014-03-17: 50 mg via ORAL
  Filled 2014-03-15: qty 1
  Filled 2014-03-15 (×3): qty 2

## 2014-03-15 MED ORDER — SODIUM CHLORIDE 0.9 % IJ SOLN
3.0000 mL | Freq: Two times a day (BID) | INTRAMUSCULAR | Status: DC
  Administered 2014-03-15 – 2014-03-24 (×18): 3 mL via INTRAVENOUS

## 2014-03-15 MED ORDER — SODIUM CHLORIDE 0.9 % IJ SOLN: 3.0000 mL | INTRAMUSCULAR | Status: DC | PRN

## 2014-03-15 MED ORDER — FUROSEMIDE 10 MG/ML IJ SOLN
20.0000 mg | Freq: Four times a day (QID) | INTRAMUSCULAR | Status: AC
  Administered 2014-03-15 (×3): 20 mg via INTRAVENOUS
  Filled 2014-03-15 (×3): qty 2

## 2014-03-15 MED ORDER — POTASSIUM CHLORIDE CRYS ER 20 MEQ PO TBCR
20.0000 meq | EXTENDED_RELEASE_TABLET | Freq: Every day | ORAL | Status: DC
  Administered 2014-03-15 – 2014-03-16 (×2): 20 meq via ORAL
  Filled 2014-03-15 (×3): qty 1

## 2014-03-15 MED ORDER — SODIUM CHLORIDE 0.9 % IV SOLN: 250.0000 mL | INTRAVENOUS | Status: DC | PRN

## 2014-03-15 MED ORDER — FUROSEMIDE 40 MG PO TABS
40.0000 mg | ORAL_TABLET | Freq: Every day | ORAL | Status: DC
Start: 2014-03-16 — End: 2014-03-17
  Administered 2014-03-16: 40 mg via ORAL
  Filled 2014-03-15 (×2): qty 1

## 2014-03-15 NOTE — Progress Notes (Signed)
TCTS BRIEF SICU PROGRESS NOTE  2 Days Post-Op  S/P Procedure(s) (LRB): Coronary Artery Bypass Grafting times one using right greater saphenous vein via endovein harvest. (N/A) AORTIC VALVE REPLACEMENT (AVR) (N/A) INTRAOPERATIVE TRANSESOPHAGEAL ECHOCARDIOGRAM (N/A)   Stable day Reportedly less confused  Plan: Awaiting bed on step down unit.  Continue current plan  Rexene Alberts 03/15/2014 5:56 PM

## 2014-03-15 NOTE — Progress Notes (Addendum)
      [address]       [place] [address]             [phone]        CARDIOTHORACIC SURGERY PROGRESS NOTE   R2 Days Post-Op Procedure(s) (LRB): Coronary Artery Bypass Grafting times one using right greater saphenous vein via endovein harvest. (N/A) AORTIC VALVE REPLACEMENT (AVR) (N/A) INTRAOPERATIVE TRANSESOPHAGEAL ECHOCARDIOGRAM (N/A)  Subjective: Still confused.  Oriented to name.  Polite and cooperative.  Denies pain, SOB  Objective: Vital signs: BP Readings from Last 1 Encounters:  03/15/14 102/47   Pulse Readings from Last 1 Encounters:  03/15/14 46   Resp Readings from Last 1 Encounters:  03/15/14 18   Temp Readings from Last 1 Encounters:  03/15/14 98.3 F (36.8 C) Oral    Hemodynamics:    Physical Exam:  Rhythm:   Sinus w/ PAC's and PVC's  Breath sounds: clear  Heart sounds:  RRR  Incisions:  Clean and dry  Abdomen:  Soft, non-distended, non-tender  Extremities:  Warm, well-perfused    Intake/Output from previous day: 07/24 0701 - 07/25 0700 In: 2205.8 [P.O.:1100; I.V.:307.8; Blood:698; IV Piggyback:100] Out: 1970 [JIRCV:8938; Chest Tube:50] Intake/Output this shift: Total I/O In: 240 [P.O.:240] Out: -   Lab Results:  CBC: Recent Labs  03/14/14 1744 03/14/14 1747 03/15/14 0500  WBC 5.9  --  5.2  HGB 7.3* 6.8* 7.2*  HCT 21.5* 20.0* 21.2*  PLT 76*  --  59*    BMET:  Recent Labs  03/14/14 0400 03/14/14 1744 03/14/14 1747  NA 140  --  139  K 4.4  --  4.1  CL 107  --  103  CO2 19  --   --   GLUCOSE 150*  --  138*  BUN 25*  --  27*  CREATININE 1.23 1.58* 1.70*  CALCIUM 8.0*  --   --      CBG (last 3)   Recent Labs  03/15/14 0011 03/15/14 0406 03/15/14 0738  GLUCAP 107* 106* 97    ABG    Component Value Date/Time   PHART 7.322* 03/14/2014 0432   PCO2ART 37.7 03/14/2014 0432   PO2ART 156.0* 03/14/2014 0432   HCO3 19.5* 03/14/2014 0432   TCO2 21 03/14/2014 1747   ACIDBASEDEF 6.0* 03/14/2014 0432   O2SAT 99.0 03/14/2014 0432    CXR: Clear, mild bibasilar atelectasis L>R  Assessment/Plan: S/P Procedure(s) (LRB): Coronary Artery Bypass Grafting times one using right greater saphenous vein via endovein harvest. (N/A) AORTIC VALVE REPLACEMENT (AVR) (N/A) INTRAOPERATIVE TRANSESOPHAGEAL ECHOCARDIOGRAM (N/A)  Overall stable POD2 Maintaining NSR - BP improved after transfusion Post-op confusion, delirium reportedly w/ some degree of dementia prior to surgery Although the patient is cooperative, he is at high risk for falls Expected post op acute blood loss anemia, no increase in Hgb despite 2 units PRBCs but no sign of ongoing blood loss Post op thrombocytopenia, worse - no clinical signs of HITT Expected post op volume excess, mild Expected post op atelectasis, mild   Will transfuse 1 more unit PRBCs  Check HIT assay and d/c lovenox - watch platelet count  Mobilize  Diuresis  D/C central line and foley  Continue bedside sitter  Avoid narcotic, sedatives  Transfer step down   [name] Galloway 03/15/2014 9:17 AM

## 2014-03-15 NOTE — Plan of Care (Signed)
Problem: Surgery Discharge Goal: Pain controlled with appropriate interventions Outcome: Progressing Some difficulty getting accurate report of pain/relief of pain from patient

## 2014-03-16 ENCOUNTER — Inpatient Hospital Stay (HOSPITAL_COMMUNITY): Payer: Medicare HMO

## 2014-03-16 LAB — BASIC METABOLIC PANEL
Anion gap: 11 (ref 5–15)
BUN: 33 mg/dL — ABNORMAL HIGH (ref 6–23)
CHLORIDE: 96 meq/L (ref 96–112)
CO2: 29 mEq/L (ref 19–32)
CREATININE: 1.65 mg/dL — AB (ref 0.50–1.35)
Calcium: 8.3 mg/dL — ABNORMAL LOW (ref 8.4–10.5)
GFR calc non Af Amer: 38 mL/min — ABNORMAL LOW (ref >90)
GFR, EST AFRICAN AMERICAN: 45 mL/min — AB (ref >90)
Glucose, Bld: 99 mg/dL (ref 70–99)
POTASSIUM: 3.9 meq/L (ref 3.7–5.3)
Sodium: 136 mEq/L — ABNORMAL LOW (ref 137–147)

## 2014-03-16 LAB — CBC
HCT: 27.5 % — ABNORMAL LOW (ref 39.0–52.0)
Hemoglobin: 9.5 g/dL — ABNORMAL LOW (ref 13.0–17.0)
MCH: 30.4 pg (ref 26.0–34.0)
MCHC: 34.5 g/dL (ref 30.0–36.0)
MCV: 88.1 fL (ref 78.0–100.0)
Platelets: 75 10*3/uL — ABNORMAL LOW (ref 150–400)
RBC: 3.12 MIL/uL — ABNORMAL LOW (ref 4.22–5.81)
RDW: 14.3 % (ref 11.5–15.5)
WBC: 5.9 10*3/uL (ref 4.0–10.5)

## 2014-03-16 LAB — GLUCOSE, CAPILLARY
GLUCOSE-CAPILLARY: 99 mg/dL (ref 70–99)
Glucose-Capillary: 87 mg/dL (ref 70–99)

## 2014-03-16 MED ORDER — AMIODARONE HCL 200 MG PO TABS
200.0000 mg | ORAL_TABLET | Freq: Two times a day (BID) | ORAL | Status: DC
  Administered 2014-03-16 – 2014-03-18 (×6): 200 mg via ORAL
  Filled 2014-03-16 (×8): qty 1

## 2014-03-16 MED ORDER — MAGNESIUM HYDROXIDE 400 MG/5ML PO SUSP: 15.0000 mL | Freq: Every day | ORAL | Status: DC | PRN

## 2014-03-16 MED ORDER — AMIODARONE IV BOLUS ONLY 150 MG/100ML
150.0000 mg | Freq: Once | INTRAVENOUS | Status: AC
  Administered 2014-03-16: 150 mg via INTRAVENOUS
  Filled 2014-03-16: qty 100

## 2014-03-16 MED ORDER — LACTULOSE 10 GM/15ML PO SOLN
10.0000 g | Freq: Every day | ORAL | Status: DC | PRN
  Filled 2014-03-16: qty 15

## 2014-03-16 NOTE — Progress Notes (Signed)
Pt ambulated in hallway 500 ft with rolling walker and tolerated activity well. Will continue to monitor.

## 2014-03-16 NOTE — Progress Notes (Signed)
Pt ambulated in hallway 350 ft with rolling walker on room air. Pt tolerated activity well. Will continue to monitor.  

## 2014-03-16 NOTE — Progress Notes (Addendum)
[address]       [place] [address]             [phone]          3 Days Post-Op Procedure(s) (LRB): Coronary Artery Bypass Grafting times one using right greater saphenous vein via endovein harvest. (N/A) AORTIC VALVE REPLACEMENT (AVR) (N/A) INTRAOPERATIVE TRANSESOPHAGEAL ECHOCARDIOGRAM (N/A)  Subjective: Just back from x-ray.  In AF, rate 100-110s, asymptomatic.  Pt oriented to person, place, knows he had "chest surgery".  Tolerating diet, no BM but passing flatus.   Objective: Vital signs in last 24 hours: Patient Vitals for the past 24 hrs:  BP Temp Temp src Pulse Resp SpO2 Weight  03/16/14 0711 102/55 mmHg - - 73 20 96 % -  03/16/14 0336 - - - - - - 160 lb (72.576 kg)  03/16/14 0300 96/50 mmHg 98.2 F (36.8 C) Oral 68 20 94 % -  03/15/14 2145 94/45 mmHg - - 60 - - -  03/15/14 1952 94/49 mmHg 97.6 F (36.4 C) Oral 77 18 93 % -  03/15/14 1800 107/57 mmHg - - 70 18 100 % -  03/15/14 1700 87/48 mmHg - - 60 18 98 % -  03/15/14 1600 83/42 mmHg - - 60 11 99 % -  03/15/14 1538 - 97.8 F (36.6 C) Oral - - - -  03/15/14 1500 95/45 mmHg - - 60 12 95 % -  03/15/14 1400 91/48 mmHg - - 64 17 97 % -  03/15/14 1330 102/63 mmHg - - 76 15 98 % -  03/15/14 1300 92/45 mmHg - - 71 18 95 % -  03/15/14 1215 - - - 110 14 88 % -  03/15/14 1210 - 98.5 F (36.9 C) Oral - - - -  03/15/14 1200 100/47 mmHg - - 66 16 99 % -  03/15/14 1145 - - - 76 17 100 % -  03/15/14 1140 - 98.9 F (37.2 C) Oral - - - -  03/15/14 1130 95/63 mmHg - - 76 15 97 % -  03/15/14 1115 - - - 90 20 99 % -  03/15/14 1100 105/62 mmHg - - 92 17 100 % -  03/15/14 1045 - 98.3 F (36.8 C) Oral 86 18 98 % -  03/15/14 1030 108/49 mmHg 99.7 F (37.6 C) Oral 84 14 99 % -  03/15/14 1026 120/50 mmHg - - 80 - - -  03/15/14 0900 102/47 mmHg - - 46 18 100 % -  03/15/14 0839 - - - 92 18 98 % -  03/15/14 0800 110/53 mmHg - - 80 17 100 % -   Current Weight  03/16/14 160 lb (72.576 kg)  BASELINE  WEIGHT: 71 kg    Intake/Output from previous day: 07/25 0701 - 07/26 0700 In: 935 [P.O.:600; Blood:335] Out: 2550 [Urine:2550]  CBGs 79-02-40-97   PHYSICAL EXAM:  Heart: Irr irr, rates 110s Lungs: Clear Wound: Clean and dry Extremities: No significant LE edema    Lab Results: CBC: Recent Labs  03/15/14 0500 03/16/14 0502  WBC 5.2 5.9  HGB 7.2* 9.5*  HCT 21.2* 27.5*  PLT 59* 75*   BMET:  Recent Labs  03/15/14 1345 03/16/14 0502  NA 137 136*  K 4.0 3.9  CL 100 96  CO2 25 29  GLUCOSE 126* 99  BUN 32* 33*  CREATININE 1.48* 1.65*  CALCIUM 8.0* 8.3*    PT/INR:  Recent Labs  03/13/14 1455  LABPROT 18.3*  INR 1.52*   CXR: FINDINGS:  There is underlying emphysema. There is persistent left lower lobe  consolidation. Right lung is clear. The heart is upper normal in  size with pulmonary vascularity within normal limits. Patient is  status post aortic valve replacement. No adenopathy. No  pneumothorax. Temporary pacemaker wires remain attached to the right  heart.  IMPRESSION:  Underlying emphysema. Left lower lobe consolidation. No  pneumothorax.    Assessment/Plan: S/P Procedure(s) (LRB): Coronary Artery Bypass Grafting times one using right greater saphenous vein via endovein harvest. (N/A) AORTIC VALVE REPLACEMENT (AVR) (N/A) INTRAOPERATIVE TRANSESOPHAGEAL ECHOCARDIOGRAM (N/A)  CV- AF this am, rates low 100s.  SBPs low overnight, 80-100.  Will give bolus Amio, start po Amio, continue low dose Lopressor and watch rate.  Expected postop blood loss anemia- H/H improved following transfusion.  Thrombocytopenia- plts improved, HIT panel pending.  No Lovenox or Heparin.  Vol overload- diurese.  Neuro- postop delirium/confusion with baseline mild delirium, improved this am. Continue to monitor.  GI- LOC today.  Cr mildly elevated today (baseline on admission 1.23).  Continue to watch.  CRPI, pulm toilet.   LOS: 3 days    COLLINS,GINA  H 03/16/2014  I have seen and examined the patient and agree with the assessment and plan as outlined.     Sruthi Maurer H 03/16/2014 11:21 AM

## 2014-03-16 NOTE — Progress Notes (Signed)
Pt has been resting off and on through the night. Pt is still confused, but pleasantly and will follow commands. Sitter at bedside. Bed alarm on. Will continue to assess.

## 2014-03-17 LAB — BASIC METABOLIC PANEL
ANION GAP: 12 (ref 5–15)
BUN: 34 mg/dL — ABNORMAL HIGH (ref 6–23)
CHLORIDE: 95 meq/L — AB (ref 96–112)
CO2: 27 meq/L (ref 19–32)
CREATININE: 1.42 mg/dL — AB (ref 0.50–1.35)
Calcium: 8.3 mg/dL — ABNORMAL LOW (ref 8.4–10.5)
GFR calc Af Amer: 53 mL/min — ABNORMAL LOW (ref >90)
GFR calc non Af Amer: 46 mL/min — ABNORMAL LOW (ref >90)
Glucose, Bld: 114 mg/dL — ABNORMAL HIGH (ref 70–99)
POTASSIUM: 3.3 meq/L — AB (ref 3.7–5.3)
Sodium: 134 mEq/L — ABNORMAL LOW (ref 137–147)

## 2014-03-17 LAB — CBC
HCT: 26.5 % — ABNORMAL LOW (ref 39.0–52.0)
Hemoglobin: 9.2 g/dL — ABNORMAL LOW (ref 13.0–17.0)
MCH: 30.5 pg (ref 26.0–34.0)
MCHC: 34.7 g/dL (ref 30.0–36.0)
MCV: 87.7 fL (ref 78.0–100.0)
PLATELETS: 95 10*3/uL — AB (ref 150–400)
RBC: 3.02 MIL/uL — AB (ref 4.22–5.81)
RDW: 13.9 % (ref 11.5–15.5)
WBC: 7.5 10*3/uL (ref 4.0–10.5)

## 2014-03-17 MED ORDER — VITAMIN B-1 100 MG PO TABS
100.0000 mg | ORAL_TABLET | Freq: Every day | ORAL | Status: DC
  Administered 2014-03-17 – 2014-03-25 (×9): 100 mg via ORAL
  Filled 2014-03-17 (×9): qty 1

## 2014-03-17 MED ORDER — ELDERTONIC PO ELIX
15.0000 mL | ORAL_SOLUTION | Freq: Three times a day (TID) | ORAL | Status: DC
Start: 2014-03-17 — End: 2014-03-24
  Administered 2014-03-17 – 2014-03-21 (×12): 15 mL via ORAL
  Administered 2014-03-22: 5 mL via ORAL
  Administered 2014-03-22 – 2014-03-23 (×3): 15 mL via ORAL
  Filled 2014-03-17 (×25): qty 15

## 2014-03-17 MED ORDER — FUROSEMIDE 20 MG PO TABS
20.0000 mg | ORAL_TABLET | Freq: Every day | ORAL | Status: DC
  Administered 2014-03-17 – 2014-03-23 (×7): 20 mg via ORAL
  Filled 2014-03-17 (×9): qty 1

## 2014-03-17 MED ORDER — POTASSIUM CHLORIDE CRYS ER 20 MEQ PO TBCR: 40.0000 meq | EXTENDED_RELEASE_TABLET | Freq: Every day | ORAL | Status: DC

## 2014-03-17 MED ORDER — FE FUMARATE-B12-VIT C-FA-IFC PO CAPS
1.0000 | ORAL_CAPSULE | Freq: Two times a day (BID) | ORAL | Status: DC
  Filled 2014-03-17 (×2): qty 1

## 2014-03-17 MED ORDER — POTASSIUM CHLORIDE 20 MEQ/15ML (10%) PO LIQD
40.0000 meq | Freq: Every day | ORAL | Status: DC
  Administered 2014-03-17 – 2014-03-19 (×3): 40 meq via ORAL
  Filled 2014-03-17 (×4): qty 30

## 2014-03-17 MED ORDER — POTASSIUM CHLORIDE CRYS ER 20 MEQ PO TBCR
40.0000 meq | EXTENDED_RELEASE_TABLET | Freq: Once | ORAL | Status: AC
  Administered 2014-03-17: 40 meq via ORAL
  Filled 2014-03-17: qty 2

## 2014-03-17 NOTE — Progress Notes (Signed)
OT Cancellation Note  Patient Details Name: [name] Galloway MRN: [id number] DOB: [birth year]   Cancelled Treatment:    Reason Eval/Treat Not Completed: Fatigue/lethargy limiting ability to participate - Pt just back to bed, sleeping soundly, and unable to arouse.  Will reattempt tomorrow.  Darlina Rumpf [place], OTR/L [phone]  03/17/2014, 4:37 PM

## 2014-03-17 NOTE — Progress Notes (Addendum)
      [address]       [place] [address]             [phone]      4 Days Post-Op Procedure(s) (LRB): Coronary Artery Bypass Grafting times one using right greater saphenous vein via endovein harvest. (N/A) AORTIC VALVE REPLACEMENT (AVR) (N/A) INTRAOPERATIVE TRANSESOPHAGEAL ECHOCARDIOGRAM (N/A)  Subjective:  Mr. Faucett has no new complaints this morning.  He developed some agitation overnight, requiring sitter to be placed at bedside.  He attempted to pull at lines as well.  He knows he is in the hospital but is not oriented to person or time.  + BM  Objective: Vital signs in last 24 hours: Temp:  [97.7 F (36.5 C)-98.1 F (36.7 C)] 98.1 F (36.7 C) (07/27 0247) Pulse Rate:  [67-111] 76 (07/27 0247) Cardiac Rhythm:  [-] Normal sinus rhythm (07/27 0730) Resp:  [18-20] 20 (07/27 0247) BP: (89-136)/(51-59) 136/58 mmHg (07/27 0247) SpO2:  [95 %-98 %] 97 % (07/27 0247) Weight:  [158 lb 14.4 oz (72.077 kg)] 158 lb 14.4 oz (72.077 kg) (07/27 0400)  Intake/Output from previous day: 07/26 0701 - 07/27 0700 In: 810 [P.O.:810] Out: 2650 [Urine:2650]  General appearance: alert, cooperative and no distress Heart: irregularly irregular rhythm Lungs: clear to auscultation bilaterally Abdomen: soft, non-tender; bowel sounds normal; no masses,  no organomegaly Extremities: edema none appreciated Wound: clean and dry, ecchymosis of right thigh  Lab Results:  Recent Labs  03/16/14 0502 03/17/14 0500  WBC 5.9 7.5  HGB 9.5* 9.2*  HCT 27.5* 26.5*  PLT 75* 95*   BMET:  Recent Labs  03/16/14 0502 03/17/14 0500  NA 136* 134*  K 3.9 3.3*  CL 96 95*  CO2 29 27  GLUCOSE 99 114*  BUN 33* 34*  CREATININE 1.65* 1.42*  CALCIUM 8.3* 8.3*    PT/INR: No results found for this basename: LABPROT, INR,  in the last 72 hours ABG    Component Value Date/Time   PHART 7.322* 03/14/2014 0432   HCO3 19.5* 03/14/2014 0432   TCO2 21 03/14/2014 1747   ACIDBASEDEF  6.0* 03/14/2014 0432   O2SAT 99.0 03/14/2014 0432   CBG (last 3)   Recent Labs  03/15/14 2107 03/16/14 0007 03/16/14 0321  GLUCAP 98 87 99    Assessment/Plan: S/P Procedure(s) (LRB): Coronary Artery Bypass Grafting times one using right greater saphenous vein via endovein harvest. (N/A) AORTIC VALVE REPLACEMENT (AVR) (N/A) INTRAOPERATIVE TRANSESOPHAGEAL ECHOCARDIOGRAM (N/A)  1. CV- Previous A. Fib, rate in the 60s this morning,- continue Amiodarone and Lopressor 2. Pulm- no acute issues, continue IS 3. Renal- Hypokalemic this morning, will supplement and increase daily dose, weight is at baseline will decrease lasix to 20 mg and taper off, creatinine improving 4. Neuro- + dementia on Aricept, hospital delirium, sitter at bedside 5. Thrombocytopenia- platelets improving 6. Dispo- patient stable, delirium/dementia worsened overnight, monitor heart rate, continue current care    LOS: 4 days    BARRETT, ERIN 03/17/2014  patient examined and medical record reviewed,agree with above note Keep patient in stepdown due to altered mental status plts improved Add po iron for expected post op anemia and po thiamine. Add  benadryl @ HS  As he was awake all night VAN TRIGT III,Charnice Zwilling 03/17/2014

## 2014-03-17 NOTE — Progress Notes (Signed)
CARDIAC REHAB PHASE I   PRE:  Rate/Rhythm: 71 SRBBB  BP:  Supine: 115/46  Sitting:   Standing:    SaO2: 96%RA  MODE:  Ambulation: 500 ft   POST:  Rate/Rhythm: 114 STBBB  BP:  Supine:   Sitting: 152/56  Standing:    SaO2: 97%RA 6701-4103 Pt walked 500 ft with rolling walker and asst x 2. A little difficulty following directions when walking and giving instruction on IS. To recliner after walk. Sitter in room. Can be asst x 1.   Graylon Good, RN BSN  03/17/2014 9:34 AM

## 2014-03-17 NOTE — Evaluation (Signed)
Physical Therapy Evaluation Patient Details Name: [name] Galloway MRN: [id number] DOB: 10/30/1934 Today's Date: 03/17/2014   History of Present Illness  This patient is a very unfortunate gentleman with a history of moderate to severe aortic stenosis in the past by his prior echo in 2013 who has a history of sigmoid volvulus as well as bowel and bladder incontinence. He was scheduled for colon resection but his preop evaluation including an echo showed severe AS .  Anesthesia was concerned about putting him under and surgery was postponed. The patient has some dementia which his wife says has been getting worse over the past 6 months. He remains very active and denies and shortness of breath or chest discomfort. s/p AVR , CABG  Clinical Impression  Pt pleasant but unaware that he had surgery despite multiple periods of education during session. Pt and spouse educated for sternal precautions, implication with mobility, safety and cues for transfers for wife to provide at home. Pt incontinent of stool in chair and assist for pericare with pt unaware and linen change. Pt will benefit from acute therapy trial to maximize mobility and gait adhering to precautions as well as for caregiver education to be able to cue pt to maintain precautions at all times.     Follow Up Recommendations Home health PT;Supervision/Assistance - 24 hour    Equipment Recommendations  Rolling walker with 5" wheels (potential need for RW will further assess)    Recommendations for Other Services       Precautions / Restrictions Precautions Precautions: Fall;Sternal Precaution Comments: incontinent bowel and bladder Restrictions Weight Bearing Restrictions: No      Mobility  Bed Mobility Overal bed mobility: Needs Assistance Bed Mobility: Sit to Sidelying;Rolling Rolling: Min guard       Sit to sidelying: Min assist General bed mobility comments: cues for sequence with assist to maintain  precautions  Transfers Overall transfer level: Needs assistance   Transfers: Sit to/from Stand Sit to Stand: Min assist         General transfer comment: max cues for hand placement, safety and sequence  Ambulation/Gait Ambulation/Gait assistance: Min guard Ambulation Distance (Feet): 250 Feet Assistive device: Rolling walker (2 wheeled) Gait Pattern/deviations: Step-through pattern;Decreased stride length   Gait velocity interpretation: at or above normal speed for age/gender General Gait Details: max cues to step into RW and assist with turns as pt impulsive with partial LOB and tactile cues to recover  Stairs            Wheelchair Mobility    Modified Rankin (Stroke Patients Only)       Balance Overall balance assessment: Needs assistance   Sitting balance-Leahy Scale: Good       Standing balance-Leahy Scale: Fair                               Pertinent Vitals/Pain No pain VSS    Home Living Family/patient expects to be discharged to:: Private residence Living Arrangements: Spouse/significant other Available Help at Discharge: Available 24 hours/day Type of Home: House Home Access: Stairs to enter   [employer] of Steps: 1 Home Layout: One level Home Equipment: None      Prior Function Level of Independence: Independent         Comments: assist for homemaking and cooking but pt was independent with mobility and ADLs     Hand Dominance        Extremity/Trunk Assessment  Upper Extremity Assessment: Overall WFL for tasks assessed           Lower Extremity Assessment: Overall WFL for tasks assessed      Cervical / Trunk Assessment: Normal  Communication   Communication: No difficulties  Cognition Arousal/Alertness: Awake/alert Behavior During Therapy: Flat affect Overall Cognitive Status: History of cognitive impairments - at baseline       Memory: Decreased short-term memory               General Comments      Exercises        Assessment/Plan    PT Assessment Patient needs continued PT services  PT Diagnosis Difficulty walking   PT Problem List Decreased cognition;Decreased activity tolerance;Decreased balance;Decreased knowledge of use of DME;Decreased mobility  PT Treatment Interventions Gait training;Stair training;Functional mobility training;Therapeutic activities;Patient/family education;Cognitive remediation;DME instruction;Balance training   PT Goals (Current goals can be found in the Care Plan section) Acute Rehab PT Goals Patient Stated Goal: go home PT Goal Formulation: With patient/family Time For Goal Achievement: 03/31/14 Potential to Achieve Goals: Fair    Frequency Min 3X/week   Barriers to discharge        Co-evaluation               End of Session   Activity Tolerance: Patient tolerated treatment well Patient left: in bed;with call bell/phone within reach;with family/visitor present;with nursing/sitter in room Nurse Communication: Mobility status;Precautions         Time: 1411-1435 PT Time Calculation (min): 24 min   Charges:   PT Evaluation $Initial PT Evaluation Tier I: 1 Procedure PT Treatments $Therapeutic Activity: 8-22 mins   PT G CodesMelford Aase 03/17/2014, 2:50 PM Elwyn Reach, [place]

## 2014-03-18 LAB — BASIC METABOLIC PANEL
Anion gap: 13 (ref 5–15)
BUN: 34 mg/dL — ABNORMAL HIGH (ref 6–23)
CO2: 25 mEq/L (ref 19–32)
Calcium: 8.6 mg/dL (ref 8.4–10.5)
Chloride: 94 mEq/L — ABNORMAL LOW (ref 96–112)
Creatinine, Ser: 1.38 mg/dL — ABNORMAL HIGH (ref 0.50–1.35)
GFR calc Af Amer: 55 mL/min — ABNORMAL LOW (ref >90)
GFR calc non Af Amer: 48 mL/min — ABNORMAL LOW (ref >90)
Glucose, Bld: 134 mg/dL — ABNORMAL HIGH (ref 70–99)
Potassium: 3.4 mEq/L — ABNORMAL LOW (ref 3.7–5.3)
Sodium: 132 mEq/L — ABNORMAL LOW (ref 137–147)

## 2014-03-18 LAB — TYPE AND SCREEN
ABO/RH(D): A NEG
Antibody Screen: NEGATIVE
UNIT DIVISION: 0
UNIT DIVISION: 0
UNIT DIVISION: 0
Unit division: 0

## 2014-03-18 LAB — CBC
HCT: 25 % — ABNORMAL LOW (ref 39.0–52.0)
Hemoglobin: 8.6 g/dL — ABNORMAL LOW (ref 13.0–17.0)
MCH: 30 pg (ref 26.0–34.0)
MCHC: 34.4 g/dL (ref 30.0–36.0)
MCV: 87.1 fL (ref 78.0–100.0)
Platelets: 121 10*3/uL — ABNORMAL LOW (ref 150–400)
RBC: 2.87 MIL/uL — ABNORMAL LOW (ref 4.22–5.81)
RDW: 13.8 % (ref 11.5–15.5)
WBC: 7.7 10*3/uL (ref 4.0–10.5)

## 2014-03-18 LAB — HEPARIN INDUCED THROMBOCYTOPENIA PNL
Heparin Induced Plt Ab: NEGATIVE
PATIENT O. D.: 0.042
UFH High Dose UFH H: 0 % Release
UFH Low Dose 0.1 IU/mL: 4 % Release
UFH Low Dose 0.5 IU/mL: 0 % Release
UFH SRA RESULT: NEGATIVE

## 2014-03-18 MED ORDER — DOCUSATE SODIUM 50 MG/5ML PO LIQD
200.0000 mg | Freq: Every day | ORAL | Status: DC
  Administered 2014-03-18 – 2014-03-23 (×4): 200 mg via ORAL
  Administered 2014-03-24: 100 mg via ORAL
  Filled 2014-03-18 (×8): qty 20

## 2014-03-18 MED ORDER — PANTOPRAZOLE SODIUM 40 MG PO PACK
40.0000 mg | PACK | Freq: Every day | ORAL | Status: DC
  Administered 2014-03-18 – 2014-03-25 (×7): 40 mg via ORAL
  Filled 2014-03-18 (×8): qty 20

## 2014-03-18 MED ORDER — METOPROLOL TARTRATE 1 MG/ML IV SOLN
2.5000 mg | Freq: Once | INTRAVENOUS | Status: AC
  Administered 2014-03-18: 2.5 mg via INTRAVENOUS

## 2014-03-18 MED ORDER — ASPIRIN 325 MG PO TABS
325.0000 mg | ORAL_TABLET | Freq: Every day | ORAL | Status: DC
  Administered 2014-03-18 – 2014-03-25 (×8): 325 mg via ORAL
  Filled 2014-03-18 (×8): qty 1

## 2014-03-18 NOTE — Evaluation (Signed)
Occupational Therapy Evaluation Patient Details Name: [name] Galloway MRN: [id number] DOB: 1935/07/15 Today's Date: 03/18/2014    History of Present Illness This patient is a very unfortunate gentleman with a history of moderate to severe aortic stenosis in the past by his prior echo in 2013 who has a history of sigmoid volvulus as well as bowel and bladder incontinence. He was scheduled for colon resection but his preop evaluation including an echo showed severe AS .  Anesthesia was concerned about putting him under and surgery was postponed. The patient has some dementia which his wife says has been getting worse over the past 6 months. He remains very active and denies and shortness of breath or chest discomfort. s/p AVR , CABG   Clinical Impression   Pt admitted with above. He demonstrates the below listed deficits and will benefit from continued OT to maximize safety and independence with BADLs.  Pt currently requires min guard assist for BADLs.  His cognition is his largest limiting factor as he is not able to retain any new information.  He will require 24 hour close supervision at discharge.  Spouse was present during eval and states she would like to attempt to take him  Home.  Recommend HHOT and [place] aide      Follow Up Recommendations  Home health OT;Supervision/Assistance - 24 hour (HHaide)    Equipment Recommendations  None recommended by OT    Recommendations for Other Services       Precautions / Restrictions Precautions Precautions: Fall;Sternal Precaution Comments: incontinent bowel and bladder, pt with dementia      Mobility Bed Mobility Overal bed mobility: Needs Assistance Bed Mobility: Sit to Supine;Supine to Sit     Supine to sit: Min guard Sit to supine: Supervision      Transfers Overall transfer level: Needs assistance Equipment used: Rolling walker (2 wheeled) Transfers: Sit to/from [facility] Sit to Stand: Min guard Stand pivot  transfers: Min guard       General transfer comment: Pt tends to push walker away and continue without it.  Requires cues for it's use    Balance Overall balance assessment: Needs assistance Sitting-balance support: Feet supported Sitting balance-Leahy Scale: Good       Standing balance-Leahy Scale: Good                              ADL Overall ADL's : Needs assistance/impaired Eating/Feeding: Independent;Bed level;Sitting   Grooming: Wash/dry hands;Wash/dry face;Oral care;Brushing hair;Min guard;Standing   Upper Body Bathing: Supervision/ safety;Sitting   Lower Body Bathing: Min guard;Sit to/from stand   Upper Body Dressing : Supervision/safety;Sitting   Lower Body Dressing: Min guard;Sit to/from stand   Toilet Transfer: Min guard;Ambulation;Regular Museum/gallery exhibitions officer and Hygiene: Min guard;Sit to/from stand       Functional mobility during ADLs: Min guard;Rolling walker General ADL Comments: Pt unable to recall any sternal precautions.  Unable to retain information or direction provided.  Wife present and states this is his normal      Vision                     Perception     Praxis      Pertinent Vitals/Pain Denies pain      Hand Dominance     Extremity/Trunk Assessment Upper Extremity Assessment Upper Extremity Assessment: Overall WFL for tasks assessed   Lower Extremity Assessment Lower Extremity Assessment: Defer to  PT evaluation   Cervical / Trunk Assessment Cervical / Trunk Assessment: Normal   Communication Communication Communication: No difficulties   Cognition Arousal/Alertness: Awake/alert Behavior During Therapy: WFL for tasks assessed/performed Overall Cognitive Status: History of cognitive impairments - at baseline       Memory: Decreased recall of precautions;Decreased short-term memory             General Comments       Exercises       Shoulder Instructions      Home  Living Family/patient expects to be discharged to:: Private residence Living Arrangements: Spouse/significant other Available Help at Discharge: Available 24 hours/day Type of Home: House Home Access: Stairs to enter [employer] of Steps: 1   Home Layout: One level     Bathroom Shower/Tub: Occupational psychologist: Standard     Home Equipment: None          Prior Functioning/Environment Level of Independence: Needs assistance  Gait / Transfers Assistance Needed: Independent ADL's / Homemaking Assistance Needed: Pt with frequent incontinence - required assist with clean up.  He was able to perform bathing and dressing with distant supervision.  Wife performed cooking and cleaning.  Pt was working at the [employer] until 2/15, but had to quit due to progressive dementia        OT Diagnosis: Generalized weakness;Cognitive deficits   OT Problem List: Decreased strength;Decreased cognition;Decreased safety awareness;Decreased knowledge of use of DME or AE;Decreased knowledge of precautions   OT Treatment/Interventions: Self-care/ADL training;DME and/or AE instruction;Therapeutic activities;Patient/family education;Cognitive remediation/compensation    OT Goals(Current goals can be found in the care plan section) Acute Rehab OT Goals OT Goal Formulation: Patient unable to participate in goal setting Time For Goal Achievement: 03/25/14 Potential to Achieve Goals: Good ADL Goals Pt Will Perform Grooming: with supervision;standing Pt Will Perform Lower Body Bathing: with supervision;sit to/from stand Pt Will Perform Lower Body Dressing: with supervision;sit to/from stand Pt Will Transfer to Toilet: with supervision;ambulating;regular height toilet Pt Will Perform Toileting - Clothing Manipulation and hygiene: with supervision;sit to/from stand Pt Will Perform Tub/Shower Transfer: Shower transfer;with supervision;shower seat;ambulating  OT Frequency:  Min 2X/week   Barriers to D/C:    wife wishes to attempt to take pt home       Co-evaluation              End of Session Equipment Utilized During Treatment: Rolling walker Nurse Communication: Mobility status  Activity Tolerance: Patient tolerated treatment well Patient left: in bed;with call bell/phone within reach;with nursing/sitter in room;with family/visitor present   Time: 3086-5784 OT Time Calculation (min): 33 min Charges:  OT General Charges $OT Visit: 1 Procedure OT Evaluation $Initial OT Evaluation Tier I: 1 Procedure OT Treatments $Self Care/Home Management : 8-22 mins $Therapeutic Activity: 8-22 mins G-Codes:    Florrie Ramires M 2014/04/16, 4:10 PM

## 2014-03-18 NOTE — Progress Notes (Addendum)
[address]       [place] [address]             [phone]      5 Days Post-Op Procedure(s) (LRB): Coronary Artery Bypass Grafting times one using right greater saphenous vein via endovein harvest. (N/A) AORTIC VALVE REPLACEMENT (AVR) (N/A) INTRAOPERATIVE TRANSESOPHAGEAL ECHOCARDIOGRAM (N/A) Subjective: Feels well, very appropriate .   Objective: Vital signs in last 24 hours: Temp:  [97.3 F (36.3 C)-98.2 F (36.8 C)] 98.2 F (36.8 C) (07/28 0450) Pulse Rate:  [63-104] 63 (07/28 0450) Cardiac Rhythm:  [-] Normal sinus rhythm (07/27 2130) Resp:  [18-20] 18 (07/28 0450) BP: (115-129)/(49-63) 123/63 mmHg (07/28 0450) SpO2:  [96 %-98 %] 98 % (07/28 0450) Weight:  [160 lb 11.2 oz (72.893 kg)] 160 lb 11.2 oz (72.893 kg) (07/28 0450)  Hemodynamic parameters for last 24 hours:    Intake/Output from previous day: 07/27 0701 - 07/28 0700 In: 100 [P.O.:100] Out: -  Intake/Output this shift:    General appearance: alert, cooperative and no distress Heart: regular rate and rhythm Lungs: clear to auscultation bilaterally Abdomen: benign Extremities: no edema Wound: incis healing well, + echymosis right thigh  Lab Results:  Recent Labs  03/17/14 0500 03/18/14 0445  WBC 7.5 7.7  HGB 9.2* 8.6*  HCT 26.5* 25.0*  PLT 95* 121*   BMET:  Recent Labs  03/17/14 0500 03/18/14 0445  NA 134* 132*  K 3.3* 3.4*  CL 95* 94*  CO2 27 25  GLUCOSE 114* 134*  BUN 34* 34*  CREATININE 1.42* 1.38*  CALCIUM 8.3* 8.6    PT/INR: No results found for this basename: LABPROT, INR,  in the last 72 hours ABG    Component Value Date/Time   PHART 7.322* 03/14/2014 0432   HCO3 19.5* 03/14/2014 0432   TCO2 21 03/14/2014 1747   ACIDBASEDEF 6.0* 03/14/2014 0432   O2SAT 99.0 03/14/2014 0432   CBG (last 3)   Recent Labs  03/15/14 2107 03/16/14 0007 03/16/14 0321  GLUCAP 98 87 99   Scheduled Meds: . acetaminophen  1,000 mg Oral 4 times per day  . amiodarone   200 mg Oral BID  . aspirin EC  325 mg Oral Daily  . bisacodyl  10 mg Oral Daily   Or  . bisacodyl  10 mg Rectal Daily  . Chlorhexidine Gluconate Cloth  6 each Topical Daily  . docusate sodium  200 mg Oral Daily  . donepezil  5 mg Oral QHS  . furosemide  20 mg Oral Daily  . geriatric multivitamins-minerals  15 mL Oral TID WC  . Memantine HCl ER  1 capsule Oral q morning - 10a  . metoprolol tartrate  12.5 mg Oral BID  . mupirocin ointment  1 application Nasal BID  . pantoprazole  40 mg Oral Daily  . potassium chloride  40 mEq Oral Daily  . sodium chloride  3 mL Intravenous Q12H  . tamsulosin  0.4 mg Oral QPC breakfast  . thiamine  100 mg Oral Daily   Continuous Infusions:  PRN Meds:.sodium chloride, lactulose, magnesium hydroxide, metoprolol, ondansetron (ZOFRAN) IV, sodium chloride, traMADol  No results found.  Assessment/Plan: S/P Procedure(s) (LRB): Coronary Artery Bypass Grafting times one using right greater saphenous vein via endovein harvest. (N/A) AORTIC VALVE REPLACEMENT (AVR) (N/A) INTRAOPERATIVE TRANSESOPHAGEAL ECHOCARDIOGRAM (N/A)  1 stable 2 sinus rhythm/brady with BBB, QTc is in 490-500 range. Will leave beta blocker/amio at current dose and observe 3 H/H stable,  platelets improved 4 creat slightly better, K+ will need continued replacement 5 chronic dementia- stable with delirium improved  LOS: 5 days    GOLD,WAYNE E 03/18/2014  Improved mental status Patient was able to get better sleep with p.m. dose of Benadryl Leave epicardial wires until heart rhythm is more stable patient examined and medical record reviewed,agree with above note. VAN TRIGT III,Oretta Berkland 03/18/2014

## 2014-03-18 NOTE — Progress Notes (Signed)
Clinical Social Work Department BRIEF PSYCHOSOCIAL ASSESSMENT 03/18/2014  Patient:  [name] Galloway, [name] Galloway     Account Number:  [id number]     Admit date:  03/13/2014  Clinical Social Worker:  Megan Salon  Date/Time:  03/18/2014 04:05 PM  Referred by:  Care Management  Date Referred:  03/18/2014 Referred for  SNF Placement   Other Referral:   Interview type:  Family Other interview type:   CSW spoke to patient's wife at bedside    PSYCHOSOCIAL DATA Living Status:  WIFE Admitted from facility:   Level of care:   Primary support name:  Hobert Poplaski Primary support relationship to patient:  SPOUSE Degree of support available:   Good    CURRENT CONCERNS Current Concerns  Post-Acute Placement   Other Concerns:    SOCIAL WORK ASSESSMENT / PLAN Per Case Manager, patient may need SNF placement. CSW went into room and spoke to patient's wife regarding PT recommendation. Patient's wife states she would like to take patient home at discharge and is able to provide 24/7 supervision. Patient's wife states she can manage patient at home and is not interested in SNF at this time. CSW will notify Case Manager who will follow up with patient's wife for dc plans. CSW signing off at this time. Please re consult if social work needs arise.   Assessment/plan status:  Psychosocial Support/Ongoing Assessment of Needs Other assessment/ plan:   Information/referral to community resources:   CSW contact information    PATIENT'S/FAMILY'S RESPONSE TO PLAN OF CARE: Patient's wife states she would like to take patient home at dc.        Jeanette Caprice, MSW, [place]

## 2014-03-18 NOTE — Progress Notes (Signed)
03/18/2014 11:45 AM Nursing note Pt. Noted to be in Afib RVR during ambulation with PT. Rate 130-140. Pt. asssisted back to bed. Rate decreased to 90-110 Afib. Ekg obtained. Bp 148/70. HR 96. Dr. Prescott Gum paged and made aware. Verbal orders received for 2.5mg  Metoprolol IV x 1. Orders enacted. Will continue to closely monitor patient.  Lupita Dawn

## 2014-03-18 NOTE — Progress Notes (Signed)
Physical Therapy Treatment Patient Details Name: [name] Galloway MRN: [id number] DOB: 02/01/1935 Today's Date: 03/18/2014    History of Present Illness This patient is a very unfortunate gentleman with a history of moderate to severe aortic stenosis in the past by his prior echo in 2013 who has a history of sigmoid volvulus as well as bowel and bladder incontinence. He was scheduled for colon resection but his preop evaluation including an echo showed severe AS .  Anesthesia was concerned about putting him under and surgery was postponed. The patient has some dementia which his wife says has been getting worse over the past 6 months. He remains very active and denies and shortness of breath or chest discomfort. s/p AVR , CABG    PT Comments    Pt making slow progress with mobility and continues to require min A for gait with RW as well as max verbal cues to maintain sternal precautions during session.  Pt continues to be confused, unsure of cognitive baseline. Pt oriented to being in hospital, however could not name which one, which city or state we were in, and initially unable to state why he is in the hospital.  Note episode of afib, increased HR w/ RVR during session, therefore assisted back to bed.  RN in room to provide meds and obtain EKG.    Follow Up Recommendations  Home health PT;Supervision/Assistance - 24 hour     Equipment Recommendations  Rolling walker with 5" wheels    Recommendations for Other Services       Precautions / Restrictions Precautions Precautions: Fall;Sternal Precaution Comments: incontinent bowel and bladder, pt with noted confusion during session Restrictions Weight Bearing Restrictions: No    Mobility  Bed Mobility Overal bed mobility: Needs Assistance Bed Mobility: Sit to Supine       Sit to supine: Min assist   General bed mobility comments: Pt requires min A to elevate LEs into bed with max verbal cues to maintain sternal precautions.    Transfers Overall transfer level: Needs assistance Equipment used: Rolling walker (2 wheeled) Transfers: Sit to/from [facility] Sit to Stand: Min assist Stand pivot transfers: Min assist       General transfer comment: Max verbal cues for hand placement to maintain sternal precautions, however pt impulsive at times with mobility and does not follow verbal commands consistently .  Ambulation/Gait Ambulation/Gait assistance: Min assist Ambulation Distance (Feet): 250 Feet Assistive device: Rolling walker (2 wheeled) Gait Pattern/deviations: Step-through pattern;Decreased stride length;Trunk flexed     General Gait Details: Pt requires mod verbal cues for maintaining position inside of RW during session.  Note upon returning to room, pts HR increased to 140's and heart monitor shows RVR.  RN aware and communicated to return pt to bed.     Stairs            Wheelchair Mobility    Modified Rankin (Stroke Patients Only)       Balance                                    Cognition Arousal/Alertness: Awake/alert Behavior During Therapy: Flat affect Overall Cognitive Status: No family/caregiver present to determine baseline cognitive functioning       Memory: Decreased recall of precautions;Decreased short-term memory              Exercises      General Comments  Pertinent Vitals/Pain No stated pain during session.     Home Living                      Prior Function            PT Goals (current goals can now be found in the care plan section) Acute Rehab PT Goals Patient Stated Goal: go home PT Goal Formulation: With patient/family Time For Goal Achievement: 03/31/14 Potential to Achieve Goals: Fair Progress towards PT goals: Progressing toward goals    Frequency  Min 3X/week    PT Plan Current plan remains appropriate    Co-evaluation             End of Session   Activity Tolerance:  Patient tolerated treatment well Patient left: in bed;with call bell/phone within reach;with nursing/sitter in room     Time: 1100-1125 PT Time Calculation (min): 25 min  Charges:  $Gait Training: 8-22 mins $Therapeutic Activity: 8-22 mins                    G Codes:      Denice Bors 03/18/2014, 11:47 AM

## 2014-03-18 NOTE — Progress Notes (Signed)
CARDIAC REHAB PHASE I   PRE:  Rate/Rhythm: 80 afib  BP:  Supine:   Sitting: 98/62  Standing:    SaO2: 98%RA  MODE:  Ambulation: 700 ft   POST:  Rate/Rhythm: 98 afib  BP:  Supine: 131/72  Sitting:   Standing:    SaO2: 98%RA 1418-1445 Pt walked 700 ft on RA with rolling walker and asst x 1. Gait fairly steady. Tolerated well. To bed after walk. Wife and sitter in room.   Graylon Good, RN BSN  03/18/2014 2:42 PM

## 2014-03-18 NOTE — Progress Notes (Signed)
03/18/2014 1240 Dr. Prescott Gum paged and made aware of bladder scan results 468 ml urine in bladder. Pt. States he does not feel the urge to void at this time. Pt. Has been able to void 400 ml of urine during shift. Verbal orders received to I&O cath x1 and send urine for culture. Orders enacted and patient updated on plan of care.  [name] Galloway, Arville Lime

## 2014-03-18 NOTE — Progress Notes (Signed)
03/18/2014 1:15 PM Nursing note During attempt to I&O cath patient, sitter stated that pt. Just voided in condom cath. 450 ml of clear yellow urine confirmed in collection bag. Bladder scan re-performed showing that the bladder had no residual urine. I&O cath was not performed due to pt. Able to spontaneously void and empty bladder. Urine culture sent per orders.  Kaicen Desena, Arville Lime

## 2014-03-19 MED ORDER — ATORVASTATIN CALCIUM 10 MG PO TABS
10.0000 mg | ORAL_TABLET | Freq: Every day | ORAL | Status: DC
Start: 2014-03-19 — End: 2014-03-25
  Administered 2014-03-19 – 2014-03-24 (×6): 10 mg via ORAL
  Filled 2014-03-19 (×7): qty 1

## 2014-03-19 MED ORDER — CEPHALEXIN 500 MG PO CAPS
500.0000 mg | ORAL_CAPSULE | Freq: Three times a day (TID) | ORAL | Status: DC
  Administered 2014-03-19 – 2014-03-25 (×19): 500 mg via ORAL
  Filled 2014-03-19 (×21): qty 1

## 2014-03-19 MED ORDER — ENOXAPARIN SODIUM 40 MG/0.4ML ~~LOC~~ SOLN
40.0000 mg | SUBCUTANEOUS | Status: DC
  Administered 2014-03-19: 40 mg via SUBCUTANEOUS
  Filled 2014-03-19 (×2): qty 0.4

## 2014-03-19 NOTE — Progress Notes (Signed)
CSW spoke to patient's wife at bedside. Patient's wife states she would like to see if insurance approves SNF. CSW will start SNF process to see if patient is approved.  Jeanette Caprice, MSW, [facility]

## 2014-03-19 NOTE — Progress Notes (Addendum)
[address]       [place] [address]             [phone]      6 Days Post-Op Procedure(s) (LRB): Coronary Artery Bypass Grafting times one using right greater saphenous vein via endovein harvest. (N/A) AORTIC VALVE REPLACEMENT (AVR) (N/A) INTRAOPERATIVE TRANSESOPHAGEAL ECHOCARDIOGRAM (N/A) Subjective: Feels ok, making steady progress Had bad night and was up with sitter overnight Had 500 cc bladder residual-straight cath performed and culture sent Objective: Vital signs in last 24 hours: Temp:  [98 F (36.7 C)-98.4 F (36.9 C)] 98 F (36.7 C) (07/29 0430) Pulse Rate:  [69-74] 69 (07/29 0430) Cardiac Rhythm:  [-] Normal sinus rhythm (07/28 2145) Resp:  [18-20] 20 (07/29 0430) BP: (98-116)/(53-64) 116/61 mmHg (07/29 0430) SpO2:  [96 %-100 %] 100 % (07/29 0430) Weight:  [167 lb 5.3 oz (75.9 kg)] 167 lb 5.3 oz (75.9 kg) (07/29 0430)  Hemodynamic parameters for last 24 hours:    Intake/Output from previous day: 07/28 0701 - 07/29 0700 In: 120 [P.O.:120] Out: 1300 [Urine:1300] Intake/Output this shift: Total I/O In: 120 [P.O.:120] Out: -   General appearance: alert, cooperative and no distress Heart: irregularly irregular rhythm Lungs: clear to auscultation bilaterally Abdomen: benign Extremities: no sig edema Wound: incis healing well  Lab Results:  Recent Labs  03/17/14 0500 03/18/14 0445  WBC 7.5 7.7  HGB 9.2* 8.6*  HCT 26.5* 25.0*  PLT 95* 121*   BMET:  Recent Labs  03/17/14 0500 03/18/14 0445  NA 134* 132*  K 3.3* 3.4*  CL 95* 94*  CO2 27 25  GLUCOSE 114* 134*  BUN 34* 34*  CREATININE 1.42* 1.38*  CALCIUM 8.3* 8.6    PT/INR: No results found for this basename: LABPROT, INR,  in the last 72 hours ABG    Component Value Date/Time   PHART 7.322* 03/14/2014 0432   HCO3 19.5* 03/14/2014 0432   TCO2 21 03/14/2014 1747   ACIDBASEDEF 6.0* 03/14/2014 0432   O2SAT 99.0 03/14/2014 0432   CBG (last 3)  No results found for  this basename: GLUCAP,  in the last 72 hours  Scheduled Meds: . aspirin  325 mg Oral Daily  . bisacodyl  10 mg Oral Daily   Or  . bisacodyl  10 mg Rectal Daily  . Chlorhexidine Gluconate Cloth  6 each Topical Daily  . docusate  200 mg Oral Daily  . donepezil  5 mg Oral QHS  . furosemide  20 mg Oral Daily  . geriatric multivitamins-minerals  15 mL Oral TID WC  . Memantine HCl ER  1 capsule Oral q morning - 10a  . metoprolol tartrate  12.5 mg Oral BID  . mupirocin ointment  1 application Nasal BID  . pantoprazole sodium  40 mg Oral Daily  . potassium chloride  40 mEq Oral Daily  . sodium chloride  3 mL Intravenous Q12H  . tamsulosin  0.4 mg Oral QPC breakfast  . thiamine  100 mg Oral Daily   Continuous Infusions:  PRN Meds:.sodium chloride, lactulose, magnesium hydroxide, metoprolol, ondansetron (ZOFRAN) IV, sodium chloride, traMADol  No results found.  Assessment/Plan: S/P Procedure(s) (LRB): Coronary Artery Bypass Grafting times one using right greater saphenous vein via endovein harvest. (N/A) AORTIC VALVE REPLACEMENT (AVR) (N/A) INTRAOPERATIVE TRANSESOPHAGEAL ECHOCARDIOGRAM (N/A)  1 conts with steady progress 2 rhythm- afib/sinus- D/W Dr Darcey Nora who wants to stop the amiodarone. On ASA  3 recheck labs in am  4 hopefully  to SNF soon    LOS: 6 days    [name] Galloway E 03/19/2014  Controlled atrial flutter today-rapid atrial pacing attempted without conversion to sinus Amiodarone does not appear to be helping the patient so we'll discontinue We'll ask cardiology tomorrow about sotalol-Betapace and stopping metoprolol to attempt chemical cardioversion prior to discharge Straight cath urine culture with gram-negative rods-patient allergic to Cipro, will start Keflex Patient's mental status still a problem with underlying dementia. By report his wife strongly wishes to care for him at home and is against SNF  patient examined and medical record reviewed,agree with above  note. [name] [name] Galloway 03/19/2014

## 2014-03-19 NOTE — Progress Notes (Signed)
CARDIAC REHAB PHASE I   PRE:  Rate/Rhythm: 90 afib  BP:  Supine: 110/56  Sitting:   Standing:    SaO2: 95%RA  MODE:  Ambulation: 550 ft   POST:  Rate/Rhythm: 140s afib    99 in bed  BP:  Supine: 140/71  Sitting:  Standing:    SaO2: 96%RA 1002-1030 Pt walked 550 ft on RA with rolling walker. Cut walk short after notification by RN of HR to 140's. Pt denied palpitations. To bed after walk. Heart rate down. Has some difficulty with walker as he gets easily distracted but he is fairly steady. Sitter in room.   Graylon Good, RN BSN  03/19/2014 10:35 AM

## 2014-03-19 NOTE — Progress Notes (Addendum)
Pt in and out of afib aflutter, Dr. Lucianne Lei Tright external paced pt this am. throughout the day pt has been in and out of afib aflutter, asymptomatic, rate controlled. [place], [place], made him aware. monitoring will continue.

## 2014-03-19 NOTE — Progress Notes (Signed)
Pt ambulated 332ft with rolling walker. No s/s of distress no voiced complaints. Monitoring will continue.

## 2014-03-19 NOTE — Discharge Summary (Addendum)
[address]       [place] [address]             [phone]              Discharge Summary  Name: [name] Galloway DOB: 03/21/1935 79 y.o. MRN: [id number]   Admission Date: 03/13/2014 Discharge Date: 03/25/2014    Admitting Diagnosis: Severe single vessel coronary artery disease  Severe aortic stenosis   Discharge Diagnosis:  Severe single vessel coronary artery disease  Severe aortic stenosis Expected postoperative blood loss anemia Postoperative thrombocytopenia Postoperative atrial fibrillation  Past Medical History  Diagnosis Date  . Coronary artery disease   . Dementia   . Psoriasis   . Sigmoid volvulus   . Incontinence of bowel   . Incontinence of urine   . Anemia   . Hypertension     CONTROLLED SINCE WEIGHT LOSS  . H/O hiatal hernia   . Atrial flutter      Procedures: CORONARY ARTERY BYPASS GRAFTING x 1 (Saphenous vein graft to right coronary artery) AORTIC VALVE REPLACEMENT (23 mm Edwards Magna Ease pericardial tissue valve) ENDOSCOPIC VEIN HARVEST RIGHT THIGH - 03/13/2014    HPI:  The patient is a 79 y.o. male with a history of moderate to severe aortic stenosis in the past by his prior echo in 2013, who also has a history of sigmoid volvulus as well as bowel and bladder incontinence. He was scheduled for colon resection but his preop evaluation included an echo which showed severe AS with a mean gradient of 38 and a peak of 59 with an AVA of 0.5 cm2. His LVEF was 55-60%. Anesthesia was concerned about putting him under and surgery was postponed. The patient has some dementia which his wife says has been getting worse over the past 6 months. He remains very active and denies and shortness of breath or chest discomfort. He does report some dizziness with bending over if it is really hot outside. Dr. Cyndia Bent saw him in the office on 02/14/2014 and felt that AVR was the best treatment for him. He underwent R and L heart cath which  showed a 90% RCA stenosis in the proximal RCA. Right heart pressures and cardiac output were normal.  It was felt that he would be at increased risk to undergo a major abdominal surgery with severe AS and high grade RCA stenosis and this should be repaired prior to undergoing an elective abdominal surgery. This is a difficult situation because of his dementia and need for colon surgery to try to help his disabling fecal and urinary incontinence.Dr. Cyndia Bent discussed the operative procedure with the patient and his wife, including alternatives, benefits and risks; including, but not limited to bleeding, blood transfusion, infection, stroke, myocardial infarction, graft failure, heart block requiring a permanent pacemaker, organ dysfunction, and death. Oretha Milch understands and agrees to proceed.    Hospital Course:  The patient was admitted to [facility] on 03/13/2014.  The patient was taken to the operating room and underwent the above procedure.    The postoperative course was notable for expected blood loss anemia which did require transfusion early on. His hemoglobin and hematocrit stabilized after transfusion and he was started on oral iron.  He has had thrombocytopenia and a HIT panel was ordered, which was negative.  Platelets are presently stable and slowly improving.  He also developed atrial fibrillation with rates in the low 100s which converted to sinus rhythm after a bolus of  IV Amiodarone.  He was subsequently started on po Amiodarone and his beta blocker dose was titrated upward. Since his conversion to sinus rhythm, he has had episodes of atrial fibrillation and bradycardia, and his Amiodarone has now been discontinued.  He has had some confusion postoperatively requiring a sitter at bedside, but overall his mental status is at baseline. Creatinine became mildly elevated but is now trending back to baseline with conservative management.   Incisions are healing well.  He is tolerating a  regular diet.  He previously had loose stools and C Dif was negative.He has been working with physical and occupational therapy on reconditioning and ambulation and is progressing well. Unfortunately, his insurance will not provide for him to go to a SNF. He is going to go home with his wife. Home health, PT, and social work are to be arranged.The patient is felt surgically stable for discharge today.   Recent vital signs:  Filed Vitals:   03/25/14 1100  BP: 109/45  Pulse: 73  Temp: 97.9 (36.6)  Resp: 18    Recent laboratory studies:  CBC:No results found for this basename: WBC, HGB, HCT, PLT,  in the last 72 hours BMET: No results found for this basename: NA, K, CL, CO2, GLUCOSE, BUN, CREATININE, CALCIUM,  in the last 72 hours  PT/INR: No results found for this basename: LABPROT, INR,  in the last 72 hours   Discharge Medications:     Medication List    STOP taking these medications       potassium chloride SA 20 MEQ tablet  Commonly known as:  K-DUR,KLOR-CON      TAKE these medications       Enteric coated aspirin 325 MG tablet  Take 1 tablet (325 mg total) by mouth daily.     atorvastatin 10 MG tablet  Commonly known as:  LIPITOR  Take 1 tablet (10 mg total) by mouth daily at 6 PM.     cephALEXin 500 MG capsule  Commonly known as:  KEFLEX  Take 1 capsule (500 mg total) by mouth every 8 (eight) hours. For 2 days then stop.     CoQ10 200 MG Caps  Take 1 capsule by mouth every morning.     digoxin 0.125 MG tablet  Commonly known as:  LANOXIN  Take 1 tablet (0.125 mg total) by mouth daily.     donepezil 5 MG tablet  Commonly known as:  ARICEPT  Take 5 mg by mouth at bedtime.     metoprolol tartrate 25 MG tablet  Commonly known as:  LOPRESSOR  Take 1 tablet (25 mg total) by mouth 2 (two) times daily.     NAMENDA XR 28 MG Cp24  Generic drug:  Memantine HCl ER  Take 1 capsule by mouth every morning.     nystatin 100000 UNIT/ML suspension  Commonly known as:   MYCOSTATIN  Take 5 mLs (500,000 Units total) by mouth 4 (four) times daily. For thrush.     pantoprazole sodium 40 mg/20 mL Pack  Commonly known as:  PROTONIX  Take 20 mLs (40 mg total) by mouth daily. Please stop when discharged from SNF.     tamsulosin 0.4 MG Caps capsule  Commonly known as:  FLOMAX  Take 0.4 mg by mouth daily after breakfast.     traMADol 50 MG tablet  Commonly known as:  ULTRAM  Take 1 tablet (50 mg total) by mouth every 6 (six) hours as needed for severe pain.  VAYACOG 100-19.5-6.5 MG Caps  Generic drug:  Phosphatidylserine-DHA-EPA  Take 1 capsule by mouth every morning.     Vitamin D (Ergocalciferol) 50000 UNITS Caps capsule  Commonly known as:  DRISDOL  Take 50,000 Units by mouth every 7 (seven) days.         Discharge Instructions:  The patient is to refrain from driving, heavy lifting or strenuous activity.  May shower daily and clean incisions with soap and water.  May resume regular diet.   Follow Up: Follow-up Information   Follow up with Richardson Dopp, PA-C On 04/15/2014. (Appointment is with Richardson Dopp, PA-C at 8:30am)    Specialty:  Physician Assistant   Contact information:   [address] [address] [phone]       Follow up with Gaye Pollack, MD On 04/23/2014. (Have a chest x-ray at [facility] at 10:30, then see MD at 11:30)    Specialty:  Cardiothoracic Surgery   Contact information:   [place] [address] [phone]       Follow up with Jani Gravel, MD. (Call for a follow up regarding further surveillance of HGA1C 5.8)    Specialty:  Internal Medicine   Contact information:   [address] [place] [address] [phone]        The patient has been discharged on:  1.Beta Blocker: Yes [ x ]  No [ ]   If No, reason:    2.Ace Inhibitor/ARB: Yes [  ]  No [ x ]  If No, reason: Borderline blood pressures   3.Statin: Yes [ x ]   No [ ]   If No, reason:    4.Ecasa: Yes [ x ]  No [ ]   If No, reason:   Markala Sitts M PA-C 03/25/2014, 2:39 PM

## 2014-03-20 DIAGNOSIS — I2510 Atherosclerotic heart disease of native coronary artery without angina pectoris: Secondary | ICD-10-CM

## 2014-03-20 DIAGNOSIS — I4891 Unspecified atrial fibrillation: Secondary | ICD-10-CM

## 2014-03-20 LAB — CBC
HEMATOCRIT: 30.1 % — AB (ref 39.0–52.0)
HEMOGLOBIN: 10.1 g/dL — AB (ref 13.0–17.0)
MCH: 29.4 pg (ref 26.0–34.0)
MCHC: 33.6 g/dL (ref 30.0–36.0)
MCV: 87.5 fL (ref 78.0–100.0)
Platelets: 205 10*3/uL (ref 150–400)
RBC: 3.44 MIL/uL — ABNORMAL LOW (ref 4.22–5.81)
RDW: 13.8 % (ref 11.5–15.5)
WBC: 9 10*3/uL (ref 4.0–10.5)

## 2014-03-20 LAB — BASIC METABOLIC PANEL
Anion gap: 14 (ref 5–15)
BUN: 35 mg/dL — AB (ref 6–23)
CHLORIDE: 96 meq/L (ref 96–112)
CO2: 26 mEq/L (ref 19–32)
CREATININE: 1.37 mg/dL — AB (ref 0.50–1.35)
Calcium: 7.9 mg/dL — ABNORMAL LOW (ref 8.4–10.5)
GFR calc non Af Amer: 48 mL/min — ABNORMAL LOW (ref >90)
GFR, EST AFRICAN AMERICAN: 56 mL/min — AB (ref >90)
GLUCOSE: 94 mg/dL (ref 70–99)
Potassium: 3.3 mEq/L — ABNORMAL LOW (ref 3.7–5.3)
Sodium: 136 mEq/L — ABNORMAL LOW (ref 137–147)

## 2014-03-20 LAB — URINE CULTURE: Colony Count: 10000

## 2014-03-20 LAB — CLOSTRIDIUM DIFFICILE BY PCR: Toxigenic C. Difficile by PCR: NEGATIVE

## 2014-03-20 MED ORDER — IBUPROFEN 200 MG PO TABS
200.0000 mg | ORAL_TABLET | Freq: Three times a day (TID) | ORAL | Status: AC
  Administered 2014-03-20 – 2014-03-23 (×7): 200 mg via ORAL
  Filled 2014-03-20 (×9): qty 1

## 2014-03-20 MED ORDER — POTASSIUM CHLORIDE CRYS ER 20 MEQ PO TBCR
40.0000 meq | EXTENDED_RELEASE_TABLET | Freq: Every day | ORAL | Status: AC
  Administered 2014-03-20 – 2014-03-21 (×2): 40 meq via ORAL
  Filled 2014-03-20: qty 2

## 2014-03-20 NOTE — Progress Notes (Signed)
Pt heart rate increased afib/aflutter rate hight 90's-120's, bp 100/48. Pt asymptomatic. Informed Dr. Roxan Hockey who was in hall, states ok to give pt prn lopressor 2.5mg  if needed. Monitoring will continue.

## 2014-03-20 NOTE — Progress Notes (Signed)
Physical Therapy Treatment Patient Details Name: [name] Galloway MRN: [id number] DOB: 1934/10/07 Today's Date: 03/20/2014    History of Present Illness This patient is a very unfortunate gentleman with a history of moderate to severe aortic stenosis in the past by his prior echo in 2013 who has a history of sigmoid volvulus as well as bowel and bladder incontinence. He was scheduled for colon resection but his preop evaluation including an echo showed severe AS .  Anesthesia was concerned about putting him under and surgery was postponed. The patient has some dementia which his wife says has been getting worse over the past 6 months. He remains very active and denies and shortness of breath or chest discomfort. s/p AVR , CABG    PT Comments    Pt is doing well with gait with RW.  He did wear mesh brief with padding for gait and at end needed to use [facility] and left with safety sitter and deferred exercises.  Follow Up Recommendations  Home health PT;Supervision/Assistance - 24 hour     Equipment Recommendations  Rolling walker with 5" wheels    Recommendations for Other Services       Precautions / Restrictions Precautions Precautions: Fall;Sternal Precaution Comments: incontinent bowel and bladder, pt with dementia Restrictions Weight Bearing Restrictions: Yes (sternal precautions)    Mobility  Bed Mobility Overal bed mobility: Needs Assistance Bed Mobility: Supine to Sit     Supine to sit: Min assist     General bed mobility comments: Min A at end of transfer to A with trunk and maintain sternal precautions.  Transfers Overall transfer level: Needs assistance Equipment used: Rolling walker (2 wheeled) Transfers: Sit to/from Stand Sit to Stand: Min guard;Supervision (multiple reps) Stand pivot transfers: Min guard          Ambulation/Gait Ambulation/Gait assistance: Min guard Ambulation Distance (Feet): 250 Feet Assistive device: Rolling walker (2 wheeled) Gait  Pattern/deviations: Step-through pattern     General Gait Details: Pt's HR 105 after gait.  Cues for posture and to just use RW lightly for balance.   Stairs            Wheelchair Mobility    Modified Rankin (Stroke Patients Only)       Balance                                    Cognition Arousal/Alertness: Awake/alert Behavior During Therapy: WFL for tasks assessed/performed Overall Cognitive Status: History of cognitive impairments - at baseline       Memory: Decreased recall of precautions;Decreased short-term memory              Exercises      General Comments        Pertinent Vitals/Pain No c/o pain.  HR 103-105 upon returning to room after gait.    Home Living                      Prior Function            PT Goals (current goals can now be found in the care plan section) Progress towards PT goals: Progressing toward goals    Frequency  Min 3X/week    PT Plan Current plan remains appropriate    Co-evaluation             End of Session   Activity Tolerance: Patient tolerated treatment well Patient left:  with nursing/sitter in room (on [facility])     Time: 1123-1140 PT Time Calculation (min): 17 min  Charges:  $Gait Training: 8-22 mins                    G Codes:      Rawleigh Rode LUBECK 03/20/2014, 12:21 PM

## 2014-03-20 NOTE — Progress Notes (Signed)
While pt was ambulatin with PT heart rate increased to 160's. Heart rate decreased after rest. Monitoring will continue.

## 2014-03-20 NOTE — Consult Note (Signed)
CARDIOLOGY CONSULT NOTE   Patient ID: [name] Galloway MRN: [id number], DOB/AGE: 09/24/1934   Admit date: 03/13/2014 Date of Consult: 03/20/2014   Primary Physician: Jani Gravel, MD Primary Cardiologist: Dr. Irish Lack  Pt. Profile  79 year old patient with post-op atrial fibrillation following CABG and aortic valve replacement.  Problem List  Past Medical History  Diagnosis Date  . Coronary artery disease   . Dementia   . Psoriasis   . Sigmoid volvulus   . Incontinence of bowel   . Incontinence of urine   . Anemia   . Hypertension     CONTROLLED SINCE WEIGHT LOSS  . H/O hiatal hernia     Past Surgical History  Procedure Laterality Date  . Colonoscopy  2011    Blockage  . Flexible sigmoidoscopy  09/20/2011    Procedure: FLEXIBLE SIGMOIDOSCOPY;  Surgeon: Jeryl Columbia, MD;  Location: [facility] ENDOSCOPY;  Service: Endoscopy;  Laterality: N/A;  . Hernia repair    . Tonsillectomy    . Cardiac catheterization    . Coronary artery bypass graft N/A 03/13/2014    Procedure: Coronary Artery Bypass Grafting times one using right greater saphenous vein via endovein harvest.;  Surgeon: Gaye Pollack, MD;  Location: [facility] OR;  Service: Open Heart Surgery;  Laterality: N/A;  . Aortic valve replacement N/A 03/13/2014    Procedure: AORTIC VALVE REPLACEMENT (AVR);  Surgeon: Gaye Pollack, MD;  Location: [place];  Service: Open Heart Surgery;  Laterality: N/A;  . Intraoperative transesophageal echocardiogram N/A 03/13/2014    Procedure: INTRAOPERATIVE TRANSESOPHAGEAL ECHOCARDIOGRAM;  Surgeon: Gaye Pollack, MD;  Location: [facility] OR;  Service: Open Heart Surgery;  Laterality: N/A;     Allergies  Allergies  Allergen Reactions  . Ciprofloxacin Hives    HPI   This 79 year old man underwent CABG x 1 and aortic valve replacement with tissue valve on 03/13/14. 4 days ago the patient went into atrial flutter/fibrillation. Patient has dementia and denies any symptoms referable to his heart rhythm. Rate is  controlled on BB. Patient is a fall risk and not felt to be a good candidate for anticoagulation per discussion with Dr. Prescott Gum this am. Patient is having loose stools. Office notes prior to admission note that he is awaiting corrective colon surgery which was postponed to allow for correction of his aortic stenosis.  Inpatient Medications  . aspirin  325 mg Oral Daily  . atorvastatin  10 mg Oral q1800  . bisacodyl  10 mg Oral Daily   Or  . bisacodyl  10 mg Rectal Daily  . cephALEXin  500 mg Oral 3 times per day  . Chlorhexidine Gluconate Cloth  6 each Topical Daily  . docusate  200 mg Oral Daily  . donepezil  5 mg Oral QHS  . enoxaparin (LOVENOX) injection  40 mg Subcutaneous Q24H  . furosemide  20 mg Oral Daily  . geriatric multivitamins-minerals  15 mL Oral TID WC  . Memantine HCl ER  1 capsule Oral q morning - 10a  . metoprolol tartrate  12.5 mg Oral BID  . pantoprazole sodium  40 mg Oral Daily  . potassium chloride  40 mEq Oral Daily  . sodium chloride  3 mL Intravenous Q12H  . tamsulosin  0.4 mg Oral QPC breakfast  . thiamine  100 mg Oral Daily    Family History Family History  Problem Relation Age of Onset  . Cancer Mother   . Hypertension Mother   . Cancer Father   .  Cancer Sister   . Heart attack Son   . Heart disease Son     before age 77     Social History History   Social History  . Marital Status: Married    Spouse Name: N/A    Number of Children: 2  . Years of Education: N/A   Occupational History  . Retired Paediatric nurse   Social History Main Topics  . Smoking status: Former Smoker -- 0.50 packs/day for 10 years    Quit date: 09/20/1971  . Smokeless tobacco: Never Used  . Alcohol Use: No  . Drug Use: No  . Sexual Activity: Not on file   Other Topics Concern  . Not on file   Social History Narrative  . No narrative on file     Review of Systems  General:  No chills, fever, night sweats or weight changes.  Cardiovascular:  No chest pain,  dyspnea on exertion, edema, orthopnea, palpitations, paroxysmal nocturnal dyspnea. Dermatological: No rash, lesions/masses Respiratory: No cough, dyspnea Urologic: No hematuria, dysuria Abdominal:   No nausea, vomiting, diarrhea, bright red blood per rectum, melena, or hematemesis Neurologic:  Significant dementia. All other systems reviewed and are otherwise negative except as noted above.  Physical Exam  Blood pressure 122/61, pulse 78, temperature 99.1 F (37.3 C), temperature source Axillary, resp. rate 18, height 6' (1.829 m), weight 165 lb 1.6 oz (74.889 kg), SpO2 96.00%.  General: Pleasant, NAD Psych: Normal affect. Neuro: Alert. Moves all extremities spontaneously. HEENT: Normal  Neck: Supple without bruits or JVD. Lungs:  Resp regular and unlabored, CTA. Heart: Irregularly irregular rhythm.  Prominent pleuropericardial rub audible across the precordium. Abdomen: Soft, non-tender, non-distended, BS + x 4.  Extremities: No clubbing, cyanosis or edema. DP/PT/Radials 2+ and equal bilaterally.  Labs  No results found for this basename: CKTOTAL, CKMB, TROPONINI,  in the last 72 hours Lab Results  Component Value Date   WBC 9.0 03/20/2014   HGB 10.1* 03/20/2014   HCT 30.1* 03/20/2014   MCV 87.5 03/20/2014   PLT 205 03/20/2014     Recent Labs Lab 03/20/14 0521  NA 136*  K 3.3*  CL 96  CO2 26  BUN 35*  CREATININE 1.37*  CALCIUM 7.9*  GLUCOSE 94   No results found for this basename: CHOL,  HDL,  LDLCALC,  TRIG   No results found for this basename: DDIMER    Radiology/Studies  Dg Chest 2 View  03/16/2014   CLINICAL DATA:  Atelectasis  EXAM: CHEST  2 VIEW  COMPARISON:  March 15, 2014  FINDINGS: There is underlying emphysema. There is persistent left lower lobe consolidation. Right lung is clear. The heart is upper normal in size with pulmonary vascularity within normal limits. Patient is status post aortic valve replacement. No adenopathy. No pneumothorax. Temporary  pacemaker wires remain attached to the right heart.  IMPRESSION: Underlying emphysema. Left lower lobe consolidation. No pneumothorax.   Electronically Signed   By: Lowella Grip M.D.   On: 03/16/2014 07:58   Dg Chest 2 View  03/11/2014   CLINICAL DATA:  Preop for cardiac surgery, former smoking history  EXAM: CHEST  2 VIEW  COMPARISON:  CT chest of 04/29/2013 and portable chest x-ray of 11/01/2008  FINDINGS: No active infiltrate or effusion is seen. Mediastinal and hilar contours are unchanged. The heart is within normal limits in size and calcification of the mitral annulus is noted. No bony abnormality is seen.  IMPRESSION: No active cardiopulmonary disease.   Electronically Signed  By: Ivar Drape M.D.   On: 03/11/2014 15:13   Dg Chest Port 1 View  03/15/2014   CLINICAL DATA:  Postop CABG and aortic valve replacement.  EXAM: PORTABLE CHEST - 1 VIEW  COMPARISON:  Portable chest x-rays yesterday and 03/13/2014. Two-view chest x-ray 03/11/2014.  FINDINGS: Sternotomy for CABG and aortic valve replacement. Cardiac silhouette mildly enlarged but stable. Stable dense left lower lobe atelectasis. Lungs otherwise clear. Pulmonary vascularity normal. No pneumothorax. Interval Swan-Ganz catheter removal. Right jugular introducer sheath tip projects over the upper SVC.  IMPRESSION: Stable dense left lower lobe atelectasis. No new abnormalities. Stable mild cardiomegaly without pulmonary edema.   Electronically Signed   By: Evangeline Dakin M.D.   On: 03/15/2014 09:52   Dg Chest Portable 1 View In Am  03/14/2014   CLINICAL DATA:  79 year old male status post CABG. Initial encounter.  EXAM: PORTABLE CHEST - 1 VIEW  COMPARISON:  03/13/2014 and earlier.  FINDINGS: Intubated. Enteric tube removed. Mediastinal drain and/or left chest tube remain in place. Swan-Ganz catheter from right IJ approach remains in place, tip at the level of the right main pulmonary artery.  Looped wire or catheter projecting at the left  thoracic inlet is new but of unclear significance.  Stable cardiac size and mediastinal contours. Skin fold artifact along the left upper chest. No pneumothorax. No pulmonary edema. Continued hypo ventilation at the left lung base, stable.  IMPRESSION: 1. Extubated and enteric tube removed. 2. Looped wire or catheter projecting at the left thoracic inlet of unclear significance, correlation requested at the bedside. 3.  Otherwise, stable lines and tubes. 4. Stable ventilation. These results will be called to the ordering clinician or representative by the Radiologist Assistant, and communication documented in the PACS or zVision Dashboard.   Electronically Signed   By: Lars Pinks M.D.   On: 03/14/2014 08:03   Dg Chest Portable 1 View  03/13/2014   CLINICAL DATA:  Postop  EXAM: PORTABLE CHEST - 1 VIEW  COMPARISON:  03/11/2014  FINDINGS: Endotracheal tube tip is 5.9 cm from the carina. NG tube tip is in the fundus of the stomach. Right internal jugular vein Swan-Ganz catheter and introducer are in place with its tip in the central right pulmonary artery. Percutaneous pacemaker wires in place. Mediastinal tubes are in place. There is no pneumothorax or pulmonary edema. Minimal volume loss at the left base.  IMPRESSION: Support devices after CABG or appropriately positioned.  No evidence of pneumothorax or CHF.  Mild left basilar atelectasis.   Electronically Signed   By: Maryclare Bean M.D.   On: 03/13/2014 15:07    ECG  Atrial fibrillation Left bundle branch block Since previous tracing 03/14/14 Atrial fibrillation is new Confirmed by GREEN MD,  ASSESSMENT AND PLAN  1. Post-op atrial flutter/fibrillation probably triggered by mild post-op pericarditis. 2. S/P AVR and CABG 3. Hypokalemia 4. Dementia 5. UTI 6. Diarrhea and past history of colonic volvulus which will require future surgery after recovery from heart surgery.  Rec: Discussed with Dr. Prescott Gum. Short course of ibuprofen x 3 days.Not a  candidate for colchicine because of antecedent diarrhea. Continue BB for rate control. Not a warfarin candidate because of dementia and fall risk.  His wife plans to take him home. Likely that rhythm will correct itself with more time post-op.    Signed, Darlin Coco, MD  03/20/2014, 9:53 AM

## 2014-03-20 NOTE — Progress Notes (Addendum)
7 Days Post-Op Procedure(s) (LRB): Coronary Artery Bypass Grafting times one using right greater saphenous vein via endovein harvest. (N/A) AORTIC VALVE REPLACEMENT (AVR) (N/A) INTRAOPERATIVE TRANSESOPHAGEAL ECHOCARDIOGRAM (N/A) Subjective: Feels ok, started with loose stools last night  Objective: Vital signs in last 24 hours: Temp:  [98 F (36.7 C)-99.1 F (37.3 C)] 99.1 F (37.3 C) (07/30 0501) Pulse Rate:  [78-92] 78 (07/30 0501) Cardiac Rhythm:  [-] Atrial fibrillation;Atrial flutter (07/29 1100) Resp:  [18] 18 (07/30 0501) BP: (115-122)/(54-61) 122/61 mmHg (07/30 0501) SpO2:  [96 %-100 %] 96 % (07/30 0501) Weight:  [165 lb 1.6 oz (74.889 kg)] 165 lb 1.6 oz (74.889 kg) (07/30 0501)  Hemodynamic parameters for last 24 hours:    Intake/Output from previous day: 07/29 0701 - 07/30 0700 In: 1200 [P.O.:1200] Out: 1725 [Urine:1725] Intake/Output this shift:    General appearance: alert, cooperative and no distress Heart: regular rate and rhythm Lungs: clear to auscultation bilaterally Abdomen: soft, non-tender , + BS Extremities: mild edema right leg Wound: incis healing well  Lab Results:  Recent Labs  03/18/14 0445 03/20/14 0521  WBC 7.7 9.0  HGB 8.6* 10.1*  HCT 25.0* 30.1*  PLT 121* 205   BMET:  Recent Labs  03/18/14 0445 03/20/14 0521  NA 132* 136*  K 3.4* 3.3*  CL 94* 96  CO2 25 26  GLUCOSE 134* 94  BUN 34* 35*  CREATININE 1.38* 1.37*  CALCIUM 8.6 7.9*    PT/INR: No results found for this basename: LABPROT, INR,  in the last 72 hours ABG    Component Value Date/Time   PHART 7.322* 03/14/2014 0432   HCO3 19.5* 03/14/2014 0432   TCO2 21 03/14/2014 1747   ACIDBASEDEF 6.0* 03/14/2014 0432   O2SAT 99.0 03/14/2014 0432   CBG (last 3)  No results found for this basename: GLUCAP,  in the last 72 hours Scheduled Meds: . aspirin  325 mg Oral Daily  . atorvastatin  10 mg Oral q1800  . bisacodyl  10 mg Oral Daily   Or  . bisacodyl  10 mg Rectal Daily   . cephALEXin  500 mg Oral 3 times per day  . Chlorhexidine Gluconate Cloth  6 each Topical Daily  . docusate  200 mg Oral Daily  . donepezil  5 mg Oral QHS  . enoxaparin (LOVENOX) injection  40 mg Subcutaneous Q24H  . furosemide  20 mg Oral Daily  . geriatric multivitamins-minerals  15 mL Oral TID WC  . Memantine HCl ER  1 capsule Oral q morning - 10a  . metoprolol tartrate  12.5 mg Oral BID  . pantoprazole sodium  40 mg Oral Daily  . potassium chloride  40 mEq Oral Daily  . sodium chloride  3 mL Intravenous Q12H  . tamsulosin  0.4 mg Oral QPC breakfast  . thiamine  100 mg Oral Daily   Continuous Infusions:  PRN Meds:.sodium chloride, lactulose, magnesium hydroxide, metoprolol, ondansetron (ZOFRAN) IV, sodium chloride, traMADol  No results found. Assessment/Plan: S/P Procedure(s) (LRB): Coronary Artery Bypass Grafting times one using right greater saphenous vein via endovein harvest. (N/A) AORTIC VALVE REPLACEMENT (AVR) (N/A) INTRAOPERATIVE TRANSESOPHAGEAL ECHOCARDIOGRAM (N/A)  1 asked cardiol.  to see re aflutter 2 check stool for cdiff 3 additional K+ 4 on keflex for UTI -- Klebsiella 5 sw working with wife re: consideration for SNF vs home  LOS: 7 days    GOLD,WAYNE E 03/20/2014 UTI Klebsiella sensitive to Keflex Rate controlled flutter- nonresponsive to amiodarone, rapid A- pacing Cont metoprolol  Add ibuprofen for friction rub- 3 days  Not felt to be coumadin candidate due to fall risk and dementia-- ASA 325 daily Home with [facility] prob tomorrow DC EPW's  patient examined and medical record reviewed,agree with above note. VAN TRIGT III,Rogene Meth 03/20/2014

## 2014-03-20 NOTE — Progress Notes (Signed)
Removed bil pacing wires. Pt tolerated well. Wires intact. Pt on bedrest for 1hr. Vs q66min. Sitter at bedside, monitoring will continue.

## 2014-03-20 NOTE — Progress Notes (Signed)
Occupational Therapy Treatment Patient Details Name: [name] Galloway MRN: [id number] DOB: 1935-06-06 Today's Date: 03/20/2014    History of present illness This patient is a very unfortunate gentleman with a history of moderate to severe aortic stenosis in the past by his prior echo in 2013 who has a history of sigmoid volvulus as well as bowel and bladder incontinence. He was scheduled for colon resection but his preop evaluation including an echo showed severe AS .  Anesthesia was concerned about putting him under and surgery was postponed. The patient has some dementia which his wife says has been getting worse over the past 6 months. He remains very active and denies and shortness of breath or chest discomfort. s/p AVR , CABG   OT comments  Pt requires supervision to min guard A with BADLs.  Dementia limits independence.  While ambulating on unit HR increased to 164 Afib/flutter.  Pt returned to room with HR decreased to 107 after ~2 min rest.   Follow Up Recommendations  Home health OT;Supervision/Assistance - 24 hour    Equipment Recommendations  None recommended by OT    Recommendations for Other Services      Precautions / Restrictions Precautions Precautions: Fall;Sternal Precaution Comments: incontinent bowel and bladder, pt with dementia Restrictions Weight Bearing Restrictions: Yes (sternal precautions)       Mobility Bed Mobility Overal bed mobility: Needs Assistance Bed Mobility: Supine to Sit     Supine to sit: Min assist     General bed mobility comments: Min A at end of transfer to A with trunk and maintain sternal precautions.  Transfers Overall transfer level: Needs assistance Equipment used: Rolling walker (2 wheeled) Transfers: Sit to/from [facility] Sit to Stand: Supervision Stand pivot transfers: Min guard            Balance Overall balance assessment: Needs assistance   Sitting balance-Leahy Scale: Good       Standing  balance-Leahy Scale: Good                     ADL       Grooming: Wash/dry hands;Wash/dry face;Oral care;Brushing hair;Supervision/safety                   Toilet Transfer: Ambulation;Comfort height toilet;Min guard             General ADL Comments: Pt requries verbal cues for sequencing.  While ambulating on unit HR increased to 164 A-fib/flutter.  Pt returne to room with HR decreased to 107 at end of session RN aware      Vision                     Perception     Praxis      Cognition   Behavior During Therapy: [facility] for tasks assessed/performed Overall Cognitive Status: History of cognitive impairments - at baseline       Memory: Decreased recall of precautions;Decreased short-term memory               Extremity/Trunk Assessment               Exercises     Shoulder Instructions       General Comments      Pertinent Vitals/ Pain       See vitals flow sheet  Home Living  Prior Functioning/Environment              Frequency Min 2X/week     Progress Toward Goals  OT Goals(current goals can now be found in the care plan section)  Progress towards OT goals: Progressing toward goals  ADL Goals Pt Will Perform Grooming: with supervision;standing Pt Will Perform Lower Body Bathing: with supervision;sit to/from stand Pt Will Perform Lower Body Dressing: with supervision;sit to/from stand Pt Will Transfer to Toilet: with supervision;ambulating;regular height toilet Pt Will Perform Toileting - Clothing Manipulation and hygiene: with supervision;sit to/from stand Pt Will Perform Tub/Shower Transfer: Shower transfer;with supervision;shower seat;ambulating  Plan Discharge plan remains appropriate    Co-evaluation                 End of Session Equipment Utilized During Treatment: Rolling walker   Activity Tolerance Treatment limited secondary to medical  complications (Comment) (Afib with RVR)   Patient Left in chair;with call bell/phone within reach;with family/visitor present;with nursing/sitter in room   Nurse Communication Mobility status        Time: 4496-7591 OT Time Calculation (min): 12 min  Charges: OT General Charges $OT Visit: 1 Procedure OT Treatments $Self Care/Home Management : 8-22 mins  Jeroline Wolbert M 03/20/2014, 2:19 PM

## 2014-03-20 NOTE — Progress Notes (Signed)
74 Pt just worked with OT and had PT earlier.Heart rate elevated with OT. We will follow up tomorrow since pt has walked twice. Graylon Good RN BSN 03/20/2014 2:27 PM

## 2014-03-20 NOTE — Progress Notes (Signed)
Pt wife awaiting social worker to call back with SNF approval. Wife wants to know if insurance will cover SNF placement, if not wife will take pt home.

## 2014-03-21 ENCOUNTER — Encounter (HOSPITAL_COMMUNITY): Payer: Self-pay | Admitting: Interventional Cardiology

## 2014-03-21 DIAGNOSIS — I359 Nonrheumatic aortic valve disorder, unspecified: Principal | ICD-10-CM

## 2014-03-21 DIAGNOSIS — I4892 Unspecified atrial flutter: Secondary | ICD-10-CM

## 2014-03-21 MED ORDER — DIGOXIN 125 MCG PO TABS
0.1250 mg | ORAL_TABLET | Freq: Every day | ORAL | Status: DC
  Administered 2014-03-22 – 2014-03-25 (×4): 0.125 mg via ORAL
  Filled 2014-03-21 (×4): qty 1

## 2014-03-21 MED ORDER — DIGOXIN 0.25 MG/ML IJ SOLN
0.2500 mg | Freq: Once | INTRAMUSCULAR | Status: AC
  Administered 2014-03-21: 0.25 mg via INTRAVENOUS
  Filled 2014-03-21: qty 1

## 2014-03-21 MED ORDER — METOPROLOL TARTRATE 25 MG PO TABS
25.0000 mg | ORAL_TABLET | Freq: Two times a day (BID) | ORAL | Status: DC
  Administered 2014-03-21 – 2014-03-25 (×8): 25 mg via ORAL
  Filled 2014-03-21 (×10): qty 1

## 2014-03-21 NOTE — Progress Notes (Signed)
Pt's [facility] bed offers left on pt's bedside table.  Sitter aware.  Call placed to wife.  No answer, message left.

## 2014-03-21 NOTE — Progress Notes (Signed)
Pt up ambulating with NT at this time; no needs voiced; will cont. To monitor.

## 2014-03-21 NOTE — Progress Notes (Signed)
Pt agitated and wanting to go home when wife left; pt and sitter ambulated 550 feet with rolling walker; pt back to room to chair; will cont. To monitor.

## 2014-03-21 NOTE — Progress Notes (Addendum)
[address]       [place] [address]             [phone]   8 Days Post-Op Procedure(s) (LRB): Coronary Artery Bypass Grafting times one using right greater saphenous vein via endovein harvest. (N/A) AORTIC VALVE REPLACEMENT (AVR) (N/A) INTRAOPERATIVE TRANSESOPHAGEAL ECHOCARDIOGRAM (N/A) Subjective: conts to feel well, no more diarrhea , cdiff negative   Objective: Vital signs in last 24 hours: Temp:  [97.3 F (36.3 C)-98.1 F (36.7 C)] 98.1 F (36.7 C) (07/31 0526) Pulse Rate:  [71-113] 87 (07/31 0526) Cardiac Rhythm:  [-] Atrial fibrillation;Atrial flutter (07/30 1940) Resp:  [18-20] 18 (07/31 0526) BP: (88-113)/(43-76) 108/48 mmHg (07/31 0526) SpO2:  [98 %-100 %] 99 % (07/31 0526) Weight:  [153 lb 14.1 oz (69.8 kg)] 153 lb 14.1 oz (69.8 kg) (07/31 0530)  Hemodynamic parameters for last 24 hours:    Intake/Output from previous day: 07/30 0701 - 07/31 0700 In: 850 [P.O.:850] Out: 1600 [Urine:1600] Intake/Output this shift:    General appearance: alert, cooperative and no distress Heart: irregularly irregular rhythm and tachy Lungs: clear to auscultation bilaterally Abdomen: benign Extremities: no edema Wound: incis healing well  Lab Results:  Recent Labs  03/20/14 0521  WBC 9.0  HGB 10.1*  HCT 30.1*  PLT 205   BMET:  Recent Labs  03/20/14 0521  NA 136*  K 3.3*  CL 96  CO2 26  GLUCOSE 94  BUN 35*  CREATININE 1.37*  CALCIUM 7.9*    PT/INR: No results found for this basename: LABPROT, INR,  in the last 72 hours ABG    Component Value Date/Time   PHART 7.322* 03/14/2014 0432   HCO3 19.5* 03/14/2014 0432   TCO2 21 03/14/2014 1747   ACIDBASEDEF 6.0* 03/14/2014 0432   O2SAT 99.0 03/14/2014 0432   CBG (last 3)  No results found for this basename: GLUCAP,  in the last 72 hours  Scheduled Meds: . aspirin  325 mg Oral Daily  . atorvastatin  10 mg Oral q1800  . bisacodyl  10 mg Oral Daily   Or  . bisacodyl  10 mg Rectal  Daily  . cephALEXin  500 mg Oral 3 times per day  . docusate  200 mg Oral Daily  . donepezil  5 mg Oral QHS  . furosemide  20 mg Oral Daily  . geriatric multivitamins-minerals  15 mL Oral TID WC  . ibuprofen  200 mg Oral TID WC  . Memantine HCl ER  1 capsule Oral q morning - 10a  . metoprolol tartrate  12.5 mg Oral BID  . pantoprazole sodium  40 mg Oral Daily  . potassium chloride  40 mEq Oral Daily  . sodium chloride  3 mL Intravenous Q12H  . tamsulosin  0.4 mg Oral QPC breakfast  . thiamine  100 mg Oral Daily   Continuous Infusions:  PRN Meds:.sodium chloride, lactulose, magnesium hydroxide, metoprolol, ondansetron (ZOFRAN) IV, sodium chloride, traMADol    Assessment/Plan: S/P Procedure(s) (LRB): Coronary Artery Bypass Grafting times one using right greater saphenous vein via endovein harvest. (N/A) AORTIC VALVE REPLACEMENT (AVR) (N/A) INTRAOPERATIVE TRANSESOPHAGEAL ECHOCARDIOGRAM (N/A)  1 conts with aflutter, rapid at times( one episode of rate into the 160's)- will increase lopressor to 25 bid 2 otherwise conts to show steady improvement      LOS: 8 days    GOLD,WAYNE E 03/21/2014  Start dig for rate control- HR high during ambulation Patients wife still deciding about SNF-  I have signed FL2 Looks ready for DC

## 2014-03-21 NOTE — Progress Notes (Signed)
Pt ambulated 550 feet with rolling walker; pt back to room to chair; family at bedside; will cont. To monitor.

## 2014-03-21 NOTE — Progress Notes (Signed)
Chest tube sutures d/c at this time per protocol; pt tolerated well; will cont. To monitor.

## 2014-03-21 NOTE — Progress Notes (Signed)
SUBJECTIVE:  No chest pain or palpitations  OBJECTIVE:   Vitals:   Filed Vitals:   03/20/14 1954 03/21/14 0526 03/21/14 0530 03/21/14 0951  BP: 113/62 108/48  102/59  Pulse: 71 87  100  Temp: 97.3 F (36.3 C) 98.1 F (36.7 C)    TempSrc: Oral Oral    Resp: 18 18    Height:      Weight:   153 lb 14.1 oz (69.8 kg)   SpO2: 100% 99%     I&O's:   Intake/Output Summary (Last 24 hours) at 03/21/14 1223 Last data filed at 03/21/14 1003  Gross per 24 hour  Intake    610 ml  Output   1000 ml  Net   -390 ml   TELEMETRY: Reviewed telemetry pt in Atrial flutter, rate controlled:     PHYSICAL EXAM General: Well developed, well nourished, in no acute distress Head:   Normal cephalic and atramatic  Lungs: No wheezing Heart:  Irregularly irregular S1 S2  No JVD.   Abdomen: abdomen soft and non-tender Msk:  Back normal,  Normal strength and tone for age. Extremities:   Tr edema.   Neuro: Alert and oriented. Psych:  Normal affect, responds appropriately   LABS: Basic Metabolic Panel:  Recent Labs  03/20/14 0521  NA 136*  K 3.3*  CL 96  CO2 26  GLUCOSE 94  BUN 35*  CREATININE 1.37*  CALCIUM 7.9*   Liver Function Tests: No results found for this basename: AST, ALT, ALKPHOS, BILITOT, PROT, ALBUMIN,  in the last 72 hours No results found for this basename: LIPASE, AMYLASE,  in the last 72 hours CBC:  Recent Labs  03/20/14 0521  WBC 9.0  HGB 10.1*  HCT 30.1*  MCV 87.5  PLT 205   Cardiac Enzymes: No results found for this basename: CKTOTAL, CKMB, CKMBINDEX, TROPONINI,  in the last 72 hours BNP: No components found with this basename: POCBNP,  D-Dimer: No results found for this basename: DDIMER,  in the last 72 hours Hemoglobin A1C: No results found for this basename: HGBA1C,  in the last 72 hours Fasting Lipid Panel: No results found for this basename: CHOL, HDL, LDLCALC, TRIG, CHOLHDL, LDLDIRECT,  in the last 72 hours Thyroid Function Tests: No results  found for this basename: TSH, T4TOTAL, FREET3, T3FREE, THYROIDAB,  in the last 72 hours Anemia Panel: No results found for this basename: VITAMINB12, FOLATE, FERRITIN, TIBC, IRON, RETICCTPCT,  in the last 72 hours Coag Panel:   Lab Results  Component Value Date   INR 1.52* 03/13/2014   INR 1.07 03/11/2014   INR 1.0 02/20/2014    RADIOLOGY: Dg Chest 2 View  03/16/2014   CLINICAL DATA:  Atelectasis  EXAM: CHEST  2 VIEW  COMPARISON:  March 15, 2014  FINDINGS: There is underlying emphysema. There is persistent left lower lobe consolidation. Right lung is clear. The heart is upper normal in size with pulmonary vascularity within normal limits. Patient is status post aortic valve replacement. No adenopathy. No pneumothorax. Temporary pacemaker wires remain attached to the right heart.  IMPRESSION: Underlying emphysema. Left lower lobe consolidation. No pneumothorax.   Electronically Signed   By: Lowella Grip M.D.   On: 03/16/2014 07:58   Dg Chest 2 View  03/11/2014   CLINICAL DATA:  Preop for cardiac surgery, former smoking history  EXAM: CHEST  2 VIEW  COMPARISON:  CT chest of 04/29/2013 and portable chest x-ray of 11/01/2008  FINDINGS: No active infiltrate or effusion  is seen. Mediastinal and hilar contours are unchanged. The heart is within normal limits in size and calcification of the mitral annulus is noted. No bony abnormality is seen.  IMPRESSION: No active cardiopulmonary disease.   Electronically Signed   By: Ivar Drape M.D.   On: 03/11/2014 15:13   Dg Chest Port 1 View  03/15/2014   CLINICAL DATA:  Postop CABG and aortic valve replacement.  EXAM: PORTABLE CHEST - 1 VIEW  COMPARISON:  Portable chest x-rays yesterday and 03/13/2014. Two-view chest x-ray 03/11/2014.  FINDINGS: Sternotomy for CABG and aortic valve replacement. Cardiac silhouette mildly enlarged but stable. Stable dense left lower lobe atelectasis. Lungs otherwise clear. Pulmonary vascularity normal. No pneumothorax. Interval  Swan-Ganz catheter removal. Right jugular introducer sheath tip projects over the upper SVC.  IMPRESSION: Stable dense left lower lobe atelectasis. No new abnormalities. Stable mild cardiomegaly without pulmonary edema.   Electronically Signed   By: Evangeline Dakin M.D.   On: 03/15/2014 09:52   Dg Chest Portable 1 View In Am  03/14/2014   CLINICAL DATA:  79 year old male status post CABG. Initial encounter.  EXAM: PORTABLE CHEST - 1 VIEW  COMPARISON:  03/13/2014 and earlier.  FINDINGS: Intubated. Enteric tube removed. Mediastinal drain and/or left chest tube remain in place. Swan-Ganz catheter from right IJ approach remains in place, tip at the level of the right main pulmonary artery.  Looped wire or catheter projecting at the left thoracic inlet is new but of unclear significance.  Stable cardiac size and mediastinal contours. Skin fold artifact along the left upper chest. No pneumothorax. No pulmonary edema. Continued hypo ventilation at the left lung base, stable.  IMPRESSION: 1. Extubated and enteric tube removed. 2. Looped wire or catheter projecting at the left thoracic inlet of unclear significance, correlation requested at the bedside. 3.  Otherwise, stable lines and tubes. 4. Stable ventilation. These results will be called to the ordering clinician or representative by the Radiologist Assistant, and communication documented in the PACS or zVision Dashboard.   Electronically Signed   By: Lars Pinks M.D.   On: 03/14/2014 08:03   Dg Chest Portable 1 View  03/13/2014   CLINICAL DATA:  Postop  EXAM: PORTABLE CHEST - 1 VIEW  COMPARISON:  03/11/2014  FINDINGS: Endotracheal tube tip is 5.9 cm from the carina. NG tube tip is in the fundus of the stomach. Right internal jugular vein Swan-Ganz catheter and introducer are in place with its tip in the central right pulmonary artery. Percutaneous pacemaker wires in place. Mediastinal tubes are in place. There is no pneumothorax or pulmonary edema. Minimal volume  loss at the left base.  IMPRESSION: Support devices after CABG or appropriately positioned.  No evidence of pneumothorax or CHF.  Mild left basilar atelectasis.   Electronically Signed   By: Maryclare Bean M.D.   On: 03/13/2014 15:07      ASSESSMENT: Kathyrn Lass:    1) AFlutter: Not a candidate for anticoagulation.  Rate controlled.  Will hopefully resolve on its own.  2) CAD: s/p SVG to RCA.  3) s/p AVR.  Jettie Booze., MD  03/21/2014  12:23 PM

## 2014-03-21 NOTE — Progress Notes (Signed)
CARDIAC REHAB PHASE I   PRE:  Rate/Rhythm: 116 Aflutter  BP:  Supine: 102/59  Sitting:   Standing:    SaO2: 99 RA  MODE:  Ambulation: 550 ft   POST:  Rate/Rhythm: 140's Aflutter  BP:  Supine:   Sitting: 110/64  Standing:    SaO2: 98 RA 0955-1030 Assisted X 1 ans used walker to ambulate. Gait steady with walker. During walk pt's HR int he 140's. He was not symptomatic with HR. Pt walked 550 feet without c/o. Pt to recliner after walk with call light in reach.  Rodney Langton RN 03/21/2014 10:31 AM

## 2014-03-22 DIAGNOSIS — I10 Essential (primary) hypertension: Secondary | ICD-10-CM

## 2014-03-22 MED ORDER — POTASSIUM CHLORIDE CRYS ER 20 MEQ PO TBCR
40.0000 meq | EXTENDED_RELEASE_TABLET | Freq: Once | ORAL | Status: AC
  Administered 2014-03-22: 40 meq via ORAL
  Filled 2014-03-22: qty 2

## 2014-03-22 NOTE — Progress Notes (Signed)
Nursing note Pt getting agitated, ambulated with walker x1 assist 600 feet. Tolerated well will monitor patient. Cortez Steelman, Bettina Gavia RN

## 2014-03-22 NOTE — Progress Notes (Signed)
CARDIAC REHAB PHASE I   PRE:  Rate/Rhythm: 97 afib/flutter  BP:  Sitting: 101/56     SaO2: 95 RA  MODE:  Ambulation: 550 ft   POST:  Rate/Rhythm: 140 afib  BP:  Sitting: 129/60    SaO2: 98 RA  Pt walked with assist x1 and RW.  Pt tolerated walk well and no c/o.  Slight unsteady on feet, but pt stated he felt comfortable.  Returned pt to recliner with RN in room, phone and call button in reach. [phone]  [name] Dallas MS, ACSM RCEP 10:26 AM 03/22/2014

## 2014-03-22 NOTE — Progress Notes (Signed)
Patient ambulated in hallway, 800 feet with nursing staff and walker. Will monitor patient. Stellan Vick, Bettina Gavia RN

## 2014-03-22 NOTE — Progress Notes (Signed)
   SUBJECTIVE:  No chest pain or palpitations  . aspirin  325 mg Oral Daily  . atorvastatin  10 mg Oral q1800  . bisacodyl  10 mg Oral Daily   Or  . bisacodyl  10 mg Rectal Daily  . cephALEXin  500 mg Oral 3 times per day  . digoxin  0.125 mg Oral Daily  . docusate  200 mg Oral Daily  . donepezil  5 mg Oral QHS  . furosemide  20 mg Oral Daily  . geriatric multivitamins-minerals  15 mL Oral TID WC  . ibuprofen  200 mg Oral TID WC  . Memantine HCl ER  1 capsule Oral q morning - 10a  . metoprolol tartrate  25 mg Oral BID  . pantoprazole sodium  40 mg Oral Daily  . sodium chloride  3 mL Intravenous Q12H  . tamsulosin  0.4 mg Oral QPC breakfast  . thiamine  100 mg Oral Daily   OBJECTIVE:   Vitals:   Filed Vitals:   03/21/14 1455 03/21/14 2017 03/21/14 2156 03/22/14 0419  BP: 115/55 95/64 95/51  110/60  Pulse: 87 83  88  Temp:  98.2 F (36.8 C)  97.9 F (36.6 C)  TempSrc:  Oral  Oral  Resp:  18  18  Height:      Weight:    152 lb 12.8 oz (69.31 kg)  SpO2:  98%  98%   I&O's:    Intake/Output Summary (Last 24 hours) at 03/22/14 0941 Last data filed at 03/22/14 0442  Gross per 24 hour  Intake    360 ml  Output   1700 ml  Net  -1340 ml   TELEMETRY: Reviewed telemetry pt in Atrial flutter, rate controlled:  PHYSICAL EXAM General: Well developed, well nourished, in no acute distress Head:   Normal cephalic and atramatic  Lungs: No wheezing Heart:  Irregularly irregular S1 S2  No JVD.   Abdomen: abdomen soft and non-tender Msk:  Back normal,  Normal strength and tone for age. Extremities:   Tr edema.   Neuro: Alert and oriented. Psych:  Normal affect, responds appropriately  LABS: Basic Metabolic Panel:  Recent Labs  03/20/14 0521  NA 136*  K 3.3*  CL 96  CO2 26  GLUCOSE 94  BUN 35*  CREATININE 1.37*  CALCIUM 7.9*    Recent Labs  03/20/14 0521  WBC 9.0  HGB 10.1*  HCT 30.1*  MCV 87.5  PLT 205   Coag Panel:   Lab Results  Component Value Date   INR 1.52* 03/13/2014   INR 1.07 03/11/2014   INR 1.0 02/20/2014     ASSESSMENT: Kathyrn Lass:    1) AFlutter: Not a candidate for anticoagulation.  Rate up multiple times a day, however not much room for titrating BB as BP low. We will follow.  Will hopefully resolve on its own.  2) CAD: s/p SVG to RCA. Continue ASA, statin, BB.  3) s/p AVR.  4. Hypokalemia - replace  Dorothy Spark, MD  03/22/2014  9:41 AM

## 2014-03-22 NOTE — Progress Notes (Addendum)
      [address]       [place] [address]             [phone]      9 Days Post-Op Procedure(s) (LRB): Coronary Artery Bypass Grafting times one using right greater saphenous vein via endovein harvest. (N/A) AORTIC VALVE REPLACEMENT (AVR) (N/A) INTRAOPERATIVE TRANSESOPHAGEAL ECHOCARDIOGRAM (N/A) Subjective: Feels ok, no complaints  Objective: Vital signs in last 24 hours: Temp:  [97.1 F (36.2 C)-98.2 F (36.8 C)] 97.9 F (36.6 C) (08/01 0419) Pulse Rate:  [83-100] 88 (08/01 0419) Cardiac Rhythm:  [-] Atrial fibrillation;Atrial flutter (07/31 2015) Resp:  [18] 18 (08/01 0419) BP: (93-115)/(51-64) 110/60 mmHg (08/01 0419) SpO2:  [98 %-99 %] 98 % (08/01 0419) Weight:  [152 lb 12.8 oz (69.31 kg)] 152 lb 12.8 oz (69.31 kg) (08/01 0419)  Hemodynamic parameters for last 24 hours:    Intake/Output from previous day: 07/31 0701 - 08/01 0700 In: 360 [P.O.:360] Out: 1700 [Urine:1700] Intake/Output this shift:    General appearance: alert, cooperative and no distress Heart: irregularly irregular rhythm Lungs: clear to auscultation bilaterally Abdomen: benign Extremities: no edema Wound: incis healing well  Lab Results:  Recent Labs  03/20/14 0521  WBC 9.0  HGB 10.1*  HCT 30.1*  PLT 205   BMET:  Recent Labs  03/20/14 0521  NA 136*  K 3.3*  CL 96  CO2 26  GLUCOSE 94  BUN 35*  CREATININE 1.37*  CALCIUM 7.9*    PT/INR: No results found for this basename: LABPROT, INR,  in the last 72 hours ABG    Component Value Date/Time   PHART 7.322* 03/14/2014 0432   HCO3 19.5* 03/14/2014 0432   TCO2 21 03/14/2014 1747   ACIDBASEDEF 6.0* 03/14/2014 0432   O2SAT 99.0 03/14/2014 0432   CBG (last 3)  No results found for this basename: GLUCAP,  in the last 72 hours Scheduled Meds: . aspirin  325 mg Oral Daily  . atorvastatin  10 mg Oral q1800  . bisacodyl  10 mg Oral Daily   Or  . bisacodyl  10 mg Rectal Daily  . cephALEXin  500 mg Oral 3 times  per day  . digoxin  0.125 mg Oral Daily  . docusate  200 mg Oral Daily  . donepezil  5 mg Oral QHS  . furosemide  20 mg Oral Daily  . geriatric multivitamins-minerals  15 mL Oral TID WC  . ibuprofen  200 mg Oral TID WC  . Memantine HCl ER  1 capsule Oral q morning - 10a  . metoprolol tartrate  25 mg Oral BID  . pantoprazole sodium  40 mg Oral Daily  . sodium chloride  3 mL Intravenous Q12H  . tamsulosin  0.4 mg Oral QPC breakfast  . thiamine  100 mg Oral Daily   Continuous Infusions:  PRN Meds:.sodium chloride, lactulose, magnesium hydroxide, metoprolol, ondansetron (ZOFRAN) IV, sodium chloride, traMADol  No results found. Assessment/Plan: S/P Procedure(s) (LRB): Coronary Artery Bypass Grafting times one using right greater saphenous vein via endovein harvest. (N/A) AORTIC VALVE REPLACEMENT (AVR) (N/A) INTRAOPERATIVE TRANSESOPHAGEAL ECHOCARDIOGRAM (N/A)  1 remains stable 2 rate netter controlled for aflutter on higher dose metoprolol and digoxin 3 wife to evaluate nursing home options given to her     LOS: 9 days    GOLD,WAYNE E 03/22/2014  Patient seen and examined, agree with above

## 2014-03-23 MED ORDER — NYSTATIN 100000 UNIT/ML MT SUSP
5.0000 mL | Freq: Four times a day (QID) | OROMUCOSAL | Status: DC
  Administered 2014-03-23 – 2014-03-25 (×10): 500000 [IU] via ORAL
  Filled 2014-03-23 (×12): qty 5

## 2014-03-23 NOTE — Progress Notes (Signed)
Patient ambulated in hallway with nursing staff and walker assist x1. Back in chair sitter at bedside call bell within reach will monitor patient. [name] Galloway, [name] Gavia RN

## 2014-03-23 NOTE — Progress Notes (Signed)
Nursing note Patient ambulated in hallway 700 feet with nursing staff and walker. Patient back in room eating lunch. Will monitor patient. Derrin Currey, Bettina Gavia RN

## 2014-03-23 NOTE — Progress Notes (Addendum)
      [address]       [place] [address]             [phone]      10 Days Post-Op Procedure(s) (LRB): Coronary Artery Bypass Grafting times one using right greater saphenous vein via endovein harvest. (N/A) AORTIC VALVE REPLACEMENT (AVR) (N/A) INTRAOPERATIVE TRANSESOPHAGEAL ECHOCARDIOGRAM (N/A) Subjective: Nursing reports problems with swallowing, he says throat is quite sore   Objective: Vital signs in last 24 hours: Temp:  [98.1 F (36.7 C)-98.5 F (36.9 C)] 98.1 F (36.7 C) (08/02 0558) Pulse Rate:  [78-100] 83 (08/02 0558) Cardiac Rhythm:  [-] Atrial fibrillation;Atrial flutter (08/02 0746) Resp:  [16-17] 16 (08/02 0558) BP: (96-129)/(52-60) 100/52 mmHg (08/02 0558) SpO2:  [96 %-99 %] 96 % (08/02 0558) Weight:  [151 lb 6.4 oz (68.675 kg)] 151 lb 6.4 oz (68.675 kg) (08/02 0558)  Hemodynamic parameters for last 24 hours:    Intake/Output from previous day: 08/01 0701 - 08/02 0700 In: 240 [P.O.:240] Out: 2700 [Urine:2700] Intake/Output this shift:    General appearance: alert, cooperative and no distress Heart: regular rate and rhythm Lungs: clear to auscultation bilaterally Abdomen: benign Extremities: no edema Wound: incis healing well pharynx- + thrush  Lab Results: No results found for this basename: WBC, HGB, HCT, PLT,  in the last 72 hours BMET: No results found for this basename: NA, K, CL, CO2, GLUCOSE, BUN, CREATININE, CALCIUM,  in the last 72 hours  PT/INR: No results found for this basename: LABPROT, INR,  in the last 72 hours ABG    Component Value Date/Time   PHART 7.322* 03/14/2014 0432   HCO3 19.5* 03/14/2014 0432   TCO2 21 03/14/2014 1747   ACIDBASEDEF 6.0* 03/14/2014 0432   O2SAT 99.0 03/14/2014 0432   CBG (last 3)  No results found for this basename: GLUCAP,  in the last 72 hours Scheduled Meds: . aspirin  325 mg Oral Daily  . atorvastatin  10 mg Oral q1800  . bisacodyl  10 mg Oral Daily   Or  . bisacodyl  10 mg  Rectal Daily  . cephALEXin  500 mg Oral 3 times per day  . digoxin  0.125 mg Oral Daily  . docusate  200 mg Oral Daily  . donepezil  5 mg Oral QHS  . furosemide  20 mg Oral Daily  . geriatric multivitamins-minerals  15 mL Oral TID WC  . ibuprofen  200 mg Oral TID WC  . Memantine HCl ER  1 capsule Oral q morning - 10a  . metoprolol tartrate  25 mg Oral BID  . pantoprazole sodium  40 mg Oral Daily  . sodium chloride  3 mL Intravenous Q12H  . tamsulosin  0.4 mg Oral QPC breakfast  . thiamine  100 mg Oral Daily   Continuous Infusions:  PRN Meds:.sodium chloride, lactulose, magnesium hydroxide, metoprolol, ondansetron (ZOFRAN) IV, sodium chloride, traMADol  No results found. Assessment/Plan: S/P Procedure(s) (LRB): Coronary Artery Bypass Grafting times one using right greater saphenous vein via endovein harvest. (N/A) AORTIC VALVE REPLACEMENT (AVR) (N/A) INTRAOPERATIVE TRANSESOPHAGEAL ECHOCARDIOGRAM (N/A)  1 + Thrush in post pharynx- will add nystatin, he is on keflex for UTI 2 appears to be in and out of aflutter/junctional with controlled rates 3 awaits SNF placement    LOS: 10 days    GOLD,WAYNE E 03/23/2014  Patient seen and examined, agree with above In and out of flutter, rate controlled with digoxin and metoprolol Awaiting SNF

## 2014-03-23 NOTE — Progress Notes (Signed)
Pt ambulated in hallway with nursing staff. Pt more agitated this afternoon, back in room call bell with inr each sitter at bedside. Bawi Lakins, Bettina Gavia RN

## 2014-03-24 MED ORDER — TRAMADOL HCL 50 MG PO TABS: 50.0000 mg | ORAL_TABLET | Freq: Four times a day (QID) | ORAL | Status: DC | PRN

## 2014-03-24 MED ORDER — NYSTATIN 100000 UNIT/ML MT SUSP: 5.0000 mL | Freq: Four times a day (QID) | OROMUCOSAL | Status: DC

## 2014-03-24 MED ORDER — CEPHALEXIN 500 MG PO CAPS: 500.0000 mg | ORAL_CAPSULE | Freq: Three times a day (TID) | ORAL | Status: DC

## 2014-03-24 MED ORDER — DIGOXIN 125 MCG PO TABS: 0.1250 mg | ORAL_TABLET | Freq: Every day | ORAL | Status: DC

## 2014-03-24 MED ORDER — PANTOPRAZOLE SODIUM 40 MG PO PACK: 40.0000 mg | PACK | Freq: Every day | ORAL | Status: DC

## 2014-03-24 MED ORDER — ELDERTONIC PO ELIX
15.0000 mL | ORAL_SOLUTION | Freq: Two times a day (BID) | ORAL | Status: DC
  Administered 2014-03-24 – 2014-03-25 (×2): 15 mL via ORAL
  Filled 2014-03-24 (×4): qty 15

## 2014-03-24 MED ORDER — ASPIRIN 325 MG PO TABS: 325.0000 mg | ORAL_TABLET | Freq: Every day | ORAL | Status: DC

## 2014-03-24 MED ORDER — ATORVASTATIN CALCIUM 10 MG PO TABS: 10.0000 mg | ORAL_TABLET | Freq: Every day | ORAL | Status: DC

## 2014-03-24 MED ORDER — METOPROLOL TARTRATE 25 MG PO TABS: 25.0000 mg | ORAL_TABLET | Freq: Two times a day (BID) | ORAL | Status: DC

## 2014-03-24 NOTE — Clinical Social Work Note (Signed)
CSW received call from unit stating that family wanted to speak with CSW about placement for patient. CSW went to room, no family present. Patient gave CSW permission to call wife. CSW left message with wife. Waiting for call back. CSW notes that patient is ambulating at 350 feet min guard and [employer] will likely not authorize SNF stay for patient. This has been discussed with RNCM.     Liz Beach MSW, [place], [place], [phone]

## 2014-03-24 NOTE — Progress Notes (Signed)
Physical Therapy Treatment Patient Details Name: [name] Galloway MRN: [id number] DOB: [birth year] Today's Date: 03/24/2014    History of Present Illness This patient is a very unfortunate gentleman with a history of moderate to severe aortic stenosis in the past by his prior echo in 2013 who has a history of sigmoid volvulus as well as bowel and bladder incontinence. He was scheduled for colon resection but his preop evaluation including an echo showed severe AS .  Anesthesia was concerned about putting him under and surgery was postponed. The patient has some dementia which his wife says has been getting worse over the past 6 months. He remains very active and denies and shortness of breath or chest discomfort. s/p AVR , CABG    PT Comments    Pt admitted with AVR and CABG. Pt currently with functional limitations due to confusion needing cues for precautions.  Pt will benefit from skilled PT to increase their independence and safety with mobility to allow discharge to the venue listed below.    Follow Up Recommendations  Home health PT;Supervision/Assistance - 24 hour     Equipment Recommendations  Rolling walker with 5" wheels    Recommendations for Other Services       Precautions / Restrictions Precautions Precautions: Fall;Sternal Restrictions Weight Bearing Restrictions: Yes (sternal precautions)    Mobility  Bed Mobility                  Transfers Overall transfer level: Needs assistance Equipment used: Rolling walker (2 wheeled) Transfers: Sit to/from Stand Sit to Stand: Min guard         General transfer comment: Pt tends to push walker away and continue without it.  Requires cues for it's use  Ambulation/Gait Ambulation/Gait assistance: Min guard Ambulation Distance (Feet): 350 Feet Assistive device: Rolling walker (2 wheeled) Gait Pattern/deviations: Step-through pattern;Decreased stride length   Gait velocity interpretation: at or above normal speed  for age/gender General Gait Details: Cues for posture and to just use RW lightly for balance.  Pt confused and has sitter.   Stairs            Wheelchair Mobility    Modified Rankin (Stroke Patients Only)       Balance Overall balance assessment: Needs assistance;History of Falls Sitting-balance support: Feet supported;No upper extremity supported Sitting balance-Leahy Scale: Good     Standing balance support: Bilateral upper extremity supported;During functional activity Standing balance-Leahy Scale: Good Standing balance comment: Can stand without RW for short periods of time.                     Cognition Arousal/Alertness: Awake/alert Behavior During Therapy: WFL for tasks assessed/performed Overall Cognitive Status: History of cognitive impairments - at baseline  Gets distracted easily.     Memory: Decreased recall of precautions;Decreased short-term memory              Exercises General Exercises - Lower Extremity Ankle Circles/Pumps: AROM;Both;10 reps;Seated Long Arc Quad: AROM;Both;10 reps;Seated    General Comments        Pertinent Vitals/Pain VSS, No pain    Home Living                      Prior Function            PT Goals (current goals can now be found in the care plan section) Progress towards PT goals: Progressing toward goals    Frequency  Min 3X/week  PT Plan Current plan remains appropriate    Co-evaluation             End of Session Equipment Utilized During Treatment: Gait belt Activity Tolerance: Patient tolerated treatment well Patient left: with nursing/sitter in room;with chair alarm set;in chair;with call bell/phone within reach     Time: 1125-1150 PT Time Calculation (min): 25 min  Charges:  $Gait Training: 8-22 mins $Therapeutic Exercise: 8-22 mins                    G Codes:      Galloway,[name] Reaver 04-06-14, 12:51 PM [facility] [phone]  (pager)

## 2014-03-24 NOTE — Progress Notes (Addendum)
      [address]       [place], [address]             [phone]        11 Days Post-Op Procedure(s) (LRB): Coronary Artery Bypass Grafting times one using right greater saphenous vein via endovein harvest. (N/A) AORTIC VALVE REPLACEMENT (AVR) (N/A) INTRAOPERATIVE TRANSESOPHAGEAL ECHOCARDIOGRAM (N/A)  Subjective: Patient sleeping. Briefly awakened to examine.  Objective: Vital signs in last 24 hours: Temp:  [97.5 F (36.4 C)-98.6 F (37 C)] 98.6 F (37 C) (08/03 0420) Pulse Rate:  [78-94] 78 (08/03 0420) Cardiac Rhythm:  [-] Atrial fibrillation;Atrial flutter (08/02 2039) Resp:  [16-18] 17 (08/03 0420) BP: (91-111)/(55-66) 104/55 mmHg (08/03 0420) SpO2:  [93 %-97 %] 96 % (08/03 0420) Weight:  [150 lb 11.2 oz (68.357 kg)] 150 lb 11.2 oz (68.357 kg) (08/03 0500)  Pre op weight  71 kg Current Weight  03/24/14 150 lb 11.2 oz (68.357 kg)      Intake/Output from previous day: 08/02 0701 - 08/03 0700 In: 243 [P.O.:240; I.V.:3] Out: 500 [Urine:500]   Physical Exam:  Cardiovascular: IRRR IRRR Pulmonary: Clear to auscultation bilaterally; no rales, wheezes, or rhonchi. Abdomen: Soft, non tender, bowel sounds present. Extremities: No lower extremity edema. Ecchymosis right thigh and partial lower calf Wounds: Clean and dry.  No erythema or signs of infection.  Lab Results: CBC:No results found for this basename: WBC, HGB, HCT, PLT,  in the last 72 hours BMET: No results found for this basename: NA, K, CL, CO2, GLUCOSE, BUN, CREATININE, CALCIUM,  in the last 72 hours  PT/INR:  Lab Results  Component Value Date   INR 1.52* 03/13/2014   INR 1.07 03/11/2014   INR 1.0 02/20/2014   ABG:  INR: Will add last result for INR, ABG once components are confirmed Will add last 4 CBG results once components are confirmed  Assessment/Plan:  1. CV - Paroxysmal fib/flutter. On Lopressor 25 bid and Digoxin 0.125 daily. Not candidate for anticoagulation. 2.   Pulmonary - Encourage incentive spirometer 3. Volume Overload - On Lasix 20 daily. Is below pre op weight. Will not need at discharge. 4.  Acute blood loss anemia - Last H and H stable at 10.1 and 30.1 5. Thrush-continue Nystatin 6.UTI-continue Keflex (day #5) 7. Will need SNF when ready. Has sitter as has history of dementia  ZIMMERMAN,DONIELLE MPA-C 03/24/2014,7:31 AM   Chart reviewed, patient examined, agree with above. He is walking well and otherwise ready for discharge. He should continue Keflex for 7 days for UTI since he has a valve. He only had 10K colonies so it may just be colonization. He can go to SNF when a bed is available.

## 2014-03-24 NOTE — Progress Notes (Signed)
[id number] Completed education with pt and wife who voiced understanding. Pt will need his wife to reenforce ed. Discussed watching carbs and gave heart healthy diet. Got pt to demonstrate how to use IS. Discussed CRP 2 and permission was given to refer to [place]. Gave written ex ed to wife for walking instruction. Graylon Good RN BSN 03/24/2014 3:17 PM

## 2014-03-24 NOTE — Progress Notes (Signed)
CARDIAC REHAB PHASE I   PRE:  Rate/Rhythm: 59 SB PVCs  BP:  Supine: 102/44  Sitting:   Standing:    SaO2: 94%RA  MODE:  Ambulation: 850 ft   POST:  Rate/Rhythm: 83 SR PVCs  BP:  Supine:   Sitting: 116/53  Standing:    SaO2: 98%RA 0824-0857 Pt walked 850 ft on RA with rolling walker and asst x 1. Gait fairly steady. Gets distracted. To recliner after walk. Sitter in room. Encouraged pt to eat breakfast.   Graylon Good, RN BSN  03/24/2014 8:52 AM

## 2014-03-25 MED ORDER — TRAMADOL HCL 50 MG PO TABS: 50.0000 mg | ORAL_TABLET | Freq: Four times a day (QID) | ORAL | Status: DC | PRN

## 2014-03-25 MED ORDER — ASPIRIN 325 MG PO TABS: 325.0000 mg | ORAL_TABLET | Freq: Every day | ORAL | Status: DC

## 2014-03-25 MED ORDER — ASPIRIN 325 MG PO TBEC: 325.0000 mg | DELAYED_RELEASE_TABLET | Freq: Every day | ORAL | Status: DC

## 2014-03-25 MED ORDER — CEPHALEXIN 500 MG PO CAPS: 500.0000 mg | ORAL_CAPSULE | Freq: Three times a day (TID) | ORAL | Status: DC

## 2014-03-25 MED ORDER — NYSTATIN 100000 UNIT/ML MT SUSP: 5.0000 mL | Freq: Four times a day (QID) | OROMUCOSAL | Status: DC

## 2014-03-25 MED ORDER — METOPROLOL TARTRATE 25 MG PO TABS: 25.0000 mg | ORAL_TABLET | Freq: Two times a day (BID) | ORAL | Status: DC

## 2014-03-25 MED ORDER — ATORVASTATIN CALCIUM 10 MG PO TABS: 10.0000 mg | ORAL_TABLET | Freq: Every day | ORAL | Status: DC

## 2014-03-25 MED ORDER — ASPIRIN EC 325 MG PO TBEC
325.0000 mg | DELAYED_RELEASE_TABLET | Freq: Every day | ORAL | Status: DC
  Filled 2014-03-25: qty 1

## 2014-03-25 MED ORDER — DIGOXIN 125 MCG PO TABS: 0.1250 mg | ORAL_TABLET | Freq: Every day | ORAL | Status: DC

## 2014-03-25 NOTE — Progress Notes (Signed)
Occupational Therapy Treatment Patient Details Name: Sumedh Shinsato MRN: [id number] DOB: 1934-10-19 Today's Date: 03/25/2014    History of present illness This patient is a very unfortunate gentleman with a history of moderate to severe aortic stenosis in the past by his prior echo in 2013 who has a history of sigmoid volvulus as well as bowel and bladder incontinence. He was scheduled for colon resection but his preop evaluation including an echo showed severe AS .  Anesthesia was concerned about putting him under and surgery was postponed. The patient has some dementia which his wife says has been getting worse over the past 6 months. He remains very active and denies and shortness of breath or chest discomfort. s/p AVR , CABG   OT comments  Pt with poor awareness with of sternal precautions and abandoning RW. PT requires safety cues. Ot to continue to follow acutely for adl retraining.    Follow Up Recommendations  SNF (wife requesting PT update to SNF due to cognition)    Equipment Recommendations  None recommended by OT    Recommendations for Other Services      Precautions / Restrictions Precautions Precautions: Fall;Sternal Restrictions Weight Bearing Restrictions: Yes (sternal precautions)       Mobility Bed Mobility               General bed mobility comments: in chair on arrival  Transfers Overall transfer level: Needs assistance Equipment used: Rolling walker (2 wheeled) Transfers: Sit to/from Stand Sit to Stand: Min guard         General transfer comment: cues for safety with sternal precautions    Balance Overall balance assessment: Needs assistance   Sitting balance-Leahy Scale: Good     Standing balance support: Bilateral upper extremity supported;During functional activity Standing balance-Leahy Scale: Fair Standing balance comment: Worked on standing balance without AD with wide BOS.  Challenged with narrow BOS.                    ADL Overall ADL's : Needs assistance/impaired Eating/Feeding: Set up;Sitting   Grooming: Min guard;Standing (sternal precautions)                   Toilet Transfer: Min Lobbyist Details (indicate cue type and reason): pt using BIL UE         Functional mobility during ADLs: Min guard;Rolling walker General ADL Comments: Pt able to exit room and locate room without cues. 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 Pt no recall of surgery or precautions during session. pt abandoned rw to walk to sink level      Vision                     Perception     Praxis      Cognition   Behavior During Therapy: [facility] for tasks assessed/performed Overall Cognitive Status: History of cognitive impairments - at baseline       Memory: Decreased recall of precautions               Extremity/Trunk Assessment               Exercises General Exercises - Lower Extremity Hip Flexion/Marching: Strengthening;10 reps;Standing Heel Raises: Strengthening;10 reps;Standing Mini-Sqauts: Strengthening;10 reps;Standing   Shoulder Instructions       General Comments      Pertinent Vitals/ Pain       None reported  Home  Living                                          Prior Functioning/Environment              Frequency Min 2X/week     Progress Toward Goals  OT Goals(current goals can now be found in the care plan section)  Progress towards OT goals: Progressing toward goals  Acute Rehab OT Goals Patient Stated Goal: go home OT Goal Formulation: Patient unable to participate in goal setting Time For Goal Achievement: 04/08/14 Potential to Achieve Goals: Good ADL Goals Pt Will Perform Grooming: with supervision;standing Pt Will Perform Lower Body Bathing: with supervision;sit to/from stand Pt Will Perform Lower Body Dressing: with supervision;sit to/from stand Pt Will  Transfer to Toilet: with supervision;ambulating;regular height toilet Pt Will Perform Toileting - Clothing Manipulation and hygiene: with supervision;sit to/from stand Pt Will Perform Tub/Shower Transfer: Shower transfer;with supervision;shower seat;ambulating  Plan Discharge plan remains appropriate    Co-evaluation                 End of Session Equipment Utilized During Treatment: Rolling walker   Activity Tolerance Patient tolerated treatment well   Patient Left in chair;with call bell/phone within reach;with chair alarm set   Nurse Communication Mobility status;Precautions        Time: 0940-7680 OT Time Calculation (min): 23 min  Charges: OT General Charges $OT Visit: 1 Procedure OT Treatments $Self Care/Home Management : 23-37 mins  Peri Maris 03/25/2014, 3:56 PM Pager: [phone]

## 2014-03-25 NOTE — Clinical Social Work Placement (Signed)
Clinical Social Work Department CLINICAL SOCIAL WORK PLACEMENT NOTE 03/25/2014  Patient:  [name] Galloway, [name] Galloway  Account Number:  [id number] Admit date:  03/13/2014  Clinical Social Worker:  Lovey Newcomer  Date/time:  03/25/2014 11:48 AM  Clinical Social Work is seeking post-discharge placement for this patient at the following level of care:   [place]   (*CSW will update this form in Epic as items are completed)   03/21/2014  Patient/family provided with [facility] Department of Clinical Social Work's list of facilities offering this level of care within the geographic area requested by the patient (or if unable, by the patient's family).  03/21/2014  Patient/family informed of their freedom to choose among providers that offer the needed level of care, that participate in Medicare, Medicaid or managed care program needed by the patient, have an available bed and are willing to accept the patient.  03/21/2014  Patient/family informed of [facility]' ownership interest in [facility], as well as of the fact that they are under no obligation to receive care at this facility.  PASARR submitted to EDS on 03/21/2014 PASARR number received on 03/21/2014  FL2 transmitted to all facilities in geographic area requested by pt/family on  03/21/2014 FL2 transmitted to all facilities within larger geographic area on   Patient informed that his/her managed care company has contracts with or will negotiate with  certain facilities, including the following:     Patient/family informed of bed offers received:  03/24/2014 Patient chooses bed at [place] Physician recommends and patient chooses bed at    Patient to be transferred to [place] on   Patient to be transferred to facility by  Patient and family notified of transfer on  Name of family member notified:    The following physician request were entered in  Epic:   Additional Comments:   Liz Beach MSW, [place], [place], [phone]

## 2014-03-25 NOTE — Progress Notes (Addendum)
      [address]       ,[place] [address]             [phone]        12 Days Post-Op Procedure(s) (LRB): Coronary Artery Bypass Grafting times one using right greater saphenous vein via endovein harvest. (N/A) AORTIC VALVE REPLACEMENT (AVR) (N/A) INTRAOPERATIVE TRANSESOPHAGEAL ECHOCARDIOGRAM (N/A)  Subjective: Patient eating breakfast. He has no complaints  Objective: Vital signs in last 24 hours: Temp:  [97.9 F (36.6 C)] 97.9 F (36.6 C) (08/04 0700) Pulse Rate:  [59-86] 59 (08/04 0700) Cardiac Rhythm:  [-] Normal sinus rhythm (08/03 2125) Resp:  [18] 18 (08/04 0700) BP: (116-135)/(54-60) 126/54 mmHg (08/04 0700) SpO2:  [91 %-99 %] 99 % (08/04 0700)  Pre op weight  71 kg Current Weight  03/24/14 150 lb 11.2 oz (68.357 kg)      Intake/Output from previous day: 08/03 0701 - 08/04 0700 In: 643 [P.O.:640; I.V.:3] Out: 850 [Urine:850]   Physical Exam:  Cardiovascular: RRR Pulmonary: Clear to auscultation bilaterally; no rales, wheezes, or rhonchi. Abdomen: Soft, non tender, bowel sounds present. Extremities: No lower extremity edema. Ecchymosis right thigh and partial lower calf Wounds: Clean and dry.  No erythema or signs of infection.  Lab Results: CBC:No results found for this basename: WBC, HGB, HCT, PLT,  in the last 72 hours BMET: No results found for this basename: NA, K, CL, CO2, GLUCOSE, BUN, CREATININE, CALCIUM,  in the last 72 hours  PT/INR:  Lab Results  Component Value Date   INR 1.52* 03/13/2014   INR 1.07 03/11/2014   INR 1.0 02/20/2014   ABG:  INR: Will add last result for INR, ABG once components are confirmed Will add last 4 CBG results once components are confirmed  Assessment/Plan:  1. CV - Paroxysmal fib/flutter. On Lopressor 25 bid and Digoxin 0.125 daily. Not candidate for anticoagulation. 2.  Pulmonary - Encourage incentive spirometer 3.  Acute blood loss anemia - Last H and H stable at 10.1 and 30.1 4.  Thrush-continue Nystatin 5.UTI-continue Keflex (day #6) 6. Will need SNF when bed available  ZIMMERMAN,DONIELLE MPA-C 03/25/2014,7:25 AM    Chart reviewed, patient examined, agree with above. If insurance will not pay for SNF then he will have to go home with wife.

## 2014-03-25 NOTE — Clinical Social Work Note (Signed)
CSW met with patient and wife at bedside to speak about discharge plan. [facility] SNF has stated that they have not heard back from the insurance company and they believe it is very unlikely that patient will be approved for SNF as the patient is doing so well with PT. CSW explained this to wife who has decided that patient will be taken home with HHPT services, and she cannot pay privately for patient to go to SNF. CSW has updated RNCM. CSW signing off at this time.   Liz Beach MSW, [place], [place], [phone]

## 2014-03-25 NOTE — Discharge Instructions (Signed)
Coronary Artery Bypass Grafting, Care After °Refer to this sheet in the next few weeks. These instructions provide you with information on caring for yourself after your procedure. Your health care provider may also give you more specific instructions. Your treatment has been planned according to current medical practices, but problems sometimes occur. Call your health care provider if you have any problems or questions after your procedure. °WHAT TO EXPECT AFTER THE PROCEDURE °Recovery from surgery will be different for everyone. Some people feel well after 3 or 4 weeks, while for others it takes longer. After your procedure, it is typical to have the following: °· Nausea and a lack of appetite.   °· Constipation. °· Weakness and fatigue.   °· Depression or irritability.   °· Pain or discomfort at your incision site. °HOME CARE INSTRUCTIONS °· Take medicines only as directed by your health care provider. Do not stop taking medicines or start any new medicines without first checking with your health care provider. °· Take your pulse as directed by your health care provider. °· Perform deep breathing as directed by your health care provider. If you were given a device called an incentive spirometer, use it to practice deep breathing several times a day. Support your chest with a pillow or your arms when you take deep breaths or cough. °· Keep incision areas clean, dry, and protected. Remove or change any bandages (dressings) only as directed by your health care provider. You may have skin adhesive strips over the incision areas. Do not take the strips off. They will fall off on their own. °· Check incision areas daily for any swelling, redness, or drainage. °· If incisions were made in your legs, do the following: °¨ Avoid crossing your legs.   °¨ Avoid sitting for long periods of time. Change positions every 30 minutes.   °¨ Elevate your legs when you are sitting. °· Wear compression stockings as directed by your  health care provider. These stockings help keep blood clots from forming in your legs. °· Take showers once your health care provider approves. Until then, only take sponge baths. Pat incisions dry. Do not rub incisions with a washcloth or towel. Do not take baths, swim, or use a hot tub until your health care provider approves. °· Eat foods that are high in fiber, such as raw fruits and vegetables, whole grains, beans, and nuts. Meats should be lean cut. Avoid canned, processed, and fried foods. °· Drink enough fluid to keep your urine clear or pale yellow. °· Weigh yourself every day. This helps identify if you are retaining fluid that may make your heart and lungs work harder. °· Rest and limit activity as directed by your health care provider. You may be instructed to: °¨ Stop any activity at once if you have chest pain, shortness of breath, irregular heartbeats, or dizziness. Get help right away if you have any of these symptoms. °¨ Move around frequently for short periods or take short walks as directed by your health care provider. Increase your activities gradually. You may need physical therapy or cardiac rehabilitation to help strengthen your muscles and build your endurance. °¨ Avoid lifting, pushing, or pulling anything heavier than 10 lb (4.5 kg) for at least 6 weeks after surgery. °· Do not drive until your health care provider approves.  °· Ask your health care provider when you may return to work. °· Ask your health care provider when you may resume sexual activity. °· Keep all follow-up visits as directed by your health care   provider. This is important. SEEK MEDICAL CARE IF:  You have swelling, redness, increasing pain, or drainage at the site of an incision.  You have a fever.  You have swelling in your ankles or legs.  You have pain in your legs.   You gain 2 or more pounds (0.9 kg) a day.  You are nauseous or vomit.  You have diarrhea. SEEK IMMEDIATE MEDICAL CARE IF:  You have  chest pain that goes to your jaw or arms.  You have shortness of breath.   You have a fast or irregular heartbeat.   You notice a "clicking" in your breastbone (sternum) when you move.   You have numbness or weakness in your arms or legs.  You feel dizzy or light-headed.  MAKE SURE YOU:  Understand these instructions.  Will watch your condition.  Will get help right away if you are not doing well or get worse. Document Released: 02/25/2005 Document Revised: 12/23/2013 Document Reviewed: 01/15/2013 [facility] Patient Information 2015 [place], [place]. This information is not intended to replace advice given to you by your health care provider. Make sure you discuss any questions you have with your health care provider.  Aortic Valve Replacement, Care After Refer to this sheet in the next few weeks. These instructions provide you with information on caring for yourself after your procedure. Your health care provider may also give you specific instructions. Your treatment has been planned according to current medical practices, but problems sometimes occur. Call your health care provider if you have any problems or questions after your procedure. HOME CARE INSTRUCTIONS   Take medicines only as directed by your health care provider.  If your health care provider has prescribed elastic stockings, wear them as directed.  Take frequent naps or rest often throughout the day.  Avoid lifting over 10 lbs (4.5 kg) or pushing or pulling things with your arms for 6-8 weeks or as directed by your health care provider.  Avoid driving or airplane travel for 4-6 weeks after surgery or as directed by your health care provider. If you are riding in a car for an extended period, stop every 1-2 hours to stretch your legs. Keep a record of your medicines and medical history with you when traveling.  Do not drive or operate heavy machinery while taking pain medicine. (narcotics).  Do not cross your  legs.  Do not use any tobacco products including cigarettes, chewing tobacco, or electronic cigarettes. If you need help quitting, ask your health care provider.  Do not take baths, swim, or use a hot tub until your health care provider approves. Take showers once your health care provider approves. Pat incisions dry. Do not rub incisions with a washcloth or towel.  Avoid climbing stairs and using the handrail to pull yourself up for the first 2-3 weeks after surgery.  Return to work as directed by your health care provider.  Drink enough fluid to keep your urine clear or pale yellow.  Do not strain to have a bowel movement. Eat high-fiber foods if you become constipated. You may also take a medicine to help you have a bowel movement (laxative) as directed by your health care provider.  Resume sexual activity as directed by your health care provider. Men should not use medicines for erectile dysfunction until their doctor says it isokay.  If you had a certain type of heart condition in the past, you may need to take antibiotic medicine before having dental work or surgery. Let your dentist  and health care providers know if you had one or more of the following:  Previous endocarditis.  An artificial (prosthetic) heart valve.  Congenital heart disease. SEEK MEDICAL CARE IF:  You develop a skin rash.   You experience sudden changes in your weight.  You have a fever. SEEK IMMEDIATE MEDICAL CARE IF:   You develop chest pain that is not coming from your incision.  You have drainage (pus), redness, swelling, or pain at your incision site.   You develop shortness of breath or have difficulty breathing.   You have increased bleeding from your incision site.   You develop light-headedness.  MAKE SURE YOU:   Understand these directions.  Will watch your condition.  Will get help right away if you are not doing well or get worse. Document Released: 02/24/2005 Document  Revised: 12/23/2013 Document Reviewed: 05/22/2012 [facility] Patient Information 2015 [place], [place]. This information is not intended to replace advice given to you by your health care provider. Make sure you discuss any questions you have with your health care provider.

## 2014-03-25 NOTE — Progress Notes (Signed)
Physical Therapy Treatment Patient Details Name: [name] Galloway MRN: [id number] DOB: [birth year] Today's Date: 03/25/2014    History of Present Illness This patient is a very unfortunate gentleman with a history of moderate to severe aortic stenosis in the past by his prior echo in 2013 who has a history of sigmoid volvulus as well as bowel and bladder incontinence. He was scheduled for colon resection but his preop evaluation including an echo showed severe AS .  Anesthesia was concerned about putting him under and surgery was postponed. The patient has some dementia which his wife says has been getting worse over the past 6 months. He remains very active and denies and shortness of breath or chest discomfort. s/p AVR , CABG    PT Comments    Pt with decreased balance with narrow BOS challenges without RW.  Continue to work on mobility and balance to decrease fall risk.  Follow Up Recommendations  Home health PT;Supervision/Assistance - 24 hour     Equipment Recommendations  Rolling walker with 5" wheels    Recommendations for Other Services       Precautions / Restrictions Precautions Precautions: Fall;Sternal Restrictions Weight Bearing Restrictions: Yes (sternal precautions)    Mobility  Bed Mobility                  Transfers Overall transfer level: Needs assistance Equipment used: Rolling walker (2 wheeled) Transfers: Sit to/from Stand Sit to Stand: Min guard         General transfer comment: cues for following sternal precautions  Ambulation/Gait Ambulation/Gait assistance: Min guard Ambulation Distance (Feet): 300 Feet Assistive device: Rolling walker (2 wheeled) Gait Pattern/deviations: Step-through pattern     General Gait Details: Cues for posture and to just use RW lightly for balance.  Pt confused and has sitter.   Stairs Stairs: Yes Stairs assistance: Min assist Stair Management: One rail Right;Alternating pattern;Forwards;Backwards Number  of Stairs: 3 General stair comments: for strengthening did forwards and backwards.  Wheelchair Mobility    Modified Rankin (Stroke Patients Only)       Balance Overall balance assessment: Needs assistance   Sitting balance-Leahy Scale: Good     Standing balance support: No upper extremity supported Standing balance-Leahy Scale: Fair Standing balance comment: Worked on standing balance without AD with wide BOS.  Challenged with narrow BOS.                    Cognition Arousal/Alertness: Awake/alert Behavior During Therapy: WFL for tasks assessed/performed Overall Cognitive Status: History of cognitive impairments - at baseline       Memory: Decreased recall of precautions;Decreased short-term memory              Exercises General Exercises - Lower Extremity Hip Flexion/Marching: Strengthening;10 reps;Standing Heel Raises: Strengthening;10 reps;Standing Mini-Sqauts: Strengthening;10 reps;Standing    General Comments        Pertinent Vitals/Pain No c/o pain.    Home Living                      Prior Function            PT Goals (current goals can now be found in the care plan section) Acute Rehab PT Goals Time For Goal Achievement: 03/31/14 Potential to Achieve Goals: Fair Progress towards PT goals: Progressing toward goals    Frequency  Min 3X/week    PT Plan Current plan remains appropriate    Co-evaluation  End of Session Equipment Utilized During Treatment: Gait belt Activity Tolerance: Patient tolerated treatment well Patient left: in chair;with chair alarm set     Time: [phone] PT Time Calculation (min): 23 min  Charges:  $Gait Training: 8-22 mins $Therapeutic Exercise: 8-22 mins                    G Codes:      Faheem Ziemann LUBECK 03/25/2014, 12:12 PM

## 2014-03-25 NOTE — Progress Notes (Signed)
CARDIAC REHAB PHASE I   PRE:  Rate/Rhythm: 62 SR  BP:  Supine:   Sitting: 110/52  Standing:    SaO2: 98%RA  MODE:  Ambulation: 850 ft   POST:  Rate/Rhythm: 77 SR  BP:  Supine:   Sitting: 120/60  Standing:    SaO2: 100%RA 0932-3557 Pt quiet today. Did not sleep well. Walked 850 ft on RA with rolling walker and asst x 1 with fairly steady gait. To recliner after walk. Chair alarm on. Tolerated well.   Graylon Good, RN BSN  03/25/2014 9:34 AM

## 2014-03-25 NOTE — Progress Notes (Signed)
Wrong entery

## 2014-04-09 DIAGNOSIS — Z48812 Encounter for surgical aftercare following surgery on the circulatory system: Secondary | ICD-10-CM

## 2014-04-09 DIAGNOSIS — I2510 Atherosclerotic heart disease of native coronary artery without angina pectoris: Secondary | ICD-10-CM

## 2014-04-15 ENCOUNTER — Ambulatory Visit (INDEPENDENT_AMBULATORY_CARE_PROVIDER_SITE_OTHER): Payer: Medicare HMO | Admitting: Physician Assistant

## 2014-04-15 ENCOUNTER — Encounter: Payer: Self-pay | Admitting: Physician Assistant

## 2014-04-15 VITALS — BP 126/58 | HR 49 | Ht 72.0 in | Wt 157.0 lb

## 2014-04-15 DIAGNOSIS — Z952 Presence of prosthetic heart valve: Secondary | ICD-10-CM | POA: Insufficient documentation

## 2014-04-15 DIAGNOSIS — I480 Paroxysmal atrial fibrillation: Secondary | ICD-10-CM

## 2014-04-15 DIAGNOSIS — I10 Essential (primary) hypertension: Secondary | ICD-10-CM

## 2014-04-15 DIAGNOSIS — I350 Nonrheumatic aortic (valve) stenosis: Secondary | ICD-10-CM

## 2014-04-15 DIAGNOSIS — I2510 Atherosclerotic heart disease of native coronary artery without angina pectoris: Secondary | ICD-10-CM

## 2014-04-15 DIAGNOSIS — Z954 Presence of other heart-valve replacement: Secondary | ICD-10-CM

## 2014-04-15 DIAGNOSIS — I359 Nonrheumatic aortic valve disorder, unspecified: Secondary | ICD-10-CM

## 2014-04-15 DIAGNOSIS — E782 Mixed hyperlipidemia: Secondary | ICD-10-CM

## 2014-04-15 DIAGNOSIS — I4891 Unspecified atrial fibrillation: Secondary | ICD-10-CM

## 2014-04-15 LAB — BASIC METABOLIC PANEL
BUN: 28 mg/dL — AB (ref 6–23)
CALCIUM: 8.6 mg/dL (ref 8.4–10.5)
CHLORIDE: 102 meq/L (ref 96–112)
CO2: 28 mEq/L (ref 19–32)
CREATININE: 1.4 mg/dL (ref 0.4–1.5)
GFR: 53.53 mL/min — ABNORMAL LOW (ref >60.00)
Glucose, Bld: 81 mg/dL (ref 70–99)
Potassium: 4.5 mEq/L (ref 3.5–5.1)
Sodium: 135 mEq/L (ref 135–145)

## 2014-04-15 NOTE — Progress Notes (Signed)
Cardiology Office Note    Date:  04/15/2014   ID:  [name] Galloway, DOB 11-11-1934, MRN [id number]  PCP:  Jani Gravel, MD  Cardiologist:  Dr. Casandra Doffing     History of Present Illness: [name] Galloway is a 79 y.o. male with a hx of aortic stenosis, HTN, HL.  He has a recent hx of sigmoid volvulus and was scheduled for colon resection.  However, pre-op echo demonstrated progression of AS to severe.  LHC was done and demonstrated single vessel CAD.  He was admitted 7/23-8/4 for bioprosthetic AVR + CABG x 1 (S-RCA).  Post op course complicated by ABL anemia requiring transfusion with PRBCs, AFib that converted to NSR with IV amiodarone and AKI.    He returns for FU.  He is here with his wife who helps with the hx (baseline dementia).  Denies chest soreness.  No significant dyspnea.  Denies orthopnea, PND, edema.  No syncope or near syncope.  No fever, cough.   Studies:  - LHC (7/15):  prox OM1 50%, prox RCA 90%   - Echo (5/15):  Mod LVH, EF 55-60%, Gr 1 DD, severe AS (mean 38 mmHg), heavy MAC, mild MR, severe LAE, mild RAE  - Carotid US (7/15):  Bilateral ICA 1-39%.   Recent Labs/Images: 10/24/2013: TSH 0.88  03/11/2014: ALT 16  03/20/2014: Creatinine 1.37*; Hemoglobin 10.1*; Potassium 3.3*   Dg Chest 2 View  03/11/2014   CLINICAL DATA:  Preop for cardiac surgery, former smoking history  EXAM: CHEST  2 VIEW  COMPARISON:  CT chest of 04/29/2013 and portable chest x-ray of 11/01/2008  FINDINGS: No active infiltrate or effusion is seen. Mediastinal and hilar contours are unchanged. The heart is within normal limits in size and calcification of the mitral annulus is noted. No bony abnormality is seen.  IMPRESSION: No active cardiopulmonary disease.   Electronically Signed   By: Ivar Drape M.D.   On: 03/11/2014 15:13     Wt Readings from Last 3 Encounters:  03/25/14 154 lb 5.2 oz (70 kg)  03/25/14 154 lb 5.2 oz (70 kg)  03/11/14 157 lb 9.6 oz (71.487 kg)     Past Medical History    Diagnosis Date  . Coronary artery disease   . Dementia   . Psoriasis   . Sigmoid volvulus   . Incontinence of bowel   . Incontinence of urine   . Anemia   . Hypertension     CONTROLLED SINCE WEIGHT LOSS  . H/O hiatal hernia   . Atrial flutter     Current Outpatient Prescriptions  Medication Sig Dispense Refill  . aspirin EC 325 MG EC tablet Take 1 tablet (325 mg total) by mouth daily.  30 tablet  0  . atorvastatin (LIPITOR) 10 MG tablet Take 1 tablet (10 mg total) by mouth daily at 6 PM.  30 tablet  1  . cephALEXin (KEFLEX) 500 MG capsule Take 1 capsule (500 mg total) by mouth every 8 (eight) hours. For 2 days then stop.  4 capsule  0  . Coenzyme Q10 (COQ10) 200 MG CAPS Take 1 capsule by mouth every morning.       . digoxin (LANOXIN) 0.125 MG tablet Take 1 tablet (0.125 mg total) by mouth daily.  30 tablet  1  . donepezil (ARICEPT) 5 MG tablet Take 5 mg by mouth at bedtime.      . Memantine HCl ER (NAMENDA XR) 28 MG CP24 Take 1 capsule by mouth every morning.       Marland Kitchen  metoprolol tartrate (LOPRESSOR) 25 MG tablet Take 1 tablet (25 mg total) by mouth 2 (two) times daily.  60 tablet  1  . nystatin (MYCOSTATIN) 100000 UNIT/ML suspension Take 5 mLs (500,000 Units total) by mouth 4 (four) times daily. For thrush.  60 mL  0  . Phosphatidylserine-DHA-EPA (VAYACOG) 100-19.5-6.5 MG CAPS Take 1 capsule by mouth every morning.       . tamsulosin (FLOMAX) 0.4 MG CAPS capsule Take 0.4 mg by mouth daily after breakfast.       . traMADol (ULTRAM) 50 MG tablet Take 1 tablet (50 mg total) by mouth every 6 (six) hours as needed for severe pain.  30 tablet  0  . Vitamin D, Ergocalciferol, (DRISDOL) 50000 UNITS CAPS capsule Take 50,000 Units by mouth every 7 (seven) days.       No current facility-administered medications for this visit.     Allergies:   Ciprofloxacin   Social History:  The patient  reports that he quit smoking about 42 years ago. He has never used smokeless tobacco. He reports that  he does not drink alcohol or use illicit drugs.   Family History:  The patient's family history includes Cancer in his father, mother, and sister; Heart attack in his son; Heart disease in his son; Hypertension in his mother.   ROS:  Please see the history of present illness.   He has chronic diarrhea.   All other systems reviewed and negative.   PHYSICAL EXAM: VS:  BP 126/58  Pulse 49  Ht 6' (1.829 m)  Wt 157 lb (71.215 kg)  BMI 21.29 kg/m2 Well nourished, well developed, in no acute distress HEENT: normal Neck: no JVD Chest: median sternotomy scar well healed without erythema or d/c Cardiac:  normal S1, S2; RRR; 7-8/2 systolic murmurRUSB Lungs:  clear to auscultation bilaterally, no wheezing, rhonchi or rales Abd: soft, nontender, no hepatomegaly Ext: no edema Skin: warm and dry Neuro:  CNs 2-12 intact, no focal abnormalities noted  EKG:  Sinus brady, HR 49, LBBB     ASSESSMENT AND PLAN:  Aortic stenosis S/P Bioprosthetic AVR (aortic valve replacement):  Progressing well since DC.  He sees Dr. Cyndia Bent next week.  I encouraged him to call cardiac rehab at [facility] to arrange his first visit after seeing Dr. Cyndia Bent.  We discussed the importance of SBE prophylaxis.  Schedule FU echo.    Paroxysmal atrial fibrillation:  Maintaining NSR.  He is now bradycardic.  He has a hx of normal LVF.  DC digoxin.  Continue metoprolol, ASA.  CAD s/p CABG x 1:  No angina.  Continue ASA.  He is intol to statins.   Essential hypertension, benign:  Controlled.  He has a hx of hypokalemia.  Check BMET today.   Mixed hyperlipidemia:  He is intol to statins.  Disposition:  FU with Dr. Casandra Doffing in 8 weeks.    Signed, Versie Starks, MHS 04/15/2014 8:34 AM    [facility] [place], [place], [place]  [address] Phone: [phone]; Fax: [phone]

## 2014-04-15 NOTE — Patient Instructions (Signed)
STOP DIGOXIN AS OF TODAY PER SCOTT WEAVER, PAC  LAB WORK TODAY; BMET  Your physician has requested that you have an echocardiogram. Echocardiography is a painless test that uses sound waves to create images of your heart. It provides your doctor with information about the size and shape of your heart and how well your heart's chambers and valves are working. This procedure takes approximately one hour. There are no restrictions for this procedure.  Your physician recommends that you schedule a follow-up appointment in: [place] DR. VARANASI

## 2014-04-16 ENCOUNTER — Telehealth: Payer: Self-pay | Admitting: *Deleted

## 2014-04-16 NOTE — Telephone Encounter (Signed)
pt's wife notified about lab results with verbal understanding, wife said  thank you.

## 2014-04-18 ENCOUNTER — Ambulatory Visit (HOSPITAL_COMMUNITY): Payer: Medicare HMO | Attending: Physician Assistant | Admitting: Radiology

## 2014-04-18 ENCOUNTER — Other Ambulatory Visit: Payer: Self-pay

## 2014-04-18 DIAGNOSIS — I10 Essential (primary) hypertension: Secondary | ICD-10-CM | POA: Insufficient documentation

## 2014-04-18 DIAGNOSIS — Z952 Presence of prosthetic heart valve: Secondary | ICD-10-CM

## 2014-04-18 DIAGNOSIS — Z954 Presence of other heart-valve replacement: Secondary | ICD-10-CM | POA: Insufficient documentation

## 2014-04-18 DIAGNOSIS — E785 Hyperlipidemia, unspecified: Secondary | ICD-10-CM | POA: Insufficient documentation

## 2014-04-18 DIAGNOSIS — I519 Heart disease, unspecified: Secondary | ICD-10-CM | POA: Insufficient documentation

## 2014-04-18 DIAGNOSIS — I2510 Atherosclerotic heart disease of native coronary artery without angina pectoris: Secondary | ICD-10-CM | POA: Diagnosis not present

## 2014-04-18 DIAGNOSIS — I059 Rheumatic mitral valve disease, unspecified: Secondary | ICD-10-CM | POA: Diagnosis not present

## 2014-04-18 DIAGNOSIS — I359 Nonrheumatic aortic valve disorder, unspecified: Secondary | ICD-10-CM | POA: Diagnosis not present

## 2014-04-18 DIAGNOSIS — I350 Nonrheumatic aortic (valve) stenosis: Secondary | ICD-10-CM

## 2014-04-18 NOTE — Progress Notes (Signed)
Echocardiogram performed.  

## 2014-04-20 ENCOUNTER — Encounter: Payer: Self-pay | Admitting: Physician Assistant

## 2014-04-21 ENCOUNTER — Telehealth: Payer: Self-pay | Admitting: *Deleted

## 2014-04-21 ENCOUNTER — Other Ambulatory Visit: Payer: Self-pay | Admitting: Surgery

## 2014-04-21 DIAGNOSIS — I359 Nonrheumatic aortic valve disorder, unspecified: Secondary | ICD-10-CM

## 2014-04-21 NOTE — Telephone Encounter (Signed)
pt's wife notified about echo results with verbal understanding and verified that pt is to stay off digoxin right now. I said yes, pt is to stop digoxin unless otherwise informed by cardiologist, wife said ok and thank you.

## 2014-04-23 ENCOUNTER — Ambulatory Visit
Admission: RE | Admit: 2014-04-23 | Discharge: 2014-04-23 | Disposition: A | Discharge status: Home / Self Care | Payer: Medicare HMO | Source: Ambulatory Visit | Attending: Surgery | Admitting: Surgery

## 2014-04-23 ENCOUNTER — Ambulatory Visit (INDEPENDENT_AMBULATORY_CARE_PROVIDER_SITE_OTHER): Payer: Self-pay | Admitting: Surgery

## 2014-04-23 ENCOUNTER — Encounter: Payer: Self-pay | Admitting: Surgery

## 2014-04-23 VITALS — BP 126/66 | HR 60 | Resp 20 | Ht 72.0 in | Wt 157.0 lb

## 2014-04-23 DIAGNOSIS — Z954 Presence of other heart-valve replacement: Secondary | ICD-10-CM

## 2014-04-23 DIAGNOSIS — Z952 Presence of prosthetic heart valve: Secondary | ICD-10-CM

## 2014-04-23 DIAGNOSIS — I359 Nonrheumatic aortic valve disorder, unspecified: Secondary | ICD-10-CM

## 2014-04-23 DIAGNOSIS — I350 Nonrheumatic aortic (valve) stenosis: Secondary | ICD-10-CM

## 2014-04-23 DIAGNOSIS — Z951 Presence of aortocoronary bypass graft: Secondary | ICD-10-CM

## 2014-04-23 DIAGNOSIS — I2510 Atherosclerotic heart disease of native coronary artery without angina pectoris: Secondary | ICD-10-CM

## 2014-04-23 NOTE — Progress Notes (Signed)
HPI:  Patient returns for routine postoperative follow-up having undergone CABG x 1 and AVR with a 23 mm pericardial valve on 03/13/2014. The patient's early postoperative recovery while in the hospital was notable for acute blood loss anemia requiring transfusion, brief atrial fibrillation converted with amiodarone and mild elevation of his creatinine. Since hospital discharge the patient reports that he has been feeling well. He is here with his wife who helps with the hx (baseline dementia). He denies chest soreness. No dyspnea. Denies orthopnea, PND, edema.      Current Outpatient Prescriptions  Medication Sig Dispense Refill  . aspirin EC 325 MG EC tablet Take 1 tablet (325 mg total) by mouth daily.  30 tablet  0  . Coenzyme Q10 (COQ10) 200 MG CAPS Take 1 capsule by mouth every morning.       . donepezil (ARICEPT) 5 MG tablet Take 5 mg by mouth at bedtime.      Marland Kitchen KLOR-CON M20 20 MEQ tablet Take 20 mEq by mouth 2 (two) times daily.       . Memantine HCl ER (NAMENDA XR) 28 MG CP24 Take 1 capsule by mouth every morning.       . metoprolol tartrate (LOPRESSOR) 25 MG tablet Take 1 tablet (25 mg total) by mouth 2 (two) times daily.  60 tablet  1  . Phosphatidylserine-DHA-EPA (VAYACOG) 100-19.5-6.5 MG CAPS Take 1 capsule by mouth every morning.       . tamsulosin (FLOMAX) 0.4 MG CAPS capsule Take 0.4 mg by mouth daily after breakfast.        No current facility-administered medications for this visit.    Physical Exam: BP 126/66  Pulse 60  Resp 20  Ht 6' (1.829 m)  Wt 157 lb (71.215 kg)  BMI 21.29 kg/m2  SpO2 97% He looks well Lungs are clear Cardiac exam shows a regular rate and rhythm with normal heart sounds. The chest incision is healing well and the sternum is stable. The leg incisions are healing well and there is no significant edema.   Diagnostic Tests:  CLINICAL DATA: Status post aortic valve replacement 03/13/2014.  EXAM:  CHEST 2 VIEW  COMPARISON: PA and  lateral chest 03/16/2014. CT chest 04/29/2013.  FINDINGS:  The lungs are clear. Heart size is normal. 7 intact median  sternotomy wires are unchanged. Aortic valve replacement is noted.  There is no pneumothorax or pleural effusion.  IMPRESSION:  No acute disease. Status post aortic valve replacement.  Electronically Signed  By: Inge Rise M.D.  On: 04/23/2014 10:42      *[facility]* [address], [place] [address] [phone]  ------------------------------------------------------------------- Transthoracic Echocardiography  Patient: [name] Galloway, [name] Galloway MR #: [id number] Study Date: 04/18/2014 Gender: M Age: 79 Height: 182.9 cm Weight: 71.2 kg BSA: 1.9 m^2 Pt. Status: Room:  SONOGRAPHER Victorio Palm, RDCS ATTENDING Letha Cape, Scott T REFERRING [place], Scott T REFERRING [place], Williams, Outpatient  cc:  ------------------------------------------------------------------- LV EF: 60% - 65%  ------------------------------------------------------------------- Indications: 424.1 Aortic valve disorders.  ------------------------------------------------------------------- History: PMH: Acquired from the patient and from the patient&'s chart. Coronary artery disease. Risk factors: Hypertension. Dyslipidemia.  ------------------------------------------------------------------- Study Conclusions  - Left ventricle: The cavity size was normal. Wall thickness was increased in a pattern of mild LVH. Systolic function was normal. The estimated ejection fraction was in the range of 60% to 65%. Wall motion was normal; there were no regional wall motion abnormalities. Features are consistent with  a pseudonormal left ventricular filling pattern, with concomitant abnormal relaxation and increased filling pressure (grade 2 diastolic dysfunction). - Ventricular septum: Septal motion showed abnormal  function, dyssynergy, and paradox. These changes are consistent with intraventricular conduction delay. - Aortic valve: A bioprosthesis was present and functioning normally. There was trivial regurgitation. - Mitral valve: Severely calcified annulus. Mildly thickened leaflets . There was mild to moderate regurgitation directed centrally. - Left atrium: The atrium was moderately dilated.  ------------------------------------------------------------------- Labs, prior tests, procedures, and surgery: Echocardiography (May 2015). The aortic valve showed severe stenosis. EF was 60%. Aortic valve: peak gradient of 59 mm Hg and mean gradient of 38 mm Hg.  Coronary artery bypass grafting (August 2015). Aortic valve replacement with a bioprosthetic valve. Transthoracic echocardiography. M-mode, complete 2D, spectral Doppler, and color Doppler. Birthdate: Patient birthdate: [birth year]. Age: Patient is 79 yr old. Sex: Gender: male. BMI: 21.3 kg/m^2. Blood pressure: 120/75 Patient status: Outpatient. Study date: Study date: 04/18/2014. Study time: 01:48 PM. Location: [facility]  -------------------------------------------------------------------  ------------------------------------------------------------------- Left ventricle: The cavity size was normal. Wall thickness was increased in a pattern of mild LVH. Systolic function was normal. The estimated ejection fraction was in the range of 60% to 65%. Wall motion was normal; there were no regional wall motion abnormalities. Features are consistent with a pseudonormal left ventricular filling pattern, with concomitant abnormal relaxation and increased filling pressure (grade 2 diastolic dysfunction).  ------------------------------------------------------------------- Aortic valve: A bioprosthesis was present and functioning normally. Doppler: There was trivial regurgitation. VTI ratio of LVOT to aortic valve: 0.49. Peak velocity  ratio of LVOT to aortic valve: 0.43. Mean velocity ratio of LVOT to aortic valve: 0.52. Mean gradient (S): 14 mm Hg. Peak gradient (S): 27 mm Hg.  ------------------------------------------------------------------- Aorta: The aorta was normal, not dilated, and non-diseased.  ------------------------------------------------------------------- Mitral valve: Severely calcified annulus. Mildly thickened leaflets . Doppler: There was mild to moderate regurgitation directed centrally. Peak gradient (D): 4 mm Hg.  ------------------------------------------------------------------- Left atrium: The atrium was moderately dilated.  ------------------------------------------------------------------- Right ventricle: The cavity size was normal. Wall thickness was normal. Systolic function was normal.  ------------------------------------------------------------------- Ventricular septum: Septal motion showed abnormal function, dyssynergy, and paradox. These changes are consistent with intraventricular conduction delay.  ------------------------------------------------------------------- Pulmonic valve: Structurally normal valve. Cusp separation was normal. Doppler: Transvalvular velocity was within the normal range. There was no significant regurgitation.  ------------------------------------------------------------------- Tricuspid valve: Structurally normal valve. Leaflet separation was normal. Doppler: Transvalvular velocity was within the normal range. There was mild regurgitation.  ------------------------------------------------------------------- Pulmonary artery: Systolic pressure was within the normal range.  ------------------------------------------------------------------- Right atrium: The atrium was normal in size.  ------------------------------------------------------------------- Post procedure conclusions Ascending Aorta:  - The aorta was normal, not dilated, and  non-diseased.  ------------------------------------------------------------------- Measurements  Left ventricle Value Reference LV ID, ED, PLAX chordal (L) 39 mm 43 - 52 LV ID, ES, PLAX chordal 23 mm 23 - 38 LV fx shortening, PLAX chordal 41 % >=29 LV PW thickness, ED 14 mm --------- IVS/LV PW ratio, ED 0.79 <=1.3 LV e&', lateral 3.92 cm/s --------- LV E/e&', lateral 25.36 --------- LV e&', medial 3.92 cm/s --------- LV E/e&', medial 25.36 --------- LV e&', average 3.92 cm/s --------- LV E/e&', average 25.36 ---------  Ventricular septum Value Reference IVS thickness, ED 11 mm ---------  LVOT Value Reference LVOT peak velocity, S 111 cm/s --------- LVOT mean velocity, S 89.3 cm/s --------- LVOT VTI, S 26.3 cm ---------  Aortic valve Value Reference Aortic valve peak velocity, S 260 cm/s --------- Aortic valve  mean velocity, S 172 cm/s --------- Aortic valve VTI, S 53.8 cm --------- Aortic mean gradient, S 14 mm Hg --------- Aortic peak gradient, S 27 mm Hg --------- VTI ratio, LVOT/AV 0.49 --------- Velocity ratio, peak, LVOT/AV 0.43 --------- Velocity ratio, mean, LVOT/AV 0.52 ---------  Aorta Value Reference Aortic root ID, ED 29 mm ---------  Left atrium Value Reference LA ID, A-P, ES 44 mm --------- LA ID/bsa, A-P (H) 2.32 cm/m^2 <=2.2  Mitral valve Value Reference Mitral E-wave peak velocity 99.4 cm/s --------- Mitral A-wave peak velocity 108 cm/s --------- Mitral deceleration time (H) 275 ms 150 - 230 Mitral peak gradient, D 4 mm Hg --------- Mitral E/A ratio, peak 0.9 ---------  Pulmonary arteries Value Reference PA pressure, S, DP 24 mm Hg <=30  Tricuspid valve Value Reference Tricuspid regurg peak velocity 229 cm/s --------- Tricuspid peak RV-RA gradient 21 mm Hg --------- Tricuspid maximal regurg velocity, 229 cm/s --------- PISA  Systemic veins Value Reference Estimated CVP 3 mm Hg ---------  Right ventricle Value Reference RV pressure, S, DP  24 mm Hg <=30 RV s&', lateral, S 8.7 cm/s ---------  Legend: (L) and (H) mark values outside specified reference range.  ------------------------------------------------------------------- Prepared and Electronically Authenticated by  Sanda Klein, MD 2015-08-28T16:07:49    Impression:  Overall I think he is doing well. His follow up electrolytes are normal and his creatinine is at baseline of 1.3. His follow up echo looks fine. He has mild MR which he had preop.  I encouraged him to continue walking. He is planning to participate in cardiac rehab. His wife asked if he could return to driving. She says she never lets him go out without her because he has gotten lost before.  He should not lift anything heavier than 10 lbs for three months postop. He was in the process of being worked up by Dr. Marcello Moores of [facility] for a history of sigmoid volvulus that will require surgery. I think this can be done in another month is it is an urgent problem since he is recovering so well.  Plan:  He will follow up with Dr. Irish Lack and will contact me if he develops any problems with his incisions.

## 2014-04-27 ENCOUNTER — Other Ambulatory Visit (HOSPITAL_COMMUNITY): Payer: Self-pay | Admitting: Physician Assistant

## 2014-05-01 ENCOUNTER — Other Ambulatory Visit (HOSPITAL_COMMUNITY): Payer: Self-pay | Admitting: Physician Assistant

## 2014-06-06 ENCOUNTER — Other Ambulatory Visit: Payer: Self-pay

## 2014-06-18 ENCOUNTER — Ambulatory Visit (INDEPENDENT_AMBULATORY_CARE_PROVIDER_SITE_OTHER): Payer: Medicare HMO | Admitting: Interventional Cardiology

## 2014-06-18 ENCOUNTER — Encounter: Payer: Self-pay | Admitting: Interventional Cardiology

## 2014-06-18 VITALS — BP 118/68 | HR 60 | Ht 72.0 in | Wt 158.8 lb

## 2014-06-18 DIAGNOSIS — E782 Mixed hyperlipidemia: Secondary | ICD-10-CM

## 2014-06-18 DIAGNOSIS — Z952 Presence of prosthetic heart valve: Secondary | ICD-10-CM

## 2014-06-18 DIAGNOSIS — Z954 Presence of other heart-valve replacement: Secondary | ICD-10-CM

## 2014-06-18 DIAGNOSIS — I4892 Unspecified atrial flutter: Secondary | ICD-10-CM

## 2014-06-18 DIAGNOSIS — I2510 Atherosclerotic heart disease of native coronary artery without angina pectoris: Secondary | ICD-10-CM

## 2014-06-18 DIAGNOSIS — I350 Nonrheumatic aortic (valve) stenosis: Secondary | ICD-10-CM

## 2014-06-18 NOTE — Progress Notes (Signed)
Patient ID: Musa Rewerts, male   DOB: 29-Jul-1935, 79 y.o.   MRN: [id number]   Cardiology Office Note    Date:  06/18/2014   ID:  Oretha Milch, DOB [birth year], MRN [id number]  PCP:  Jani Gravel, MD  Cardiologist:  Dr. Casandra Doffing     History of Present Illness: [name] Galloway is a 79 y.o. male with a hx of aortic stenosis, HTN, HL.  He has a recent hx of sigmoid volvulus and was scheduled for colon resection.  However, pre-op echo demonstrated progression of AS to severe.  LHC was done and demonstrated single vessel CAD.  He was admitted 7/23-8/4 for bioprosthetic AVR + CABG x 1 (S-RCA).  Post op course complicated by ABL anemia requiring transfusion with PRBCs, AFib that converted to NSR with IV amiodarone and AKI.    He returns for FU.  He is here with his wife who helps with the hx (baseline dementia).    Denies chest soreness.  No significant dyspnea.  Denies orthopnea, PND, edema.  No syncope or near syncope.  No fever, cough.   Studies:  - LHC (7/15):  prox OM1 50%, prox RCA 90%   - Echo (5/15):  Mod LVH, EF 55-60%, Gr 1 DD, severe AS (mean 38 mmHg), heavy MAC, mild MR, severe LAE, mild RAE  - Carotid US (7/15):  Bilateral ICA 1-39%.   Recent Labs/Images: 10/24/2013: TSH 0.88  03/11/2014: ALT 16  03/20/2014: Hemoglobin 10.1*  04/15/2014: Creatinine 1.4; Potassium 4.5   Dg Chest 2 View  03/11/2014   CLINICAL DATA:  Preop for cardiac surgery, former smoking history  EXAM: CHEST  2 VIEW  COMPARISON:  CT chest of 04/29/2013 and portable chest x-ray of 11/01/2008  FINDINGS: No active infiltrate or effusion is seen. Mediastinal and hilar contours are unchanged. The heart is within normal limits in size and calcification of the mitral annulus is noted. No bony abnormality is seen.  IMPRESSION: No active cardiopulmonary disease.   Electronically Signed   By: Ivar Drape M.D.   On: 03/11/2014 15:13     Wt Readings from Last 3 Encounters:  06/18/14 158 lb 12.8 oz (72.031 kg)    04/23/14 157 lb (71.215 kg)  04/15/14 157 lb (71.215 kg)     Past Medical History  Diagnosis Date  . Coronary artery disease   . Dementia   . Psoriasis   . Sigmoid volvulus   . Incontinence of bowel   . Incontinence of urine   . Anemia   . Hypertension     CONTROLLED SINCE WEIGHT LOSS  . H/O hiatal hernia   . Atrial flutter   . Hx of echocardiogram     Echo (8/15):  Mild LVH, EF 60-65%, Gr 2 DD, AVR ok (mean 14 mmHg), MAC, mild to mod MR, mod LAE    Current Outpatient Prescriptions  Medication Sig Dispense Refill  . aspirin EC 325 MG EC tablet Take 1 tablet (325 mg total) by mouth daily.  30 tablet  0  . Coenzyme Q10 (COQ10) 200 MG CAPS Take 1 capsule by mouth every morning.       . donepezil (ARICEPT) 5 MG tablet Take 5 mg by mouth at bedtime.      Marland Kitchen KLOR-CON M20 20 MEQ tablet Take 20 mEq by mouth 2 (two) times daily.       . Memantine HCl ER (NAMENDA XR) 28 MG CP24 Take 1 capsule by mouth every morning.       . metoprolol  tartrate (LOPRESSOR) 25 MG tablet Take 1 tablet (25 mg total) by mouth 2 (two) times daily.  60 tablet  1  . Phosphatidylserine-DHA-EPA (VAYACOG) 100-19.5-6.5 MG CAPS Take 1 capsule by mouth every morning.       . tamsulosin (FLOMAX) 0.4 MG CAPS capsule Take 0.4 mg by mouth daily after breakfast.        No current facility-administered medications for this visit.     Allergies:   Ciprofloxacin   Social History:  The patient  reports that he quit smoking about 42 years ago. He has never used smokeless tobacco. He reports that he does not drink alcohol or use illicit drugs.   Family History:  The patient's family history includes Cancer in his father, mother, and sister; Heart attack in his son; Heart disease in his son; Hypertension in his mother.   ROS:  Please see the history of present illness.   He has chronic diarrhea. Poo memory. All other systems reviewed and negative.   PHYSICAL EXAM: VS:  BP 118/68  Pulse 60  Ht 6' (1.829 m)  Wt 158 lb  12.8 oz (72.031 kg)  BMI 21.53 kg/m2 Well nourished, well developed, in no acute distress HEENT: normal Neck: no JVD Chest: median sternotomy scar well healed without erythema or d/c Cardiac:  normal S1, S2; RRR; 6-5/9 systolic murmurRUSB Lungs:  clear to auscultation bilaterally, no wheezing, rhonchi or rales Abd: soft, nontender, no hepatomegaly Ext: no edema Skin: warm and dry Neuro:  CNs 2-12 intact, no focal abnormalities noted  EKG:  Sinus rhythm,  LBBB   , PAC, PVC  ASSESSMENT AND PLAN:  Aortic stenosis S/P Bioprosthetic AVR (aortic valve replacement):  Progressing well.  Did not want to do Cardiac re[facility] at [facility] due to memory issues.  We discussed the importance of SBE prophylaxis.  Schedule FU echo.    Paroxysmal atrial fibrillation:  Maintaining NSR.  He was bradycardic.  He has a hx of normal LVF.  DCed digoxin.  Continue metoprolol, ASA 81 mg daily.  CAD s/p CABG x 1:  No angina.  Continue ASA.  He is intolerant to statins.   Essential hypertension, benign:  Controlled.    Mixed hyperlipidemia:  He is intol to multiple statins.  Given memory issues, would not push for statin therapy at this time.  Lipids checked by Dr. Maudie Mercury.      Teresita Madura   06/18/2014 2:13 PM    [facility] [place], [place], [place]  [address] Phone: [phone]; Fax: [phone]

## 2014-06-18 NOTE — Patient Instructions (Signed)
Your physician recommends that you continue on your current medications as directed. Please refer to the Current Medication list given to you today.  Your physician wants you to follow-up in: 1 year with Dr. Varanasi. You will receive a reminder letter in the mail two months in advance. If you don't receive a letter, please call our office to schedule the follow-up appointment.  

## 2014-06-29 ENCOUNTER — Other Ambulatory Visit: Payer: Self-pay | Admitting: Physician Assistant

## 2014-07-24 ENCOUNTER — Emergency Department (HOSPITAL_COMMUNITY)
Admission: EM | Admit: 2014-07-24 | Discharge: 2014-07-25 | Disposition: A | Discharge status: Home / Self Care | Payer: Medicare HMO | Attending: Emergency Medicine | Admitting: Emergency Medicine

## 2014-07-24 ENCOUNTER — Emergency Department (HOSPITAL_COMMUNITY): Payer: Medicare HMO

## 2014-07-24 ENCOUNTER — Encounter (HOSPITAL_COMMUNITY): Payer: Self-pay | Admitting: *Deleted

## 2014-07-24 DIAGNOSIS — Z8719 Personal history of other diseases of the digestive system: Secondary | ICD-10-CM | POA: Insufficient documentation

## 2014-07-24 DIAGNOSIS — I2510 Atherosclerotic heart disease of native coronary artery without angina pectoris: Secondary | ICD-10-CM | POA: Diagnosis not present

## 2014-07-24 DIAGNOSIS — F0390 Unspecified dementia without behavioral disturbance: Secondary | ICD-10-CM | POA: Insufficient documentation

## 2014-07-24 DIAGNOSIS — I10 Essential (primary) hypertension: Secondary | ICD-10-CM | POA: Diagnosis not present

## 2014-07-24 DIAGNOSIS — R55 Syncope and collapse: Secondary | ICD-10-CM | POA: Diagnosis not present

## 2014-07-24 DIAGNOSIS — Z862 Personal history of diseases of the blood and blood-forming organs and certain disorders involving the immune mechanism: Secondary | ICD-10-CM | POA: Diagnosis not present

## 2014-07-24 DIAGNOSIS — Z872 Personal history of diseases of the skin and subcutaneous tissue: Secondary | ICD-10-CM | POA: Diagnosis not present

## 2014-07-24 DIAGNOSIS — Z7982 Long term (current) use of aspirin: Secondary | ICD-10-CM | POA: Diagnosis not present

## 2014-07-24 DIAGNOSIS — Z9889 Other specified postprocedural states: Secondary | ICD-10-CM | POA: Diagnosis not present

## 2014-07-24 DIAGNOSIS — Z87891 Personal history of nicotine dependence: Secondary | ICD-10-CM | POA: Diagnosis not present

## 2014-07-24 DIAGNOSIS — Z79899 Other long term (current) drug therapy: Secondary | ICD-10-CM | POA: Diagnosis not present

## 2014-07-24 DIAGNOSIS — Z951 Presence of aortocoronary bypass graft: Secondary | ICD-10-CM | POA: Diagnosis not present

## 2014-07-24 DIAGNOSIS — Z952 Presence of prosthetic heart valve: Secondary | ICD-10-CM | POA: Insufficient documentation

## 2014-07-24 DIAGNOSIS — R400 Somnolence: Secondary | ICD-10-CM | POA: Diagnosis present

## 2014-07-24 LAB — CBC WITH DIFFERENTIAL/PLATELET
Basophils Absolute: 0.1 10*3/uL (ref 0.0–0.1)
Basophils Relative: 1 % (ref 0–1)
EOS PCT: 5 % (ref 0–5)
Eosinophils Absolute: 0.3 10*3/uL (ref 0.0–0.7)
HCT: 33.4 % — ABNORMAL LOW (ref 39.0–52.0)
HEMOGLOBIN: 11 g/dL — AB (ref 13.0–17.0)
LYMPHS ABS: 0.9 10*3/uL (ref 0.7–4.0)
Lymphocytes Relative: 15 % (ref 12–46)
MCH: 27.3 pg (ref 26.0–34.0)
MCHC: 32.9 g/dL (ref 30.0–36.0)
MCV: 82.9 fL (ref 78.0–100.0)
MONOS PCT: 5 % (ref 3–12)
Monocytes Absolute: 0.3 10*3/uL (ref 0.1–1.0)
Neutro Abs: 4.6 10*3/uL (ref 1.7–7.7)
Neutrophils Relative %: 74 % (ref 43–77)
PLATELETS: 181 10*3/uL (ref 150–400)
RBC: 4.03 MIL/uL — AB (ref 4.22–5.81)
RDW: 14 % (ref 11.5–15.5)
WBC: 6.2 10*3/uL (ref 4.0–10.5)

## 2014-07-24 LAB — BASIC METABOLIC PANEL
Anion gap: 13 (ref 5–15)
BUN: 28 mg/dL — AB (ref 6–23)
CALCIUM: 8.8 mg/dL (ref 8.4–10.5)
CO2: 24 mEq/L (ref 19–32)
Chloride: 98 mEq/L (ref 96–112)
Creatinine, Ser: 1.64 mg/dL — ABNORMAL HIGH (ref 0.50–1.35)
GFR calc Af Amer: 45 mL/min — ABNORMAL LOW (ref >90)
GFR calc non Af Amer: 39 mL/min — ABNORMAL LOW (ref >90)
GLUCOSE: 122 mg/dL — AB (ref 70–99)
POTASSIUM: 4.1 meq/L (ref 3.7–5.3)
Sodium: 135 mEq/L — ABNORMAL LOW (ref 137–147)

## 2014-07-24 LAB — I-STAT TROPONIN, ED: Troponin i, poc: 0 ng/mL (ref 0.00–0.08)

## 2014-07-24 MED ORDER — SODIUM CHLORIDE 0.9 % IV BOLUS (SEPSIS)
500.0000 mL | Freq: Once | INTRAVENOUS | Status: AC
  Administered 2014-07-24: 500 mL via INTRAVENOUS

## 2014-07-24 NOTE — ED Notes (Signed)
Attempted to do an in and out cath, no urine return. Upon removal of in and out cath there was blood in the tube. RN present, MD Delo notified and assessed.

## 2014-07-24 NOTE — ED Notes (Signed)
Pt arrives from home [facility]. Pt wife states that he was at the bar and lost consciousness for 2-3 min. Per EMS - orthostatics. Pt had open heart surgery 03/2014. Pt denies pain. No apparent injuries.

## 2014-07-24 NOTE — ED Provider Notes (Signed)
CSN: [id number]     Arrival date & time 07/24/14  1959 History   First MD Initiated Contact with Patient 07/24/14 2006     Chief Complaint  Patient presents with  . Loss of Consciousness     (Consider location/radiation/quality/duration/timing/severity/associated sxs/prior Treatment) HPI  [name] Galloway is a 79 y.o. male with PMH of CAD and CABG in August 2015, aortic valve replacement, dementia, hypertension, a flutter presenting after syncopal episode. Per wife patient was standing at the counter and lost consciousness for 2 min. Patient has no complaints. No apparent injuries. Patient denies headaches, chest pain, shortness of breath, nausea, vomiting, abdominal pain, diarrhea, back pain, numbness, tingling, weakness, visual changes. Patient denies any pain anywhere. She denies fevers, chills, recent illnesses. Patient denies urinary complaints. I spoke with his wife who witnessed the event. She stated he felt nauseated and she helped him sit. At this time he LOC for 2-3 mins. He felt looked pale and was diaphoretic. He came too alert and oriented to his baseline. He did not hit is head. Pt denies headache, slurred speech, visual changes. Wife denies change in mentation or behavior. Pt with cardiology and cardiothoracic follow ups last month and he was progressing well.   Last echo August 2015 with EF 60-65%. No history of CHF. - Echo (5/15): Mod LVH, EF 55-60%, Gr 1 DD, severe AS (mean 38 mmHg), heavy MAC, mild MR, severe LAE, mild RAE - Carotid US (7/15): Bilateral ICA 1-39%.   Past Medical History  Diagnosis Date  . Coronary artery disease   . Dementia   . Psoriasis   . Sigmoid volvulus   . Incontinence of bowel   . Incontinence of urine   . Anemia   . Hypertension     CONTROLLED SINCE WEIGHT LOSS  . H/O hiatal hernia   . Atrial flutter   . Hx of echocardiogram     Echo (8/15):  Mild LVH, EF 60-65%, Gr 2 DD, AVR ok (mean 14 mmHg), MAC, mild to mod MR, mod LAE   Past  Surgical History  Procedure Laterality Date  . Colonoscopy  2011    Blockage  . Flexible sigmoidoscopy  09/20/2011    Procedure: FLEXIBLE SIGMOIDOSCOPY;  Surgeon: Jeryl Columbia, MD;  Location: [facility] ENDOSCOPY;  Service: Endoscopy;  Laterality: N/A;  . Hernia repair    . Tonsillectomy    . Cardiac catheterization    . Coronary artery bypass graft N/A 03/13/2014    Procedure: Coronary Artery Bypass Grafting times one using right greater saphenous vein via endovein harvest.;  Surgeon: Gaye Pollack, MD;  Location: [facility] OR;  Service: Open Heart Surgery;  Laterality: N/A;  . Aortic valve replacement N/A 03/13/2014    Procedure: AORTIC VALVE REPLACEMENT (AVR);  Surgeon: Gaye Pollack, MD;  Location: [place];  Service: Open Heart Surgery;  Laterality: N/A;  . Intraoperative transesophageal echocardiogram N/A 03/13/2014    Procedure: INTRAOPERATIVE TRANSESOPHAGEAL ECHOCARDIOGRAM;  Surgeon: Gaye Pollack, MD;  Location: [facility] OR;  Service: Open Heart Surgery;  Laterality: N/A;   Family History  Problem Relation Age of Onset  . Cancer Mother   . Hypertension Mother   . Cancer Father   . Cancer Sister   . Heart attack Son   . Heart disease Son     before age 4   History  Substance Use Topics  . Smoking status: Former Smoker -- 0.50 packs/day for 10 years    Quit date: 09/20/1971  . Smokeless tobacco: Never Used  .  Alcohol Use: No    Review of Systems  Constitutional: Negative for fever and chills.  HENT: Negative for congestion and rhinorrhea.   Eyes: Negative for visual disturbance.  Respiratory: Negative for cough and shortness of breath.   Cardiovascular: Negative for chest pain and palpitations.  Gastrointestinal: Negative for nausea, vomiting and diarrhea.  Genitourinary: Negative for dysuria and hematuria.  Musculoskeletal: Negative for back pain and gait problem.  Skin: Negative for rash.  Neurological: Negative for weakness and headaches.      Allergies  Ciprofloxacin  Home  Medications   Prior to Admission medications   Medication Sig Start Date End Date Taking? Authorizing Provider  aspirin EC 325 MG EC tablet Take 1 tablet (325 mg total) by mouth daily. 03/25/14   Donielle Liston Alba, PA-C  Coenzyme Q10 (COQ10) 200 MG CAPS Take 1 capsule by mouth every morning.     Historical Provider, MD  donepezil (ARICEPT) 5 MG tablet Take 5 mg by mouth at bedtime.    Historical Provider, MD  KLOR-CON M20 20 MEQ tablet Take 20 mEq by mouth 2 (two) times daily.  02/24/14   Historical Provider, MD  Memantine HCl ER (NAMENDA XR) 28 MG CP24 Take 1 capsule by mouth every morning.     Historical Provider, MD  metoprolol tartrate (LOPRESSOR) 25 MG tablet Take 1 tablet (25 mg total) by mouth 2 (two) times daily. 03/25/14   Donielle Liston Alba, PA-C  Phosphatidylserine-DHA-EPA (VAYACOG) 100-19.5-6.5 MG CAPS Take 1 capsule by mouth every morning.     Historical Provider, MD  tamsulosin (FLOMAX) 0.4 MG CAPS capsule Take 0.4 mg by mouth daily after breakfast.  10/15/13   Historical Provider, MD   BP 125/58 mmHg  Pulse 61  Temp(Src) 98.1 F (36.7 C)  Resp 14  Ht 6' (1.829 m)  Wt 180 lb (81.647 kg)  BMI 24.41 kg/m2  SpO2 100% Physical Exam  Constitutional: He appears well-developed and well-nourished. No distress.  HENT:  Head: Normocephalic and atraumatic.  Mouth/Throat: Oropharynx is clear and moist.  Eyes: Conjunctivae and EOM are normal. Pupils are equal, round, and reactive to light. Right eye exhibits no discharge. Left eye exhibits no discharge.  Neck: Normal range of motion. Neck supple.  No nuchal rigidity  Cardiovascular: Normal rate and regular rhythm.   Pulmonary/Chest: Effort normal and breath sounds normal. No respiratory distress. He has no wheezes.  Abdominal: Soft. Bowel sounds are normal. He exhibits no distension. There is no tenderness.  Neurological: He is alert. No cranial nerve deficit. Coordination normal.  Patient oriented to person and place but not time.  Speech is clear and goal oriented. Peripheral visual fields intact. Strength 5/5 in upper and lower extremities. Sensation intact. No pronator drift. Normal gait.    Skin: Skin is warm and dry. He is not diaphoretic.  Nursing note and vitals reviewed.   ED Course  Procedures (including critical care time) Labs Review Labs Reviewed  CBC WITH DIFFERENTIAL - Abnormal; Notable for the following:    RBC 4.03 (*)    Hemoglobin 11.0 (*)    HCT 33.4 (*)    All other components within normal limits  BASIC METABOLIC PANEL  URINALYSIS, ROUTINE W REFLEX MICROSCOPIC  I-STAT TROPOININ, ED    Imaging Review No results found.   EKG Interpretation   Date/Time:  Thursday July 24 2014 20:06:28 EST Ventricular Rate:  58 PR Interval:  176 QRS Duration: 152 QT Interval:  494 QTC Calculation: 485 R Axis:   5 Text  Interpretation:  Sinus rhythm Multiple ventricular premature  complexes Left atrial enlargement Left bundle branch block Confirmed by  DELOS  MD, DOUGLAS ([id number]) on 07/24/2014 8:35:33 PM      MDM   Final diagnoses:  Syncope   Pt with history of CABG in August, aortic valve replacement and dementia presenting with syncopal episode with prodrome. Workup without identified cause of syncope. Pt afebrile, normotensive without tachypnea or tachycardia. Pt with mild anemia. Electrolytes normal. Elevation of creatinine but not far from pt baseline. EKG with PVCs but prior EKG captured this as well.  Troponin negative. CXR without infiltrates. Unable to obtain urine due to traumatic catheterization. Pt not orthostatic but with mild signs of dehydration on exam. 500 bolus provided. Pt in no distress without complaints. I discussed with family option of inpatient observation due to the syncope. Pt and wife refused. I think this is reasonable due to patient's history, negative workup, recent positive cardiology outpatient evaluations and pt's well appearance. Patient is afebrile, nontoxic, and  in no acute distress. Patient is appropriate for outpatient management and is stable for discharge.  Discussed return precautions with patient. Discussed all results and patient verbalizes understanding and agrees with plan.  This is a shared patient. This patient was discussed with the physician who saw and evaluated the patient and agrees with the plan.     Pura Spice, PA-C 07/26/14 [place], MD 07/27/14 1011

## 2014-07-25 NOTE — Discharge Instructions (Signed)
Return to the emergency room with worsening of symptoms, new symptoms or with symptoms that are concerning , especially chest pain that feels like a pressure, spreads to left arm or jaw, worse with exertion, associated with nausea, vomiting, shortness of breath and/or sweating OR severe worsening of headache, visual or speech changes, weakness in face, arms or legs. Call to make an appointment with your primary care provider in two days. Number provided above.

## 2014-07-31 ENCOUNTER — Encounter (HOSPITAL_COMMUNITY): Payer: Self-pay | Admitting: Interventional Cardiology

## 2014-09-04 ENCOUNTER — Encounter (HOSPITAL_COMMUNITY): Payer: Self-pay | Admitting: General Surgery

## 2014-09-05 ENCOUNTER — Ambulatory Visit: Payer: Medicare HMO | Admitting: Family

## 2015-01-07 ENCOUNTER — Ambulatory Visit (INDEPENDENT_AMBULATORY_CARE_PROVIDER_SITE_OTHER): Payer: Medicare HMO | Admitting: Interventional Cardiology

## 2015-01-07 ENCOUNTER — Encounter: Payer: Self-pay | Admitting: Interventional Cardiology

## 2015-01-07 VITALS — BP 108/60 | HR 66 | Ht 72.0 in | Wt 168.0 lb

## 2015-01-07 DIAGNOSIS — R55 Syncope and collapse: Secondary | ICD-10-CM | POA: Diagnosis not present

## 2015-01-07 DIAGNOSIS — Z954 Presence of other heart-valve replacement: Secondary | ICD-10-CM | POA: Diagnosis not present

## 2015-01-07 DIAGNOSIS — Z952 Presence of prosthetic heart valve: Secondary | ICD-10-CM

## 2015-01-07 DIAGNOSIS — N183 Chronic kidney disease, stage 3 unspecified: Secondary | ICD-10-CM

## 2015-01-07 DIAGNOSIS — N189 Chronic kidney disease, unspecified: Secondary | ICD-10-CM | POA: Insufficient documentation

## 2015-01-07 NOTE — Progress Notes (Signed)
Patient ID: [name] Galloway, male   DOB: 1935/03/23, 80 y.o.   MRN: [id number]     Cardiology Office Note   Date:  01/07/2015   ID:  [name] Galloway, DOB January 20, 1935, MRN [id number]  PCP:  Jani Gravel, MD    No chief complaint on file. syncope   Wt Readings from Last 3 Encounters:  01/07/15 168 lb (76.204 kg)  07/24/14 180 lb (81.647 kg)  06/18/14 158 lb 12.8 oz (72.031 kg)       History of Present Illness: [name] Galloway is a 80 y.o. male  with a hx of aortic stenosis, HTN, HL. He has a recent hx of sigmoid volvulus and was scheduled for colon resection. However, pre-op echo demonstrated progression of AS to severe. LHC was done and demonstrated single vessel CAD. He was admitted 7/23-8/4 for bioprosthetic AVR + CABG x 1 (S-RCA).  He has had several syncopal episodes.  He was standing up and then passed out.  He does not remember a prodrome but he has dementia.  He was eating and drinking ok at the time.    He has had bradycardia in the past.  Dig was stopped. He continued metoprolol.    Past Medical History  Diagnosis Date  . Coronary artery disease   . Dementia   . Psoriasis   . Sigmoid volvulus   . Incontinence of bowel   . Incontinence of urine   . Anemia   . Hypertension     CONTROLLED SINCE WEIGHT LOSS  . H/O hiatal hernia   . Atrial flutter   . Hx of echocardiogram     Echo (8/15):  Mild LVH, EF 60-65%, Gr 2 DD, AVR ok (mean 14 mmHg), MAC, mild to mod MR, mod LAE    Past Surgical History  Procedure Laterality Date  . Colonoscopy  2011    Blockage  . Flexible sigmoidoscopy  09/20/2011    Procedure: FLEXIBLE SIGMOIDOSCOPY;  Surgeon: Jeryl Columbia, MD;  Location: [facility] END[facility]COPY;  Service: Endoscopy;  Laterality: N/A;  . Hernia repair    . Tonsillectomy    . Cardiac catheterization    . Coronary artery bypass graft N/A 03/13/2014    Procedure: Coronary Artery Bypass Grafting times one using right greater saphenous vein via endovein harvest.;  Surgeon:  Gaye Pollack, MD;  Location: [facility] OR;  Service: Open Heart Surgery;  Laterality: N/A;  . Aortic valve replacement N/A 03/13/2014    Procedure: AORTIC VALVE REPLACEMENT (AVR);  Surgeon: Gaye Pollack, MD;  Location: [place];  Service: Open Heart Surgery;  Laterality: N/A;  . Intraoperative transesophageal echocardiogram N/A 03/13/2014    Procedure: INTRAOPERATIVE TRANSESOPHAGEAL ECHOCARDIOGRAM;  Surgeon: Gaye Pollack, MD;  Location: [facility] OR;  Service: Open Heart Surgery;  Laterality: N/A;  . Left and right heart catheterization with coronary angiogram N/A 02/26/2014    Procedure: LEFT AND RIGHT HEART CATHETERIZATION WITH CORONARY ANGIOGRAM;  Surgeon: Jettie Booze, MD;  Location: [facility] CATH LAB;  Service: Cardiovascular;  Laterality: N/A;     Current Outpatient Prescriptions  Medication Sig Dispense Refill  . aspirin EC 81 MG tablet Take 81 mg by mouth daily.    . Coenzyme Q10 (COQ10) 200 MG CAPS Take 1 capsule by mouth every morning.     . donepezil (ARICEPT) 5 MG tablet Take 5 mg by mouth at bedtime.    Marland Kitchen KLOR-CON M20 20 MEQ tablet Take 20 mEq by mouth 2 (two) times daily.     . Memantine HCl ER (  NAMENDA XR) 28 MG CP24 Take 1 capsule by mouth every morning.     . metoprolol tartrate (LOPRESSOR) 25 MG tablet Take 1 tablet (25 mg total) by mouth 2 (two) times daily. 60 tablet 1  . Phosphatidylserine-DHA-EPA (VAYACOG) 100-19.5-6.5 MG CAPS Take 1 capsule by mouth every morning.     . tamsulosin (FLOMAX) 0.4 MG CAPS capsule Take 0.4 mg by mouth daily after breakfast.      No current facility-administered medications for this visit.    Allergies:   Ciprofloxacin    Social History:  The patient  reports that he quit smoking about 43 years ago. He has never used smokeless tobacco. He reports that he does not drink alcohol or use illicit drugs.   Family History:  The patient's *family history includes Cancer in his father, mother, and sister; Heart attack in his son; Heart disease in his son;  Hypertension in his mother.    ROS:  Please see the history of present illness.   Otherwise, review of systems are positive for syncope.   All other systems are reviewed and negative.    PHYSICAL EXAM: VS:  BP 108/60 mmHg  Pulse 66  Ht 6' (1.829 m)  Wt 168 lb (76.204 kg)  BMI 22.78 kg/m2  SpO2 97% , BMI Body mass index is 22.78 kg/(m^2). GEN: Well nourished, well developed, in no acute distress HEENT: normal Neck: no JVD, carotid bruits, or masses Cardiac: RRR; 2/6 systolic murmur, rubs, or gallops,no edema  Respiratory:  clear to auscultation bilaterally, normal work of breathing GI: soft, nontender, nondistended, + BS MS: no deformity or atrophy Skin: warm and dry, no rash Neuro:  Strength and sensation are intact Psych: euthymic mood, full affect    Recent Labs: 03/11/2014: ALT 16 03/14/2014: Magnesium 2.0 07/24/2014: BUN 28*; Creatinine 1.64*; Hemoglobin 11.0*; Platelets 181; Potassium 4.1; Sodium 135*   Lipid Panel No results found for: CHOL, TRIG, HDL, CHOLHDL, VLDL, LDLCALC, LDLDIRECT   Other studies Reviewed: Additional studies/ records that were reviewed today with results demonstrating: echo 2015: Normal LVEF. Normally functioning prosthetic aortic valve.   ASSESSMENT AND PLAN:  1. AVR: s/p bioprosthetic valve.  Use SBE prophylaxis with dental visits.   2. Syncope:  Stop metoprolol.  Unsure etiology, but metoprolol may be controibuting to orthostasis.  If it recurs, consider a monitor.  Sounds orthostatic but difficult to get a history given the patient's dementia. 3. Preoperative eval: GI surgery was planned, but it has not happened yet.   4. CRI: stable per recent labs in December.  Recehecked by Dr. Maudie Mercury. Obtain most recent lab results.    Current medicines are reviewed at length with the patient today.  The patient concerns regarding his medicines were addressed.  The following changes have been made:  Stop metoprolol  Labs/ tests ordered today include:    No orders of the defined types were placed in this encounter.    Recommend 150 minutes/week of aerobic exercise Low fat, low carb, high fiber diet recommended  Disposition:   FU in 1 year   Teresita Madura., MD  01/07/2015 4:05 PM    [facility] [address], [place], [place]  [address] Phone: [phone]; Fax: [phone]

## 2015-01-07 NOTE — Patient Instructions (Signed)
Medication Instructions:  Your physician has recommended you make the following change in your medication:  1) STOP Metoprolol    Labwork: NONE  Testing/Procedures: NONE  Follow-Up: Your physician wants you to follow-up in: 12 months with [place]. You will receive a reminder letter in the mail two months in advance. If you don't receive a letter, please call our office to schedule the follow-up appointment.   Any Other Special Instructions Will Be Listed Below (If Applicable).

## 2015-01-08 ENCOUNTER — Encounter: Payer: Self-pay | Admitting: Interventional Cardiology

## 2015-02-06 ENCOUNTER — Ambulatory Visit: Payer: Medicare HMO | Admitting: Family

## 2015-02-06 ENCOUNTER — Encounter: Payer: Self-pay | Admitting: Family

## 2015-02-10 ENCOUNTER — Encounter: Payer: Self-pay | Admitting: Family

## 2015-02-10 ENCOUNTER — Ambulatory Visit (INDEPENDENT_AMBULATORY_CARE_PROVIDER_SITE_OTHER): Payer: Medicare HMO | Admitting: Family

## 2015-02-10 VITALS — BP 122/78 | HR 80 | Resp 16 | Ht 72.0 in | Wt 165.0 lb

## 2015-02-10 DIAGNOSIS — I728 Aneurysm of other specified arteries: Secondary | ICD-10-CM

## 2015-02-10 NOTE — Progress Notes (Signed)
Established splenic artery aneurysm  History of Present Illness  [name] Galloway is a 80 y.o. (05/03/1935) male patient of Dr. Bridgett Larsson who presents with chief complaint: none. The patient was hospitalized with bowel obstruction. As part of that work up, an incidental SAA was discovered. It was verified with CTA Abd/pelvis. The patient denies any fever or chills. He denies any LUQ or L flank pain. He has had prior abd pain which was diagnosed as bowel obstruction due to a possible volvulus. There is no family history of aneurysmal disease. He is a distant smoker over 25 years ago. His atherosclerotic risk factors included: hypercholesterolemia, HTN, and prior smoking. Pt returns today for provider evaluation within a year of his repeat CTA, 2 years after the last CTA which is due January 2017.  Wife states pt has diarrhea and urinary incontinence, but that he is no longer losing weight, that he has a voracious appetite.  Wife also states that pt has worsening dementia.   At pt's 09/09/13 visit with Dr. Bridgett Larsson, Dr. Lianne Moris assessment is as follows:  Based on the CTA (of September 2014), I would not be surprised if this SAA is actually a penetrating ulcer in the proximal SAA. Regardless, I would treat it the same as a bland SAA.  Repair criteria is >2 cm or sx, though in a male elder patient with multiple co-morbidities some liberalization of the criteria might be indicated.   At this point, no surgical or endovascular intervention is indicated. If the SAA grows upon subsequent CTA in 2 years or the patient starts to develop embolization to his spleen, further work-up including celiac angiography and possible intervention would be indicated.  I discussed in depth with the patient the nature of atherosclerosis, and emphasized the importance of maximal medical management including strict control of blood pressure, blood glucose, and lipid levels, antiplatelet agents, obtaining regular  exercise, and cessation of smoking.   I will order the CTA abd/pelvis in 2 years and he will follow up with [place] at that time.     Past Medical History  Diagnosis Date  . Coronary artery disease   . Dementia   . Psoriasis   . Sigmoid volvulus   . Incontinence of bowel   . Incontinence of urine   . Anemia   . Hypertension     CONTROLLED SINCE WEIGHT LOSS  . H/O hiatal hernia   . Atrial flutter   . Hx of echocardiogram     Echo (8/15):  Mild LVH, EF 60-65%, Gr 2 DD, AVR ok (mean 14 mmHg), MAC, mild to mod MR, mod LAE    Social History History  Substance Use Topics  . Smoking status: Former Smoker -- 0.50 packs/day for 10 years    Quit date: 09/20/1971  . Smokeless tobacco: Never Used  . Alcohol Use: No    Family History Family History  Problem Relation Age of Onset  . Cancer Mother   . Hypertension Mother   . Cancer Father   . Cancer Sister   . Heart attack Son   . Heart disease Son     before age 11    Surgical History Past Surgical History  Procedure Laterality Date  . Colonoscopy  2011    Blockage  . Flexible sigmoidoscopy  09/20/2011    Procedure: FLEXIBLE SIGMOIDOSCOPY;  Surgeon: Jeryl Columbia, MD;  Location: [facility] ENDOSCOPY;  Service: Endoscopy;  Laterality: N/A;  . Hernia repair    . Tonsillectomy    .  Cardiac catheterization    . Coronary artery bypass graft N/A 03/13/2014    Procedure: Coronary Artery Bypass Grafting times one using right greater saphenous vein via endovein harvest.;  Surgeon: Gaye Pollack, MD;  Location: [facility] OR;  Service: Open Heart Surgery;  Laterality: N/A;  . Aortic valve replacement N/A 03/13/2014    Procedure: AORTIC VALVE REPLACEMENT (AVR);  Surgeon: Gaye Pollack, MD;  Location: [place];  Service: Open Heart Surgery;  Laterality: N/A;  . Intraoperative transesophageal echocardiogram N/A 03/13/2014    Procedure: INTRAOPERATIVE TRANSESOPHAGEAL ECHOCARDIOGRAM;  Surgeon: Gaye Pollack, MD;  Location: [facility] OR;  Service: Open Heart Surgery;   Laterality: N/A;  . Left and right heart catheterization with coronary angiogram N/A 02/26/2014    Procedure: LEFT AND RIGHT HEART CATHETERIZATION WITH CORONARY ANGIOGRAM;  Surgeon: Jettie Booze, MD;  Location: [facility] CATH LAB;  Service: Cardiovascular;  Laterality: N/A;    Allergies  Allergen Reactions  . Ciprofloxacin Hives    Current Outpatient Prescriptions  Medication Sig Dispense Refill  . aspirin EC 81 MG tablet Take 81 mg by mouth daily.    . Coenzyme Q10 (COQ10) 200 MG CAPS Take 1 capsule by mouth every morning.     . donepezil (ARICEPT) 5 MG tablet Take 5 mg by mouth at bedtime.    Marland Kitchen KLOR-CON M20 20 MEQ tablet Take 20 mEq by mouth 2 (two) times daily.     . Memantine HCl ER (NAMENDA XR) 28 MG CP24 Take 1 capsule by mouth every morning.     . Phosphatidylserine-DHA-EPA (VAYACOG) 100-19.5-6.5 MG CAPS Take 1 capsule by mouth every morning.     . tamsulosin (FLOMAX) 0.4 MG CAPS capsule Take 0.4 mg by mouth daily after breakfast.     . metoprolol tartrate (LOPRESSOR) 25 MG tablet Take 25 mg by mouth 2 (two) times daily.  2   No current facility-administered medications for this visit.    REVIEW OF SYSTEMS: see HPI for pertinent positives and negatives   Physical Examination  Filed Vitals:   02/10/15 1406  BP: 122/78  Pulse: 80  Resp: 16  Height: 6' (1.829 m)  Weight: 165 lb (74.844 kg)  SpO2: 98%   Body mass index is 22.37 kg/(m^2).  General:  WDWN  Eyes: PERRLA  Pulmonary: Sym exp, good air movt, CTAB, no rales, rhonchi, & wheezing  Cardiac: RRR, Nl S1, S2, no detected murmur  Vascular: Vessel Right Left  Radial Palpable Palpable  Carotid Palpable, without bruit Palpable, without bruit  Aorta Not palpable N/A  Femoral Palpable Palpable  Popliteal Not palpable Not palpable  PT Palpable Palpable  DP Palpable Palpable   Gastrointestinal: soft, NTND, -G/R, - HSM, - palpable masses, - CVAT B, no palpable AAA  Musculoskeletal:  M/S 5/5 throughout , Extremities without ischemic changes , no palpable popliteal aneurysms  Neurologic: CN 2-12 intact, oriented x 2, Pain and light touch intact in extremities, Motor exam as listed above  Psychiatric: Follows commands appropriately, Mood & affect appropriate for pt's clinical situation.  Dermatologic: See M/S exam for extremity exam, no rashes otherwise noted           Medical Decision Making  Gracin Mcpartland is a 80 y.o. male who presents for medical evaluation prior to 2 year follow up CTA abdomen/pelvis for incidentally found splenic artery aneurysm, asymptomatic.   Based on his exam and studies, I have offered the patient CTA abdomen/pelvis in September 2016, which is 2 years after his last CTA abd/pelvis in  which splenic artery was evaluated, for re evaluation of splenic artery aneurysm, follow up with Dr. Bridgett Larsson..  Thank you for allowing [place] to participate in this patient's care.  NICKEL, Sharmon Leyden, RN, MSN, FNP-C [facility] Office: [phone]  Clinic MD: Early  02/10/2015, 2:17 PM

## 2015-02-16 ENCOUNTER — Other Ambulatory Visit: Payer: Self-pay

## 2015-05-05 ENCOUNTER — Other Ambulatory Visit: Payer: Self-pay | Admitting: Family Medicine

## 2015-05-05 DIAGNOSIS — I728 Aneurysm of other specified arteries: Secondary | ICD-10-CM

## 2015-05-28 ENCOUNTER — Other Ambulatory Visit (HOSPITAL_COMMUNITY): Payer: Self-pay | Admitting: Internal Medicine

## 2015-05-28 DIAGNOSIS — R55 Syncope and collapse: Secondary | ICD-10-CM

## 2015-05-29 ENCOUNTER — Other Ambulatory Visit (HOSPITAL_COMMUNITY): Payer: Self-pay | Admitting: Internal Medicine

## 2015-05-29 DIAGNOSIS — R55 Syncope and collapse: Secondary | ICD-10-CM

## 2015-06-01 ENCOUNTER — Ambulatory Visit (HOSPITAL_COMMUNITY)
Admission: RE | Admit: 2015-06-01 | Discharge: 2015-06-01 | Disposition: A | Discharge status: Home / Self Care | Payer: Medicare HMO | Source: Ambulatory Visit | Attending: Cardiology | Admitting: Cardiology

## 2015-06-01 DIAGNOSIS — I2510 Atherosclerotic heart disease of native coronary artery without angina pectoris: Secondary | ICD-10-CM | POA: Diagnosis not present

## 2015-06-01 DIAGNOSIS — I6523 Occlusion and stenosis of bilateral carotid arteries: Secondary | ICD-10-CM | POA: Insufficient documentation

## 2015-06-01 DIAGNOSIS — E785 Hyperlipidemia, unspecified: Secondary | ICD-10-CM | POA: Insufficient documentation

## 2015-06-01 DIAGNOSIS — R55 Syncope and collapse: Secondary | ICD-10-CM | POA: Diagnosis present

## 2015-06-01 DIAGNOSIS — I10 Essential (primary) hypertension: Secondary | ICD-10-CM | POA: Diagnosis not present

## 2015-06-03 ENCOUNTER — Ambulatory Visit
Admission: RE | Admit: 2015-06-03 | Discharge: 2015-06-03 | Disposition: A | Discharge status: Home / Self Care | Payer: Medicare HMO | Source: Ambulatory Visit | Attending: Family Medicine | Admitting: Family Medicine

## 2015-06-03 DIAGNOSIS — I728 Aneurysm of other specified arteries: Secondary | ICD-10-CM

## 2015-06-03 MED ORDER — IOPAMIDOL (ISOVUE-370) INJECTION 76%
60.0000 mL | Freq: Once | INTRAVENOUS | Status: AC | PRN
  Administered 2015-06-03: 60 mL via INTRAVENOUS

## 2015-06-05 ENCOUNTER — Ambulatory Visit (HOSPITAL_COMMUNITY): Payer: Medicare HMO | Attending: Internal Medicine

## 2015-06-05 DIAGNOSIS — I10 Essential (primary) hypertension: Secondary | ICD-10-CM | POA: Diagnosis not present

## 2015-06-05 DIAGNOSIS — R55 Syncope and collapse: Secondary | ICD-10-CM | POA: Diagnosis not present

## 2015-06-05 DIAGNOSIS — E785 Hyperlipidemia, unspecified: Secondary | ICD-10-CM | POA: Insufficient documentation

## 2015-06-05 DIAGNOSIS — Z951 Presence of aortocoronary bypass graft: Secondary | ICD-10-CM | POA: Diagnosis not present

## 2015-06-05 DIAGNOSIS — I517 Cardiomegaly: Secondary | ICD-10-CM | POA: Insufficient documentation

## 2015-06-05 DIAGNOSIS — Z952 Presence of prosthetic heart valve: Secondary | ICD-10-CM | POA: Diagnosis not present

## 2015-06-05 DIAGNOSIS — I340 Nonrheumatic mitral (valve) insufficiency: Secondary | ICD-10-CM | POA: Insufficient documentation

## 2015-06-12 IMAGING — CR DG CHEST 1V PORT
2 series · 2 of 2 positions shown · non-contrast
Comparison: 03/11/2014

CLINICAL DATA: Postop

EXAM:
PORTABLE CHEST - 1 VIEW

[portable (1 of 2)]
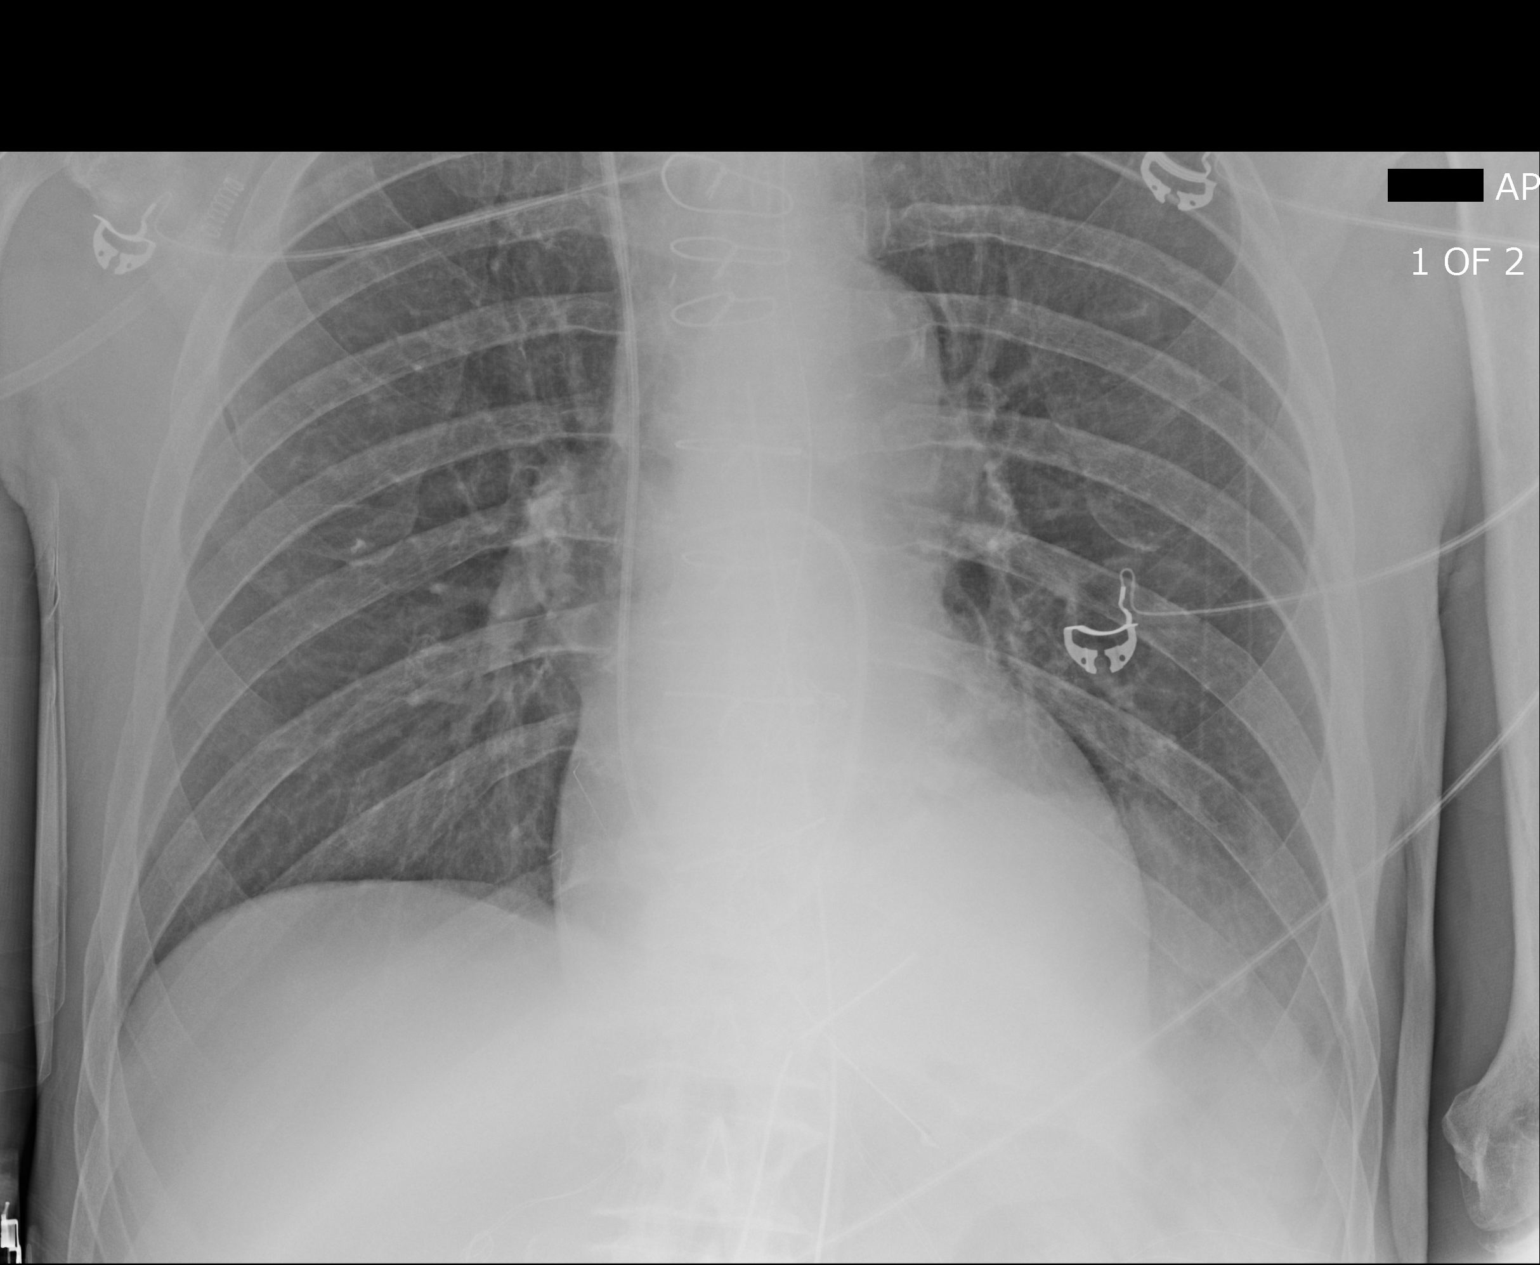

[portable (2 of 2)]
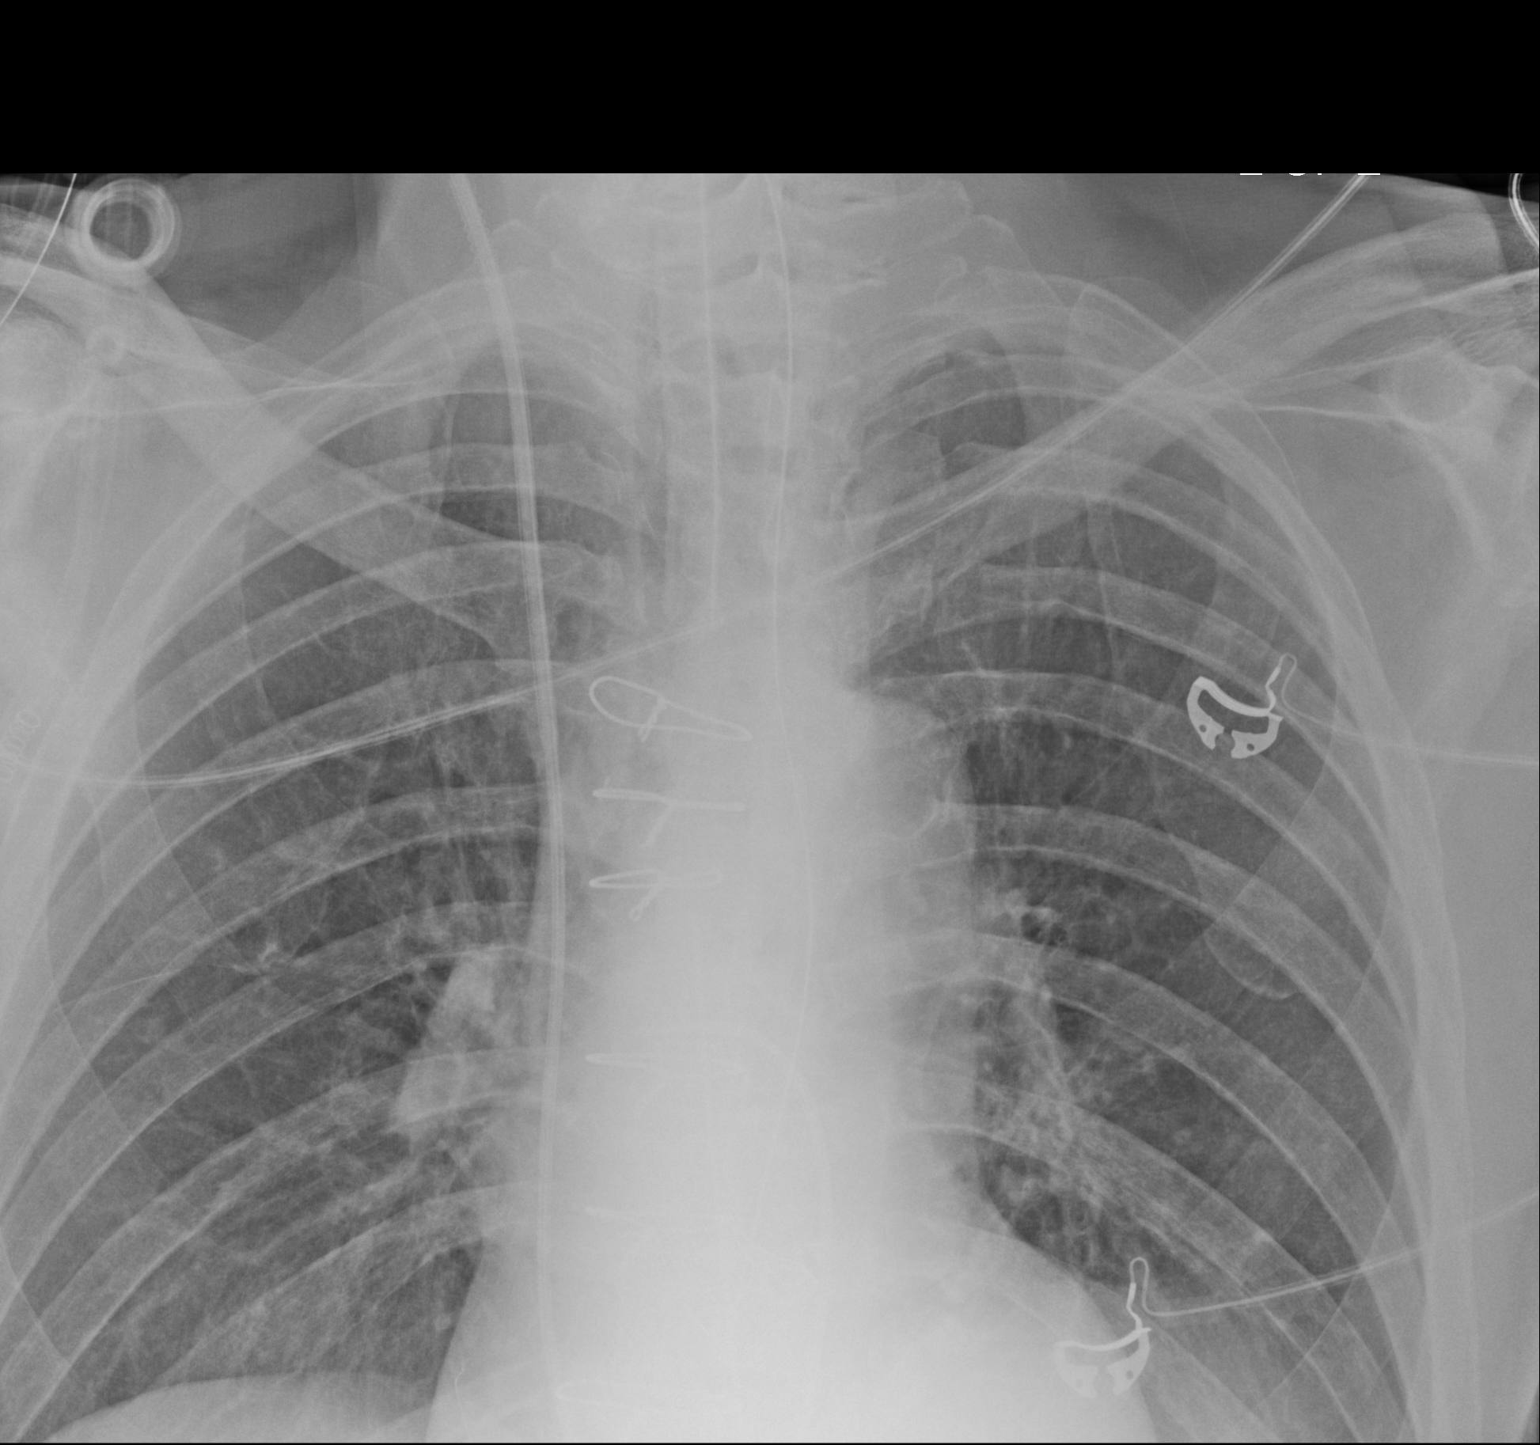

[2 of 2 positions shown; findings below may reference images not displayed]

FINDINGS: Endotracheal tube tip is 5.9 cm from the carina. NG tube tip is in
the fundus of the stomach. Right internal jugular vein Swan-Ganz
catheter and introducer are in place with its tip in the central
right pulmonary artery. Percutaneous pacemaker wires in place.
Mediastinal tubes are in place. There is no pneumothorax or
pulmonary edema. Minimal volume loss at the left base.
IMPRESSION: Support devices after CABG or appropriately positioned.

No evidence of pneumothorax or CHF.

Mild left basilar atelectasis.

## 2015-06-25 ENCOUNTER — Other Ambulatory Visit: Payer: Self-pay | Admitting: Internal Medicine

## 2015-06-25 DIAGNOSIS — R55 Syncope and collapse: Secondary | ICD-10-CM

## 2015-08-03 ENCOUNTER — Ambulatory Visit
Admission: RE | Admit: 2015-08-03 | Discharge: 2015-08-03 | Disposition: A | Discharge status: Home / Self Care | Payer: Medicare HMO | Source: Ambulatory Visit | Attending: Internal Medicine | Admitting: Internal Medicine

## 2015-08-03 DIAGNOSIS — R55 Syncope and collapse: Secondary | ICD-10-CM

## 2015-09-04 ENCOUNTER — Ambulatory Visit: Payer: Medicare HMO | Admitting: Vascular Surgery

## 2016-02-22 DIAGNOSIS — H1031 Unspecified acute conjunctivitis, right eye: Secondary | ICD-10-CM | POA: Diagnosis not present

## 2016-03-17 DIAGNOSIS — H02043 Spastic entropion of right eye, unspecified eyelid: Secondary | ICD-10-CM | POA: Diagnosis not present

## 2016-03-17 DIAGNOSIS — S0501XA Injury of conjunctiva and corneal abrasion without foreign body, right eye, initial encounter: Secondary | ICD-10-CM | POA: Diagnosis not present

## 2016-03-17 DIAGNOSIS — H109 Unspecified conjunctivitis: Secondary | ICD-10-CM | POA: Diagnosis not present

## 2016-03-17 DIAGNOSIS — H578 Other specified disorders of eye and adnexa: Secondary | ICD-10-CM | POA: Diagnosis not present

## 2016-03-17 DIAGNOSIS — R69 Illness, unspecified: Secondary | ICD-10-CM | POA: Diagnosis not present

## 2016-03-21 DIAGNOSIS — H16001 Unspecified corneal ulcer, right eye: Secondary | ICD-10-CM | POA: Diagnosis not present

## 2016-03-21 DIAGNOSIS — S0501XA Injury of conjunctiva and corneal abrasion without foreign body, right eye, initial encounter: Secondary | ICD-10-CM | POA: Diagnosis not present

## 2016-04-05 DIAGNOSIS — S0501XA Injury of conjunctiva and corneal abrasion without foreign body, right eye, initial encounter: Secondary | ICD-10-CM | POA: Diagnosis not present

## 2016-04-05 DIAGNOSIS — H02043 Spastic entropion of right eye, unspecified eyelid: Secondary | ICD-10-CM | POA: Diagnosis not present

## 2016-04-28 DIAGNOSIS — H18891 Other specified disorders of cornea, right eye: Secondary | ICD-10-CM | POA: Diagnosis not present

## 2016-04-28 DIAGNOSIS — H02833 Dermatochalasis of right eye, unspecified eyelid: Secondary | ICD-10-CM | POA: Diagnosis not present

## 2016-04-28 DIAGNOSIS — Z881 Allergy status to other antibiotic agents status: Secondary | ICD-10-CM | POA: Diagnosis not present

## 2016-04-28 DIAGNOSIS — H02002 Unspecified entropion of right lower eyelid: Secondary | ICD-10-CM | POA: Diagnosis not present

## 2016-04-28 DIAGNOSIS — H02831 Dermatochalasis of right upper eyelid: Secondary | ICD-10-CM | POA: Diagnosis not present

## 2016-04-28 DIAGNOSIS — H02834 Dermatochalasis of left upper eyelid: Secondary | ICD-10-CM | POA: Diagnosis not present

## 2016-04-28 DIAGNOSIS — Z7982 Long term (current) use of aspirin: Secondary | ICD-10-CM | POA: Diagnosis not present

## 2016-04-28 DIAGNOSIS — Z87891 Personal history of nicotine dependence: Secondary | ICD-10-CM | POA: Diagnosis not present

## 2016-04-28 DIAGNOSIS — H02003 Unspecified entropion of right eye, unspecified eyelid: Secondary | ICD-10-CM | POA: Diagnosis not present

## 2016-04-28 DIAGNOSIS — I10 Essential (primary) hypertension: Secondary | ICD-10-CM | POA: Diagnosis not present

## 2016-05-26 ENCOUNTER — Emergency Department (HOSPITAL_COMMUNITY): Payer: Medicare HMO

## 2016-05-26 ENCOUNTER — Other Ambulatory Visit (HOSPITAL_COMMUNITY): Payer: Self-pay

## 2016-05-26 ENCOUNTER — Other Ambulatory Visit: Payer: Self-pay

## 2016-05-26 ENCOUNTER — Observation Stay (HOSPITAL_COMMUNITY)
Admission: EM | Admit: 2016-05-26 | Discharge: 2016-05-28 | Disposition: A | Discharge status: Home / Self Care | Payer: Medicare HMO | Attending: Internal Medicine | Admitting: Internal Medicine

## 2016-05-26 ENCOUNTER — Encounter (HOSPITAL_COMMUNITY): Payer: Self-pay | Admitting: Emergency Medicine

## 2016-05-26 DIAGNOSIS — I130 Hypertensive heart and chronic kidney disease with heart failure and stage 1 through stage 4 chronic kidney disease, or unspecified chronic kidney disease: Secondary | ICD-10-CM | POA: Insufficient documentation

## 2016-05-26 DIAGNOSIS — Y831 Surgical operation with implant of artificial internal device as the cause of abnormal reaction of the patient, or of later complication, without mention of misadventure at the time of the procedure: Secondary | ICD-10-CM | POA: Diagnosis not present

## 2016-05-26 DIAGNOSIS — Z79899 Other long term (current) drug therapy: Secondary | ICD-10-CM | POA: Insufficient documentation

## 2016-05-26 DIAGNOSIS — N400 Enlarged prostate without lower urinary tract symptoms: Secondary | ICD-10-CM

## 2016-05-26 DIAGNOSIS — Z951 Presence of aortocoronary bypass graft: Secondary | ICD-10-CM | POA: Insufficient documentation

## 2016-05-26 DIAGNOSIS — R55 Syncope and collapse: Principal | ICD-10-CM | POA: Insufficient documentation

## 2016-05-26 DIAGNOSIS — T82857A Stenosis of cardiac prosthetic devices, implants and grafts, initial encounter: Secondary | ICD-10-CM | POA: Insufficient documentation

## 2016-05-26 DIAGNOSIS — Z952 Presence of prosthetic heart valve: Secondary | ICD-10-CM | POA: Insufficient documentation

## 2016-05-26 DIAGNOSIS — Z87891 Personal history of nicotine dependence: Secondary | ICD-10-CM | POA: Insufficient documentation

## 2016-05-26 DIAGNOSIS — I5022 Chronic systolic (congestive) heart failure: Secondary | ICD-10-CM | POA: Insufficient documentation

## 2016-05-26 DIAGNOSIS — I2510 Atherosclerotic heart disease of native coronary artery without angina pectoris: Secondary | ICD-10-CM | POA: Insufficient documentation

## 2016-05-26 DIAGNOSIS — I350 Nonrheumatic aortic (valve) stenosis: Secondary | ICD-10-CM

## 2016-05-26 DIAGNOSIS — I10 Essential (primary) hypertension: Secondary | ICD-10-CM | POA: Diagnosis present

## 2016-05-26 DIAGNOSIS — R69 Illness, unspecified: Secondary | ICD-10-CM | POA: Diagnosis not present

## 2016-05-26 DIAGNOSIS — E876 Hypokalemia: Secondary | ICD-10-CM | POA: Diagnosis not present

## 2016-05-26 DIAGNOSIS — N183 Chronic kidney disease, stage 3 unspecified: Secondary | ICD-10-CM

## 2016-05-26 DIAGNOSIS — Z7982 Long term (current) use of aspirin: Secondary | ICD-10-CM | POA: Insufficient documentation

## 2016-05-26 DIAGNOSIS — R404 Transient alteration of awareness: Secondary | ICD-10-CM | POA: Diagnosis not present

## 2016-05-26 DIAGNOSIS — F0390 Unspecified dementia without behavioral disturbance: Secondary | ICD-10-CM | POA: Diagnosis not present

## 2016-05-26 LAB — I-STAT CHEM 8, ED
BUN: 25 mg/dL — ABNORMAL HIGH (ref 6–20)
CREATININE: 1.5 mg/dL — AB (ref 0.61–1.24)
Calcium, Ion: 1.14 mmol/L — ABNORMAL LOW (ref 1.15–1.40)
Chloride: 104 mmol/L (ref 101–111)
GLUCOSE: 89 mg/dL (ref 65–99)
HCT: 38 % — ABNORMAL LOW (ref 39.0–52.0)
HEMOGLOBIN: 12.9 g/dL — AB (ref 13.0–17.0)
Potassium: 3.6 mmol/L (ref 3.5–5.1)
Sodium: 140 mmol/L (ref 135–145)
TCO2: 24 mmol/L (ref 0–100)

## 2016-05-26 LAB — COMPREHENSIVE METABOLIC PANEL
ALT: 12 U/L — AB (ref 17–63)
AST: 18 U/L (ref 15–41)
Albumin: 3.9 g/dL (ref 3.5–5.0)
Alkaline Phosphatase: 50 U/L (ref 38–126)
Anion gap: 6 (ref 5–15)
BUN: 22 mg/dL — ABNORMAL HIGH (ref 6–20)
CHLORIDE: 107 mmol/L (ref 101–111)
CO2: 25 mmol/L (ref 22–32)
CREATININE: 1.57 mg/dL — AB (ref 0.61–1.24)
Calcium: 8.6 mg/dL — ABNORMAL LOW (ref 8.9–10.3)
GFR calc non Af Amer: 40 mL/min — ABNORMAL LOW (ref >60)
GFR, EST AFRICAN AMERICAN: 47 mL/min — AB (ref >60)
Glucose, Bld: 94 mg/dL (ref 65–99)
Potassium: 3.6 mmol/L (ref 3.5–5.1)
Sodium: 138 mmol/L (ref 135–145)
Total Bilirubin: 0.8 mg/dL (ref 0.3–1.2)
Total Protein: 6.2 g/dL — ABNORMAL LOW (ref 6.5–8.1)

## 2016-05-26 LAB — CBC WITH DIFFERENTIAL/PLATELET
BASOS ABS: 0 10*3/uL (ref 0.0–0.1)
Basophils Relative: 0 %
Eosinophils Absolute: 0.2 10*3/uL (ref 0.0–0.7)
Eosinophils Relative: 2 %
HEMATOCRIT: 38.2 % — AB (ref 39.0–52.0)
HEMOGLOBIN: 12.8 g/dL — AB (ref 13.0–17.0)
LYMPHS PCT: 8 %
Lymphs Abs: 0.7 10*3/uL (ref 0.7–4.0)
MCH: 29 pg (ref 26.0–34.0)
MCHC: 33.5 g/dL (ref 30.0–36.0)
MCV: 86.6 fL (ref 78.0–100.0)
MONO ABS: 0.4 10*3/uL (ref 0.1–1.0)
MONOS PCT: 5 %
NEUTROS ABS: 7.6 10*3/uL (ref 1.7–7.7)
NEUTROS PCT: 85 %
Platelets: 132 10*3/uL — ABNORMAL LOW (ref 150–400)
RBC: 4.41 MIL/uL (ref 4.22–5.81)
RDW: 13.6 % (ref 11.5–15.5)
WBC: 8.9 10*3/uL (ref 4.0–10.5)

## 2016-05-26 LAB — I-STAT TROPONIN, ED: TROPONIN I, POC: 0.01 ng/mL (ref 0.00–0.08)

## 2016-05-26 LAB — TROPONIN I: Troponin I: <0.03 ng/mL (ref <0.03)

## 2016-05-26 LAB — TSH: TSH: 0.774 u[IU]/mL (ref 0.350–4.500)

## 2016-05-26 MED ORDER — ACETAMINOPHEN 325 MG PO TABS: 650.0000 mg | ORAL_TABLET | Freq: Four times a day (QID) | ORAL | Status: DC | PRN

## 2016-05-26 MED ORDER — PHOSPHATIDYLSERINE-DHA-EPA 100-19.5-6.5 MG PO CAPS: 1.0000 | ORAL_CAPSULE | Freq: Every morning | ORAL | Status: DC

## 2016-05-26 MED ORDER — SODIUM CHLORIDE 0.9 % IV SOLN: INTRAVENOUS | Status: DC

## 2016-05-26 MED ORDER — SODIUM CHLORIDE 0.9 % IV SOLN
INTRAVENOUS | Status: DC
  Administered 2016-05-26 – 2016-05-27 (×2): via INTRAVENOUS

## 2016-05-26 MED ORDER — SODIUM CHLORIDE 0.9 % IV BOLUS (SEPSIS)
1000.0000 mL | Freq: Once | INTRAVENOUS | Status: AC
  Administered 2016-05-26: 1000 mL via INTRAVENOUS

## 2016-05-26 MED ORDER — TAMSULOSIN HCL 0.4 MG PO CAPS
0.4000 mg | ORAL_CAPSULE | Freq: Every day | ORAL | Status: DC
  Administered 2016-05-27 – 2016-05-28 (×2): 0.4 mg via ORAL
  Filled 2016-05-26 (×2): qty 1

## 2016-05-26 MED ORDER — SODIUM CHLORIDE 0.9% FLUSH
3.0000 mL | Freq: Two times a day (BID) | INTRAVENOUS | Status: DC
  Administered 2016-05-26 – 2016-05-28 (×3): 3 mL via INTRAVENOUS

## 2016-05-26 MED ORDER — MEMANTINE HCL ER 28 MG PO CP24
28.0000 mg | ORAL_CAPSULE | Freq: Every morning | ORAL | Status: DC
  Administered 2016-05-27 – 2016-05-28 (×2): 28 mg via ORAL
  Filled 2016-05-26 (×2): qty 1

## 2016-05-26 MED ORDER — ASPIRIN EC 81 MG PO TBEC
81.0000 mg | DELAYED_RELEASE_TABLET | Freq: Every day | ORAL | Status: DC
  Administered 2016-05-26 – 2016-05-28 (×3): 81 mg via ORAL
  Filled 2016-05-26 (×3): qty 1

## 2016-05-26 MED ORDER — ENOXAPARIN SODIUM 40 MG/0.4ML ~~LOC~~ SOLN
40.0000 mg | SUBCUTANEOUS | Status: DC
  Administered 2016-05-26 – 2016-05-27 (×2): 40 mg via SUBCUTANEOUS
  Filled 2016-05-26 (×2): qty 0.4

## 2016-05-26 MED ORDER — ERYTHROMYCIN 5 MG/GM OP OINT
1.0000 | TOPICAL_OINTMENT | Freq: Four times a day (QID) | OPHTHALMIC | Status: DC
Start: 2016-05-26 — End: 2016-05-28
  Administered 2016-05-26 – 2016-05-28 (×7): 1 via OPHTHALMIC
  Filled 2016-05-26: qty 3.5

## 2016-05-26 MED ORDER — ACETAMINOPHEN 650 MG RE SUPP: 650.0000 mg | Freq: Four times a day (QID) | RECTAL | Status: DC | PRN

## 2016-05-26 NOTE — ED Provider Notes (Signed)
[name] Galloway Provider Note   CSN: [id number] Arrival date & time: 05/26/16  1505     History   Chief Complaint Chief Complaint  Patient presents with  . Loss of Consciousness    HPI [name] Galloway is a 81 y.o. male.  Patient with severe dementia, oriented to self only, per Nurses Note from EMS:   Per [facility] called out to syncope.  EMS reports patient from home found by wife seated on toilet with pants down, wife states bathroom sink had been left running and was overflowing, patient was  unresponsive for approximately 5 minutes.  History of dementia, patient is oriented to self only.  18g saline locked in right AC, 500 mL NS en route.    The history is provided by the EMS personnel and medical records.   Level V Caveat due to dementia  Past Medical History:  Diagnosis Date  . Anemia   . Atrial flutter ([place])   . Coronary artery disease   . Dementia   . H/O hiatal hernia   . Hx of echocardiogram    Echo (8/15):  Mild LVH, EF 60-65%, Gr 2 DD, AVR ok (mean 14 mmHg), MAC, mild to mod MR, mod LAE  . Hypertension    CONTROLLED SINCE WEIGHT LOSS  . Incontinence of bowel   . Incontinence of urine   . Psoriasis   . Sigmoid volvulus [facility])     Patient Active Problem List   Diagnosis Date Noted  . CKD (chronic kidney disease) stage 3, GFR 30-59 ml/min 05/26/2016  . Syncope 01/07/2015  . CRI (chronic renal insufficiency) 01/07/2015  . S/P Bioprosthetic AVR (aortic valve replacement) 04/15/2014  . Coronary atherosclerosis of native coronary artery 04/15/2014  . Atrial flutter ([place])   . Essential hypertension, benign 12/18/2013  . Mixed hyperlipidemia 12/18/2013  . Dementia 12/18/2013  . Splenic artery aneurysm ([place]) 08/30/2013  . Aortic stenosis 09/20/2011  . Large bowel obstruction 09/19/2011  . Hypokalemia 09/19/2011  . Constipation 09/19/2011  . Anemia 09/19/2011  . Lung nodules 09/19/2011    Past Surgical History:  Procedure Laterality Date  . AORTIC  VALVE REPLACEMENT N/A 03/13/2014   Procedure: AORTIC VALVE REPLACEMENT (AVR);  Surgeon: Gaye Pollack, MD;  Location: [place];  Service: Open Heart Surgery;  Laterality: N/A;  . CARDIAC CATHETERIZATION    . COLONOSCOPY  2011   Blockage  . CORONARY ARTERY BYPASS GRAFT N/A 03/13/2014   Procedure: Coronary Artery Bypass Grafting times one using right greater saphenous vein via endovein harvest.;  Surgeon: Gaye Pollack, MD;  Location: [place] OR;  Service: Open Heart Surgery;  Laterality: N/A;  . FLEXIBLE SIGMOIDOSCOPY  09/20/2011   Procedure: FLEXIBLE SIGMOIDOSCOPY;  Surgeon: Jeryl Columbia, MD;  Location: [facility] ENDOSCOPY;  Service: Endoscopy;  Laterality: N/A;  . HERNIA REPAIR    . INTRAOPERATIVE TRANSESOPHAGEAL ECHOCARDIOGRAM N/A 03/13/2014   Procedure: INTRAOPERATIVE TRANSESOPHAGEAL ECHOCARDIOGRAM;  Surgeon: Gaye Pollack, MD;  Location: [facility] OR;  Service: Open Heart Surgery;  Laterality: N/A;  . LEFT AND RIGHT HEART CATHETERIZATION WITH CORONARY ANGIOGRAM N/A 02/26/2014   Procedure: LEFT AND RIGHT HEART CATHETERIZATION WITH CORONARY ANGIOGRAM;  Surgeon: Jettie Booze, MD;  Location: [facility] CATH LAB;  Service: Cardiovascular;  Laterality: N/A;  . TONSILLECTOMY         Home Medications    Prior to Admission medications   Medication Sig Start Date End Date Taking? Authorizing Provider  aspirin EC 81 MG tablet Take 81 mg by mouth daily.   Yes  Historical Provider, MD  Coenzyme Q10 (COQ10) 100 MG CAPS Take 100 mg by mouth daily.   Yes Historical Provider, MD  erythromycin ophthalmic ointment Place 1 application into the right eye 4 (four) times daily. 04/28/16  Yes Historical Provider, MD  KLOR-CON M20 20 MEQ tablet Take 20 mEq by mouth 2 (two) times daily.  02/24/14  Yes Historical Provider, MD  Memantine HCl ER (NAMENDA XR) 28 MG CP24 Take 1 capsule by mouth every morning.    Yes Historical Provider, MD  Phosphatidylserine-DHA-EPA (VAYACOG) 100-19.5-6.5 MG CAPS Take 1 capsule by mouth every morning.    Yes  Historical Provider, MD  tamsulosin (FLOMAX) 0.4 MG CAPS capsule Take 0.4 mg by mouth daily after breakfast.  10/15/13  Yes Historical Provider, MD    Family History Family History  Problem Relation Age of Onset  . Cancer Mother   . Hypertension Mother   . Cancer Father   . Cancer Sister   . Heart attack Son   . Heart disease Son     before age 35    Social History Social History  Substance Use Topics  . Smoking status: Former Smoker    Packs/day: 0.50    Years: 10.00    Quit date: 09/20/1971  . Smokeless tobacco: Never Used  . Alcohol use No     Allergies   Ciprofloxacin   Review of Systems Review of Systems  Constitutional: Negative for fatigue and fever.  Respiratory: Negative for shortness of breath.   Cardiovascular: Negative for chest pain.  Gastrointestinal: Negative for nausea and vomiting.  Endocrine: Negative for polydipsia and polyuria.  Genitourinary: Negative for dysuria and flank pain.  Musculoskeletal: Negative for back pain.  Neurological: Positive for headaches.  All other systems reviewed and are negative.    Physical Exam Updated Vital Signs BP 111/59   Pulse (!) 43   Temp 97.7 F (36.5 C) (Oral)   Resp 20   SpO2 91%   Physical Exam  Constitutional: He appears well-developed and well-nourished.  HENT:  Head: Normocephalic and atraumatic.  Eyes: Conjunctivae are normal.  Neck: Neck supple.  Cardiovascular: Normal rate and regular rhythm.   No murmur heard. Pulmonary/Chest: Effort normal and breath sounds normal. No respiratory distress.  Abdominal: Soft. There is no tenderness.  Musculoskeletal: He exhibits no edema.  Neurological: He is alert.  No altered mental status, able to give full seemingly accurate history.  Face is symmetric, EOM's intact, pupils equal and reactive, vision intact, tongue and uvula midline without deviation Upper and Lower extremity motor 5/5, intact pain perception in distal extremities, 2+ reflexes in  biceps, patella and achilles tendons.  Skin: Skin is warm and dry.  Psychiatric: He has a normal mood and affect.  Nursing note and vitals reviewed.    ED Treatments / Results  Labs (all labs ordered are listed, but only abnormal results are displayed) Labs Reviewed  CBC WITH DIFFERENTIAL/PLATELET - Abnormal; Notable for the following:       Result Value   Hemoglobin 12.8 (*)    HCT 38.2 (*)    Platelets 132 (*)    All other components within normal limits  COMPREHENSIVE METABOLIC PANEL - Abnormal; Notable for the following:    BUN 22 (*)    Creatinine, Ser 1.57 (*)    Calcium 8.6 (*)    Total Protein 6.2 (*)    ALT 12 (*)    GFR calc non Af Amer 40 (*)    GFR calc Af Wyvonnia Lora  47 (*)    All other components within normal limits  I-STAT CHEM 8, ED - Abnormal; Notable for the following:    BUN 25 (*)    Creatinine, Ser 1.50 (*)    Calcium, Ion 1.14 (*)    Hemoglobin 12.9 (*)    HCT 38.0 (*)    All other components within normal limits  URINALYSIS, ROUTINE W REFLEX MICROSCOPIC (NOT AT [facility]  POCT CBG (FASTING - GLUCOSE)-MANUAL ENTRY  I-STAT TROPOININ, ED    EKG  EKG Interpretation  Date/Time:  Thursday May 26 2016 16:02:20 EDT Ventricular Rate:  58 PR Interval:    QRS Duration: 153 QT Interval:  497 QTC Calculation: 489 R Axis:   30 Text Interpretation:  Age not entered, assumed to be  81 years old for purpose of ECG interpretation Sinus rhythm Probable left atrial enlargement Left bundle branch block No significant change since last tracing Confirmed by [facility] MD, Corene Cornea [phone]) on 05/26/2016 4:30:25 PM       Radiology Dg Chest 2 View  Result Date: 05/26/2016 CLINICAL DATA:  81 y/o  M; syncope. EXAM: CHEST  2 VIEW COMPARISON:  07/24/2014 chest radiograph.  04/29/2013 CT of chest. FINDINGS: Stable cardiac silhouette within normal limits. Median sternotomy wires are intact. Aortic valve replacement. Clear lungs. No pneumothorax or pleural effusion. No acute osseous  abnormality is evident. Prominent loop of transverse colon transposition over liver and stomach in the upper abdomen as seen on prior CT. IMPRESSION: No active cardiopulmonary disease. Electronically Signed   By: Kristine Garbe M.D.   On: 05/26/2016 16:23   Ct Head Wo Contrast  Result Date: 05/26/2016 CLINICAL DATA:  Syncope.  Altered mental status. EXAM: CT HEAD WITHOUT CONTRAST TECHNIQUE: Contiguous axial images were obtained from the base of the skull through the vertex without intravenous contrast. COMPARISON:  08/03/2015 brain MRI. FINDINGS: Brain: No evidence of parenchymal hemorrhage or extra-axial fluid collection. No mass lesion, mass effect, or midline shift. No CT evidence of acute infarction. Intracranial atherosclerosis. Generalized cerebral volume loss. Nonspecific mild subcortical and periventricular white matter hypodensity, most in keeping with chronic small vessel ischemic change. Cerebral ventricle sizes are stable and concordant with the degree of cerebral volume loss. Vascular: No hyperdense vessel or unexpected calcification. Skull: No evidence of calvarial fracture. Sinuses/Orbits: The visualized paranasal sinuses are essentially clear. Other:  The mastoid air cells are unopacified. IMPRESSION: 1.  No evidence of acute intracranial abnormality. 2. Generalized cerebral volume loss and mild chronic small vessel ischemia. Electronically Signed   By: Ilona Sorrel M.D.   On: 05/26/2016 16:41    Procedures Procedures (including critical care time)  Medications Ordered in ED Medications  sodium chloride 0.9 % bolus 1,000 mL (1,000 mLs Intravenous New Bag/Given 05/26/16 1559)    And  0.9 %  sodium chloride infusion (not administered)     Initial Impression / Assessment and Plan / ED Course  I have reviewed the triage vital signs and the nursing notes.  Pertinent labs & imaging results that were available during my care of the patient were reviewed by me and considered in  my medical decision making (see chart for details).  Clinical Course   Syncope, demented so poor historian, has no idea he passed out. Plan for ed w/u and likely admission 2/2 h/o CAD, age and uncertainty of surrounding events.   Final Clinical Impressions(s) / ED Diagnoses   Final diagnoses:  Syncope, unspecified syncope type    New Prescriptions New Prescriptions  No medications on file     Merrily Pew, MD 05/26/16 [phone]

## 2016-05-26 NOTE — ED Triage Notes (Signed)
Per [facility] called out to syncope.  EMS reports patient from home found by wife seated on toilet with pants down, wife states bathroom sink had been left running and was overflowing, patient was  unresponsive for approximately 5 minutes.  History of dementia, patient is oriented to self only.  18g saline locked in right AC, 500 mL NS en route.  Patient in no apparent distress at this time.

## 2016-05-26 NOTE — ED Triage Notes (Signed)
Patient is alone and is unable to answer questions regarding medical history and allergies.

## 2016-05-26 NOTE — ED Notes (Signed)
RN on floor states she will call when they have a bed for the patient

## 2016-05-26 NOTE — H&P (Signed)
History and Physical    [name] Galloway [id number] DOB: 04-26-1935 DOA: 05/26/2016  PCP: Jani Gravel, MD  Patient coming from: Home  Chief Complaint: Syncope  HPI: [name] Galloway is a 81 y.o. male with medical history significant of aortic valve replacement, CAD s/p CABG, hypertension, dementia. Patient is not able to provide a history secondary to not remembering what happened. History mainly provided by patient's wife.  Around 1:30pm this afternoon, the patient was found to have turned on the water in the sink to the point of overflowing. His wife then tried to pull his pants down to help him urinate, however, he went limp and fell. He had loss of consciousness for about 5 minutes. There was no slowing to baseline mental status upon arousal. He did not report chest pain, dyspnea or palpitations to his wife before the episode. No loss of urine or bowel. No abnormal movements  ED Course: Vitals: Normotensive. Afebrile. Labs: Creatinine stable at 1.50. Burn slightly up but stable at 25. Hemoglobin of 12.8 and stable. Troponin negative Imaging: Chest x-ray significant for no active disease. CT head significant for generalized cerebral volume loss and mild chronic small vessel ischemia. Medications/Course: 1L NS bolus given  Review of Systems: Review of Systems  Constitutional: Negative for chills, fever and weight loss.  Respiratory: Negative for cough, sputum production, shortness of breath and wheezing.   Cardiovascular: Negative for chest pain, palpitations, orthopnea and leg swelling.  Gastrointestinal: Positive for diarrhea. Negative for abdominal pain, blood in stool, constipation, nausea and vomiting.  Neurological: Positive for tremors. Negative for dizziness.  All other systems reviewed and are negative.   Past Medical History:  Diagnosis Date  . Anemia   . Atrial flutter ([place])   . Coronary artery disease   . Dementia   . H/O hiatal hernia   . Hx of echocardiogram    Echo (8/15):  Mild LVH, EF 60-65%, Gr 2 DD, AVR ok (mean 14 mmHg), MAC, mild to mod MR, mod LAE  . Hypertension    CONTROLLED SINCE WEIGHT LOSS  . Incontinence of bowel   . Incontinence of urine   . Psoriasis   . Sigmoid volvulus [facility])     Past Surgical History:  Procedure Laterality Date  . AORTIC VALVE REPLACEMENT N/A 03/13/2014   Procedure: AORTIC VALVE REPLACEMENT (AVR);  Surgeon: Gaye Pollack, MD;  Location: [place];  Service: Open Heart Surgery;  Laterality: N/A;  . CARDIAC CATHETERIZATION    . COLONOSCOPY  2011   Blockage  . CORONARY ARTERY BYPASS GRAFT N/A 03/13/2014   Procedure: Coronary Artery Bypass Grafting times one using right greater saphenous vein via endovein harvest.;  Surgeon: Gaye Pollack, MD;  Location: [place] OR;  Service: Open Heart Surgery;  Laterality: N/A;  . FLEXIBLE SIGMOIDOSCOPY  09/20/2011   Procedure: FLEXIBLE SIGMOIDOSCOPY;  Surgeon: Jeryl Columbia, MD;  Location: [facility] ENDOSCOPY;  Service: Endoscopy;  Laterality: N/A;  . HERNIA REPAIR    . INTRAOPERATIVE TRANSESOPHAGEAL ECHOCARDIOGRAM N/A 03/13/2014   Procedure: INTRAOPERATIVE TRANSESOPHAGEAL ECHOCARDIOGRAM;  Surgeon: Gaye Pollack, MD;  Location: [facility] OR;  Service: Open Heart Surgery;  Laterality: N/A;  . LEFT AND RIGHT HEART CATHETERIZATION WITH CORONARY ANGIOGRAM N/A 02/26/2014   Procedure: LEFT AND RIGHT HEART CATHETERIZATION WITH CORONARY ANGIOGRAM;  Surgeon: Jettie Booze, MD;  Location: [facility] CATH LAB;  Service: Cardiovascular;  Laterality: N/A;  . TONSILLECTOMY       reports that he quit smoking about 44 years ago. He has a 5.00 pack-year  smoking history. He has never used smokeless tobacco. He reports that he does not drink alcohol or use drugs.  Allergies  Allergen Reactions  . Ciprofloxacin Hives and Rash    Family History  Problem Relation Age of Onset  . Cancer Mother   . Hypertension Mother   . Cancer Father   . Cancer Sister   . Heart attack Son   . Heart disease Son     before age 5     Prior to Admission medications   Medication Sig Start Date End Date Taking? Authorizing Provider  aspirin EC 81 MG tablet Take 81 mg by mouth daily.   Yes Historical Provider, MD  Coenzyme Q10 (COQ10) 100 MG CAPS Take 100 mg by mouth daily.   Yes Historical Provider, MD  erythromycin ophthalmic ointment Place 1 application into the right eye 4 (four) times daily. 04/28/16  Yes Historical Provider, MD  KLOR-CON M20 20 MEQ tablet Take 20 mEq by mouth 2 (two) times daily.  02/24/14  Yes Historical Provider, MD  Memantine HCl ER (NAMENDA XR) 28 MG CP24 Take 1 capsule by mouth every morning.    Yes Historical Provider, MD  Phosphatidylserine-DHA-EPA (VAYACOG) 100-19.5-6.5 MG CAPS Take 1 capsule by mouth every morning.    Yes Historical Provider, MD  tamsulosin (FLOMAX) 0.4 MG CAPS capsule Take 0.4 mg by mouth daily after breakfast.  10/15/13  Yes Historical Provider, MD    Physical Exam: Vitals:   05/26/16 1516 05/26/16 1600 05/26/16 1724  BP: 123/67 115/72 111/59  Pulse: 65 66 (!) 43  Resp: 18 22 20   Temp: 97.7 F (36.5 C)    TempSrc: Oral    SpO2: 95% 100% 91%     Constitutional: NAD, calm, comfortable, bitemporal wasting Eyes: PERRL, lids and conjunctivae normal, orbital fat loss ENMT: Mucous membranes are moist. Posterior pharynx clear of any exudate or lesions. Neck: normal, supple, no masses, no thyromegaly Respiratory: clear to auscultation bilaterally, no wheezing, no crackles. Normal respiratory effort. No accessory muscle use.  Cardiovascular: Regular rate and rhythm, 1/6 systolic murmur. No extremity edema. 2+ pedal pulses. No carotid bruits.  Abdomen: no tenderness. Easily reducible abdominal hearnia. No hepatosplenomegaly. Bowel sounds positive.  Musculoskeletal: no clubbing / cyanosis. No joint deformity upper and lower extremities. Good ROM, no contractures. Normal muscle tone.  Skin: no rashes, lesions, ulcers. No induration Neurologic: CN 2-12 grossly intact. Sensation  intact, DTR normal. Strength 4/5 in all 4. Tremor Psychiatric: Normal judgment and insight. Alert and oriented x 1. Flat affect  Labs on Admission: I have personally reviewed following labs and imaging studies  CBC:  Recent Labs Lab 05/26/16 1645 05/26/16 1658  WBC 8.9  --   NEUTROABS 7.6  --   HGB 12.8* 12.9*  HCT 38.2* 38.0*  MCV 86.6  --   PLT 132*  --    Basic Metabolic Panel:  Recent Labs Lab 05/26/16 1645 05/26/16 1658  NA 138 140  K 3.6 3.6  CL 107 104  CO2 25  --   GLUCOSE 94 89  BUN 22* 25*  CREATININE 1.57* 1.50*  CALCIUM 8.6*  --    GFR: CrCl cannot be calculated (Unknown ideal weight.). Liver Function Tests:  Recent Labs Lab 05/26/16 1645  AST 18  ALT 12*  ALKPHOS 50  BILITOT 0.8  PROT 6.2*  ALBUMIN 3.9   No results for input(s): LIPASE, AMYLASE in the last 168 hours. No results for input(s): AMMONIA in the last 168 hours. Coagulation Profile: No  results for input(s): INR, PROTIME in the last 168 hours. Cardiac Enzymes: No results for input(s): CKTOTAL, CKMB, CKMBINDEX, TROPONINI in the last 168 hours. BNP (last 3 results) No results for input(s): PROBNP in the last 8760 hours. HbA1C: No results for input(s): HGBA1C in the last 72 hours. CBG: No results for input(s): GLUCAP in the last 168 hours. Lipid Profile: No results for input(s): CHOL, HDL, LDLCALC, TRIG, CHOLHDL, LDLDIRECT in the last 72 hours. Thyroid Function Tests: No results for input(s): TSH, T4TOTAL, FREET4, T3FREE, THYROIDAB in the last 72 hours. Anemia Panel: No results for input(s): VITAMINB12, FOLATE, FERRITIN, TIBC, IRON, RETICCTPCT in the last 72 hours. Urine analysis:    Component Value Date/Time   COLORURINE YELLOW 03/13/2014 0710   APPEARANCEUR CLOUDY (A) 03/13/2014 0710   LABSPEC 1.020 03/13/2014 0710   PHURINE 5.0 03/13/2014 0710   GLUCOSEU NEGATIVE 03/13/2014 0710   HGBUR NEGATIVE 03/13/2014 0710   BILIRUBINUR NEGATIVE 03/13/2014 0710   KETONESUR NEGATIVE  03/13/2014 0710   PROTEINUR NEGATIVE 03/13/2014 0710   UROBILINOGEN 0.2 03/13/2014 0710   NITRITE NEGATIVE 03/13/2014 0710   LEUKOCYTESUR NEGATIVE 03/13/2014 0710   Radiological Exams on Admission: Dg Chest 2 View  Result Date: 05/26/2016 CLINICAL DATA:  81 y/o  M; syncope. EXAM: CHEST  2 VIEW COMPARISON:  07/24/2014 chest radiograph.  04/29/2013 CT of chest. FINDINGS: Stable cardiac silhouette within normal limits. Median sternotomy wires are intact. Aortic valve replacement. Clear lungs. No pneumothorax or pleural effusion. No acute osseous abnormality is evident. Prominent loop of transverse colon transposition over liver and stomach in the upper abdomen as seen on prior CT. IMPRESSION: No active cardiopulmonary disease. Electronically Signed   By: Kristine Garbe M.D.   On: 05/26/2016 16:23   Ct Head Wo Contrast  Result Date: 05/26/2016 CLINICAL DATA:  Syncope.  Altered mental status. EXAM: CT HEAD WITHOUT CONTRAST TECHNIQUE: Contiguous axial images were obtained from the base of the skull through the vertex without intravenous contrast. COMPARISON:  08/03/2015 brain MRI. FINDINGS: Brain: No evidence of parenchymal hemorrhage or extra-axial fluid collection. No mass lesion, mass effect, or midline shift. No CT evidence of acute infarction. Intracranial atherosclerosis. Generalized cerebral volume loss. Nonspecific mild subcortical and periventricular white matter hypodensity, most in keeping with chronic small vessel ischemic change. Cerebral ventricle sizes are stable and concordant with the degree of cerebral volume loss. Vascular: No hyperdense vessel or unexpected calcification. Skull: No evidence of calvarial fracture. Sinuses/Orbits: The visualized paranasal sinuses are essentially clear. Other:  The mastoid air cells are unopacified. IMPRESSION: 1.  No evidence of acute intracranial abnormality. 2. Generalized cerebral volume loss and mild chronic small vessel ischemia.  Electronically Signed   By: Ilona Sorrel M.D.   On: 05/26/2016 16:41    EKG: Independently reviewed. LBBB. Normal sinus. Prolonged QTc  Assessment/Plan Principal Problem:   Syncope Active Problems:   Aortic stenosis   Essential hypertension, benign   Dementia   S/P Bioprosthetic AVR (aortic valve replacement)   Coronary atherosclerosis of native coronary artery   CKD (chronic kidney disease) stage 3, GFR 30-59 ml/min  Syncope Unknown etiology. Does not appear patient is having an ACS event. Very poor historian. ED workup negative so far. He has an extensive cardiac history. ?Seizure, although history does not support. -observation, telemetry -cardiology consult placed (message sent) -trend troponin -repeat EKG in AM -echocardiogram -TSH -physical therapy  Aortic stenosis s/p bioprosthetic valve replacement Possibly contributing to syncope, although wife states this has been worked up by cardiology in  the past.  Dementia -continue memantine and vayacog  BPH -continue tamsulosin  CKD 3 Stable  CAD -continue aspirin -taken off of statin as outpatient  DVT prophylaxis: Lovenox Code Status: Full code Family Communication: Wife at bedside Disposition Plan: Likely discharge home in AM Consults called: Cardiology (email sent) Admission status: Observation, telemetry   Cordelia Poche, MD [facility] Pager [phone]  If 7PM-7AM, please contact night-coverage [email or web address] Password [email or web address]  05/26/2016, 6:09 PM

## 2016-05-26 NOTE — ED Notes (Signed)
Pt found sitting on a stool having a bowel movement. Pt removed all monitoring leads and IV. EMT attempted to assist pt to bed, pt became combative and frustrated. Easy to redirect. Pt cleaned of stool assisted back to bed. Pt pleasant at present. IV placed again

## 2016-05-27 ENCOUNTER — Observation Stay (HOSPITAL_BASED_OUTPATIENT_CLINIC_OR_DEPARTMENT_OTHER): Payer: Medicare HMO

## 2016-05-27 DIAGNOSIS — R69 Illness, unspecified: Secondary | ICD-10-CM | POA: Diagnosis not present

## 2016-05-27 DIAGNOSIS — I5022 Chronic systolic (congestive) heart failure: Secondary | ICD-10-CM

## 2016-05-27 DIAGNOSIS — E876 Hypokalemia: Secondary | ICD-10-CM | POA: Diagnosis not present

## 2016-05-27 DIAGNOSIS — I10 Essential (primary) hypertension: Secondary | ICD-10-CM | POA: Diagnosis not present

## 2016-05-27 DIAGNOSIS — Z952 Presence of prosthetic heart valve: Secondary | ICD-10-CM | POA: Diagnosis not present

## 2016-05-27 DIAGNOSIS — R55 Syncope and collapse: Secondary | ICD-10-CM | POA: Diagnosis not present

## 2016-05-27 DIAGNOSIS — N400 Enlarged prostate without lower urinary tract symptoms: Secondary | ICD-10-CM

## 2016-05-27 DIAGNOSIS — I350 Nonrheumatic aortic (valve) stenosis: Secondary | ICD-10-CM | POA: Diagnosis not present

## 2016-05-27 DIAGNOSIS — I130 Hypertensive heart and chronic kidney disease with heart failure and stage 1 through stage 4 chronic kidney disease, or unspecified chronic kidney disease: Secondary | ICD-10-CM | POA: Diagnosis not present

## 2016-05-27 DIAGNOSIS — I951 Orthostatic hypotension: Secondary | ICD-10-CM

## 2016-05-27 DIAGNOSIS — T82857A Stenosis of cardiac prosthetic devices, implants and grafts, initial encounter: Secondary | ICD-10-CM | POA: Diagnosis not present

## 2016-05-27 DIAGNOSIS — N183 Chronic kidney disease, stage 3 (moderate): Secondary | ICD-10-CM | POA: Diagnosis not present

## 2016-05-27 DIAGNOSIS — I2510 Atherosclerotic heart disease of native coronary artery without angina pectoris: Secondary | ICD-10-CM | POA: Diagnosis not present

## 2016-05-27 LAB — CBC
HEMATOCRIT: 37.6 % — AB (ref 39.0–52.0)
HEMOGLOBIN: 12.3 g/dL — AB (ref 13.0–17.0)
MCH: 28.5 pg (ref 26.0–34.0)
MCHC: 32.7 g/dL (ref 30.0–36.0)
MCV: 87 fL (ref 78.0–100.0)
Platelets: 127 10*3/uL — ABNORMAL LOW (ref 150–400)
RBC: 4.32 MIL/uL (ref 4.22–5.81)
RDW: 13.5 % (ref 11.5–15.5)
WBC: 6.2 10*3/uL (ref 4.0–10.5)

## 2016-05-27 LAB — GLUCOSE, CAPILLARY: GLUCOSE-CAPILLARY: 76 mg/dL (ref 65–99)

## 2016-05-27 LAB — BASIC METABOLIC PANEL
Anion gap: 8 (ref 5–15)
BUN: 23 mg/dL — AB (ref 6–20)
CHLORIDE: 109 mmol/L (ref 101–111)
CO2: 20 mmol/L — AB (ref 22–32)
CREATININE: 1.42 mg/dL — AB (ref 0.61–1.24)
Calcium: 8.4 mg/dL — ABNORMAL LOW (ref 8.9–10.3)
GFR calc Af Amer: 53 mL/min — ABNORMAL LOW (ref >60)
GFR calc non Af Amer: 45 mL/min — ABNORMAL LOW (ref >60)
GLUCOSE: 82 mg/dL (ref 65–99)
Potassium: 3.3 mmol/L — ABNORMAL LOW (ref 3.5–5.1)
SODIUM: 137 mmol/L (ref 135–145)

## 2016-05-27 LAB — ECHOCARDIOGRAM COMPLETE
HEIGHTINCHES: 72 in
WEIGHTICAEL: 2465.6 [oz_av]

## 2016-05-27 LAB — TROPONIN I
Troponin I: <0.03 ng/mL (ref <0.03)
Troponin I: <0.03 ng/mL (ref <0.03)

## 2016-05-27 MED ORDER — POTASSIUM CHLORIDE CRYS ER 20 MEQ PO TBCR
40.0000 meq | EXTENDED_RELEASE_TABLET | Freq: Once | ORAL | Status: AC
  Administered 2016-05-27: 40 meq via ORAL
  Filled 2016-05-27: qty 2

## 2016-05-27 MED ORDER — ENSURE ENLIVE PO LIQD
237.0000 mL | Freq: Two times a day (BID) | ORAL | Status: DC
  Administered 2016-05-27 – 2016-05-28 (×3): 237 mL via ORAL

## 2016-05-27 NOTE — Progress Notes (Signed)
PROGRESS NOTE    [name] Galloway  [id number] DOB: 1934/12/11 DOA: 05/26/2016  PCP: Jani Gravel, MD   Brief Narrative:  [name] Galloway is a 81 y.o. male with medical history significant of aortic valve replacement, CAD s/p CABG, hypertension, dementia. Patient is not able to provide a history secondary to not remembering what happened. History mainly provided by patient's wife.  Around 1:30pm this afternoon, the patient was found to have turned on the water in the sink to the point of overflowing. His wife then tried to pull his pants down to help him urinate, however, he went limp and fell. He had loss of consciousness for about 5 minutes. There was no slowing to baseline mental status upon arousal. He did not report chest pain, dyspnea or palpitations to his wife before the episode. No loss of urine or bowel. No abnormal movements  Subjective: No complaints. Confused.   Assessment & Plan:   Principal Problem:   Syncope- likely orthostatic - + orthostatics on evening of admission- now normal after IVF bolus and continuous IVF which I will d/c now as EF is poor - also on Flomax which I will continue  - ECHO shows mod AS and new drop in EF - head CT unremarkable, TSH normal  Active Problems: New finding of sCHF- chronic - will add low dose Coreg and Lisinopril and follow BP - cardiology consult      Aortic stenosis s/p bioprosthetic valve replacement - moderate per ECHO    Dementia - oriented only to self    S/P Bioprosthetic AVR (aortic valve replacement)    Coronary atherosclerosis of native coronary artery -Aspirin  Hypokalemia - replace and follow- on daily K replacement at home    CKD (chronic kidney disease) stage 3, GFR 30-59 ml/min  BPH - Tamsulosin   DVT prophylaxis: Lovenox Code Status: Full code Family Communication:  Disposition Plan: home tomorrow Consultants:   cardiology Procedures:  2 D ECHO Left ventricle: Inferobasal and septal  hypokinesis Wall thickness   was increased in a pattern of mild LVH. Systolic function was   mildly to moderately reduced. The estimated ejection fraction was   in the range of 40% to 45%. Doppler parameters are consistent   with abnormal left ventricular relaxation (grade 1 diastolic   dysfunction). - Aortic valve: There was moderate stenosis. - Mitral valve: Severely calcified annulus. - Left atrium: The atrium was severely dilated. - Atrial septum: No defect or patent foramen ovale was identified.   Antimicrobials:  Anti-infectives    None       Objective: Vitals:   05/26/16 2100 05/27/16 0437 05/27/16 0839 05/27/16 1248  BP: (!) 119/53 (!) 138/51 121/71 138/72  Pulse: (!) 122 70  78  Resp: 18 18 16 18   Temp: 98 F (36.7 C) 98 F (36.7 C)  97.8 F (36.6 C)  TempSrc: Oral Oral  Oral  SpO2: 93% 97% 95% 95%  Weight: 69.4 kg (152 lb 14.4 oz) 69.9 kg (154 lb 1.6 oz)    Height: 6' (1.829 m)       Intake/Output Summary (Last 24 hours) at 05/27/16 1515 Last data filed at 05/27/16 1400  Gross per 24 hour  Intake              720 ml  Output                0 ml  Net              720 ml  Filed Weights   05/26/16 2100 05/27/16 0437  Weight: 69.4 kg (152 lb 14.4 oz) 69.9 kg (154 lb 1.6 oz)    Examination: General exam: Appears comfortable  HEENT: PERRLA, oral mucosa moist, no sclera icterus or thrush Respiratory system: Clear to auscultation. Respiratory effort normal. Cardiovascular system: S1 & S2 heard, RRR.  No murmurs  Gastrointestinal system: Abdomen soft, non-tender, nondistended. Normal bowel sound. No organomegaly Central nervous system: oriented only to person,  No focal neurological deficits. Extremities: No cyanosis, clubbing or edema Skin: No rashes or ulcers Psychiatry:  Mood & affect appropriate.     Data Reviewed: I have personally reviewed following labs and imaging studies  CBC:  Recent Labs Lab 05/26/16 1645 05/26/16 1658 05/27/16 0951    WBC 8.9  --  6.2  NEUTROABS 7.6  --   --   HGB 12.8* 12.9* 12.3*  HCT 38.2* 38.0* 37.6*  MCV 86.6  --  87.0  PLT 132*  --  [id number]   Basic Metabolic Panel:  Recent Labs Lab 05/26/16 1645 05/26/16 1658 05/27/16 0951  NA 138 140 137  K 3.6 3.6 3.3*  CL 107 104 109  CO2 25  --  20*  GLUCOSE 94 89 82  BUN 22* 25* 23*  CREATININE 1.57* 1.50* 1.42*  CALCIUM 8.6*  --  8.4*   GFR: Estimated Creatinine Clearance: 41.7 mL/min (by C-G formula based on SCr of 1.42 mg/dL (H)). Liver Function Tests:  Recent Labs Lab 05/26/16 1645  AST 18  ALT 12*  ALKPHOS 50  BILITOT 0.8  PROT 6.2*  ALBUMIN 3.9   No results for input(s): LIPASE, AMYLASE in the last 168 hours. No results for input(s): AMMONIA in the last 168 hours. Coagulation Profile: No results for input(s): INR, PROTIME in the last 168 hours. Cardiac Enzymes:  Recent Labs Lab 05/26/16 2045 05/27/16 0154 05/27/16 0951  TROPONINI <0.03 <0.03 <0.03   BNP (last 3 results) No results for input(s): PROBNP in the last 8760 hours. HbA1C: No results for input(s): HGBA1C in the last 72 hours. CBG:  Recent Labs Lab 05/27/16 0658  GLUCAP 76   Lipid Profile: No results for input(s): CHOL, HDL, LDLCALC, TRIG, CHOLHDL, LDLDIRECT in the last 72 hours. Thyroid Function Tests:  Recent Labs  05/26/16 2045  TSH 0.774   Anemia Panel: No results for input(s): VITAMINB12, FOLATE, FERRITIN, TIBC, IRON, RETICCTPCT in the last 72 hours. Urine analysis:    Component Value Date/Time   COLORURINE YELLOW 03/13/2014 0710   APPEARANCEUR CLOUDY (A) 03/13/2014 0710   LABSPEC 1.020 03/13/2014 0710   PHURINE 5.0 03/13/2014 0710   GLUCOSEU NEGATIVE 03/13/2014 0710   HGBUR NEGATIVE 03/13/2014 0710   BILIRUBINUR NEGATIVE 03/13/2014 0710   KETONESUR NEGATIVE 03/13/2014 0710   PROTEINUR NEGATIVE 03/13/2014 0710   UROBILINOGEN 0.2 03/13/2014 0710   NITRITE NEGATIVE 03/13/2014 0710   LEUKOCYTESUR NEGATIVE 03/13/2014 0710   Sepsis  Labs: @LABRCNTIP (procalcitonin:4,lacticidven:4) )No results found for this or any previous visit (from the past 240 hour(s)).       Radiology Studies: Dg Chest 2 View  Result Date: 05/26/2016 CLINICAL DATA:  81 y/o  M; syncope. EXAM: CHEST  2 VIEW COMPARISON:  07/24/2014 chest radiograph.  04/29/2013 CT of chest. FINDINGS: Stable cardiac silhouette within normal limits. Median sternotomy wires are intact. Aortic valve replacement. Clear lungs. No pneumothorax or pleural effusion. No acute osseous abnormality is evident. Prominent loop of transverse colon transposition over liver and stomach in the upper abdomen as seen on prior CT.  IMPRESSION: No active cardiopulmonary disease. Electronically Signed   By: Kristine Garbe M.D.   On: 05/26/2016 16:23   Ct Head Wo Contrast  Result Date: 05/26/2016 CLINICAL DATA:  Syncope.  Altered mental status. EXAM: CT HEAD WITHOUT CONTRAST TECHNIQUE: Contiguous axial images were obtained from the base of the skull through the vertex without intravenous contrast. COMPARISON:  08/03/2015 brain MRI. FINDINGS: Brain: No evidence of parenchymal hemorrhage or extra-axial fluid collection. No mass lesion, mass effect, or midline shift. No CT evidence of acute infarction. Intracranial atherosclerosis. Generalized cerebral volume loss. Nonspecific mild subcortical and periventricular white matter hypodensity, most in keeping with chronic small vessel ischemic change. Cerebral ventricle sizes are stable and concordant with the degree of cerebral volume loss. Vascular: No hyperdense vessel or unexpected calcification. Skull: No evidence of calvarial fracture. Sinuses/Orbits: The visualized paranasal sinuses are essentially clear. Other:  The mastoid air cells are unopacified. IMPRESSION: 1.  No evidence of acute intracranial abnormality. 2. Generalized cerebral volume loss and mild chronic small vessel ischemia. Electronically Signed   By: Ilona Sorrel M.D.   On:  05/26/2016 16:41      Scheduled Meds: . aspirin EC  81 mg Oral Daily  . enoxaparin (LOVENOX) injection  40 mg Subcutaneous Q24H  . erythromycin  1 application Right Eye QID  . feeding supplement (ENSURE ENLIVE)  237 mL Oral BID BM  . memantine  28 mg Oral q morning - 10a  . potassium chloride  40 mEq Oral Once  . sodium chloride flush  3 mL Intravenous Q12H  . tamsulosin  0.4 mg Oral QPC breakfast   Continuous Infusions: . sodium chloride 100 mL/hr at 05/27/16 0651     LOS: 0 days    Time spent in minutes: 47    [place], MD [facility] Pager: [email or web address] Password [email or web address] 05/27/2016, 3:15 PM

## 2016-05-27 NOTE — Evaluation (Signed)
Physical Therapy Evaluation and Discharge Patient Details Name: [name] Galloway MRN: [id number] DOB: 08-May-1935 Today's Date: 05/27/2016   History of Present Illness  81 y.o.malewith medical history significant of aortic valve replacement, CAD s/p CABG, hypertension, dementia. His wife then tried to pull his pants down to help him urinate, however, he went limp and fell. He had loss of consciousness for about 5 minutes. There was no slowing to baseline mental status upon arousal. + orthostatics on evening of admission- normal after IVF bolus. Head CT negative    Clinical Impression  Patient evaluated by Physical Therapy with no further PT needs identified. Wife not present, however per chart she was assisting patient with toileting PTA. He is currently minguard to supervision for mobility with no loss of balance or need for external support. Anticipate this is patient's baseline. PT is signing off. Thank you for this referral.     Follow Up Recommendations No PT follow up;Supervision/Assistance - 24 hour (due to cognition)    Equipment Recommendations  None recommended by PT    Recommendations for Other Services       Precautions / Restrictions Precautions Precautions: Fall;Other (comment) Precaution Comments: syncope      Mobility  Bed Mobility                  Transfers Overall transfer level: Needs assistance Equipment used: None Transfers: Sit to/from Stand Sit to Stand: Supervision         General transfer comment: safe transition  Ambulation/Gait Ambulation/Gait assistance: Min guard Ambulation Distance (Feet): 150 Feet Assistive device: None Gait Pattern/deviations: WFL(Within Functional Limits)   Gait velocity interpretation: Below normal speed for age/gender General Gait Details: no imbalance or drifting  Stairs            Wheelchair Mobility    Modified Rankin (Stroke Patients Only)       Balance Overall balance assessment: Needs  assistance Sitting-balance support: No upper extremity supported;Feet supported Sitting balance-Leahy Scale: Good     Standing balance support: No upper extremity supported Standing balance-Leahy Scale: Good                               Pertinent Vitals/Pain Pain Assessment: No/denies pain    Home Living Family/patient expects to be discharged to:: Unsure                 Additional Comments: pt with dementia; wife not present    Prior Function Level of Independence: Needs assistance   Gait / Transfers Assistance Needed: unclear per chart  ADL's / Homemaking Assistance Needed: per chart, wife assists with patient's clothing when toileting (likely assists with all ADLs)        Hand Dominance        Extremity/Trunk Assessment   Upper Extremity Assessment: Overall WFL for tasks assessed           Lower Extremity Assessment: Overall WFL for tasks assessed      Cervical / Trunk Assessment: Normal  Communication   Communication: No difficulties  Cognition Arousal/Alertness: Awake/alert Behavior During Therapy: WFL for tasks assessed/performed Overall Cognitive Status: No family/caregiver present to determine baseline cognitive functioning (able to hold conversation; reports he remembers falling (?))                      General Comments General comments (skin integrity, edema, etc.): Spoke with RN as wife has not been here today.  Patient was found earlier incontinent of BM (? his baseline)    Exercises     Assessment/Plan    PT Assessment Patent does not need any further PT services  PT Problem List            PT Treatment Interventions      PT Goals (Current goals can be found in the Care Plan section)  Acute Rehab PT Goals Patient Stated Goal: unable PT Goal Formulation: All assessment and education complete, DC therapy    Frequency     Barriers to discharge        Co-evaluation               End of Session  Equipment Utilized During Treatment: Gait belt Activity Tolerance: Patient tolerated treatment well Patient left: in chair;with call bell/phone within reach;with chair alarm set Nurse Communication: Mobility status (d/c home with wife seems to be plan)    Functional Assessment Tool Used: clinical judgement Functional Limitation: Mobility: Walking and moving around Mobility: Walking and Moving Around Current Status [id number]): At least 1 percent but less than 20 percent impaired, limited or restricted Mobility: Walking and Moving Around Goal Status [phone]): At least 1 percent but less than 20 percent impaired, limited or restricted Mobility: Walking and Moving Around Discharge Status [phone]): At least 1 percent but less than 20 percent impaired, limited or restricted    Time: 1528-1550 PT Time Calculation (min) (ACUTE ONLY): 22 min   Charges:   PT Evaluation $PT Eval Low Complexity: 1 Procedure     PT G Codes:   PT G-Codes **NOT FOR INPATIENT CLASS** Functional Assessment Tool Used: clinical judgement Functional Limitation: Mobility: Walking and moving around Mobility: Walking and Moving Around Current Status [id number]): At least 1 percent but less than 20 percent impaired, limited or restricted Mobility: Walking and Moving Around Goal Status [phone]): At least 1 percent but less than 20 percent impaired, limited or restricted Mobility: Walking and Moving Around Discharge Status [phone]): At least 1 percent but less than 20 percent impaired, limited or restricted    Abbie Jablon 05/27/2016, 4:28 PM Pager [phone]

## 2016-05-27 NOTE — Progress Notes (Signed)
CM spoke to patient at the bedside to deliver [facility] letter but notes that he is confused therefore unable to successfully delvier. Copy left at bedside for spouse and CM called spouse at listed number of [phone] and left HIPAA compliant VM requesting callback.

## 2016-05-27 NOTE — Progress Notes (Signed)
  Echocardiogram 2D Echocardiogram has been performed.  [name] Galloway 05/27/2016, 11:37 AM

## 2016-05-27 NOTE — Consult Note (Signed)
Cardiology Consult    Patient ID: [name] Galloway MRN: [id number], DOB/AGE: [birth year]   Admit date: 05/26/2016 Date of Consult: 05/27/2016  Primary Physician: Jani Gravel, MD Reason for Consult: Syncope Primary Cardiologist: Dr. Irish Lack Requesting Provider: Dr. Wynelle Cleveland  Patient Profile    [name] Galloway is a 81 year old male with a past medical history of advanced dementia,AVR with bioprosthetic valve, syncope, HTN, and HLD. Also with history of CAD s/p CABG x 1 (SVG-RCA done at the same time that AVR was done 03/2014). Presented to the ED on 05/26/16 with syncope, cardiology was consulted.   History of Present Illness    [name] Galloway was at home and was found standing at the sink with the water running, it had been on for awhile as the sink was getting near to overflowing. His wife came to assist him and tried to help him urinate. She tried to pull down his pants, but he went limp and sunk to the floor. According to the patient's wife, he was unconscious for about 5 minutes. EMS was called. His EKG on arrival to the ED showed NSR with LBBB with frequent PVC's.   He was last seen by Dr. Irish Lack in May 2016, it was noted that he had several syncopal episodes. His metoprolol was discontinued as it was felt that was contributing to his syncope.   His last Echo was in Oct. 2016 and showed an EF of 60%, atrial septum was noted to be borderline aneurysmal. Aortic valve had peak gradient of 31mm Hg, mean was 11mm Hg.  [name] Galloway is disoriented x 3 at the time of my encounter, he tells me that he does know that sometimes he feels weak and can no longer stand up. He says that he remembers getting weak yesterday and falling. Denies chest pain and SOB.   Past Medical History   Past Medical History:  Diagnosis Date  . Anemia   . Atrial flutter ([place])   . Coronary artery disease   . Dementia   . H/O hiatal hernia   . Hx of echocardiogram    Echo (8/15):  Mild LVH, EF 60-65%, Gr 2 DD, AVR  ok (mean 14 mmHg), MAC, mild to mod MR, mod LAE  . Incontinence of bowel   . Incontinence of urine   . Psoriasis   . Sigmoid volvulus [facility])     Past Surgical History:  Procedure Laterality Date  . AORTIC VALVE REPLACEMENT N/A 03/13/2014   Procedure: AORTIC VALVE REPLACEMENT (AVR);  Surgeon: Gaye Pollack, MD;  Location: [place];  Service: Open Heart Surgery;  Laterality: N/A;  . CARDIAC CATHETERIZATION    . COLONOSCOPY  2011   Blockage  . CORONARY ARTERY BYPASS GRAFT N/A 03/13/2014   Procedure: Coronary Artery Bypass Grafting times one using right greater saphenous vein via endovein harvest.;  Surgeon: Gaye Pollack, MD;  Location: [place] OR;  Service: Open Heart Surgery;  Laterality: N/A;  . FLEXIBLE SIGMOIDOSCOPY  09/20/2011   Procedure: FLEXIBLE SIGMOIDOSCOPY;  Surgeon: Jeryl Columbia, MD;  Location: [facility] ENDOSCOPY;  Service: Endoscopy;  Laterality: N/A;  . HERNIA REPAIR    . INTRAOPERATIVE TRANSESOPHAGEAL ECHOCARDIOGRAM N/A 03/13/2014   Procedure: INTRAOPERATIVE TRANSESOPHAGEAL ECHOCARDIOGRAM;  Surgeon: Gaye Pollack, MD;  Location: [facility] OR;  Service: Open Heart Surgery;  Laterality: N/A;  . LEFT AND RIGHT HEART CATHETERIZATION WITH CORONARY ANGIOGRAM N/A 02/26/2014   Procedure: LEFT AND RIGHT HEART CATHETERIZATION WITH CORONARY ANGIOGRAM;  Surgeon: Jettie Booze, MD;  Location:  [place] CATH LAB;  Service: Cardiovascular;  Laterality: N/A;  . TONSILLECTOMY       Allergies  Allergies  Allergen Reactions  . Ciprofloxacin Hives and Rash    Inpatient Medications    . aspirin EC  81 mg Oral Daily  . enoxaparin (LOVENOX) injection  40 mg Subcutaneous Q24H  . erythromycin  1 application Right Eye QID  . feeding supplement (ENSURE ENLIVE)  237 mL Oral BID BM  . memantine  28 mg Oral q morning - 10a  . potassium chloride  40 mEq Oral Once  . sodium chloride flush  3 mL Intravenous Q12H  . tamsulosin  0.4 mg Oral QPC breakfast    Family History    Family History  Problem Relation Age of  Onset  . Cancer Mother   . Hypertension Mother   . Cancer Father   . Cancer Sister   . Heart attack Son   . Heart disease Son     before age 22    Social History    Social History   Social History  . Marital status: Married    Spouse name: N/A  . Number of children: 2  . Years of education: N/A   Occupational History  . Retired Paediatric nurse   Social History Main Topics  . Smoking status: Former Smoker    Packs/day: 0.50    Years: 10.00    Quit date: 09/20/1971  . Smokeless tobacco: Never Used  . Alcohol use No  . Drug use: No  . Sexual activity: Not on file   Other Topics Concern  . Not on file   Social History Narrative  . No narrative on file     Review of Systems    General:  No chills, fever, night sweats or weight changes.  Cardiovascular:  No chest pain, dyspnea on exertion, edema, orthopnea, palpitations, paroxysmal nocturnal dyspnea. Dermatological: No rash, lesions/masses Respiratory: No cough, dyspnea Urologic: No hematuria, dysuria Abdominal:   No nausea, vomiting, diarrhea, bright red blood per rectum, melena, or hematemesis Neurologic:  No visual changes, wkns, changes in mental status. +syncope All other systems reviewed and are otherwise negative except as noted above.  Physical Exam    Blood pressure 138/72, pulse 78, temperature 97.8 F (36.6 C), temperature source Oral, resp. rate 18, height 6' (1.829 m), weight 154 lb 1.6 oz (69.9 kg), SpO2 95 %.  General: Pleasant, NAD Psych: Normal affect. Neuro: Disoriented x 3, history of dementia. Moves all extremities spontaneously. HEENT: Normal  Neck: Supple without bruits or JVD. Lungs:  Resp regular and unlabored, CTA. Heart: Irregularly regular rhythm. No s3, s4. 2/6 systolic murmur. Abdomen: Soft, non-tender, non-distended, BS + x 4.  Extremities: No clubbing, cyanosis or edema. DP/PT/Radials 2+ and equal bilaterally.  Labs    Troponin [facility] Test)  Recent Labs  05/26/16 1655    TROPIPOC 0.01    Recent Labs  05/26/16 2045 05/27/16 0154 05/27/16 0951  TROPONINI <0.03 <0.03 <0.03   Lab Results  Component Value Date   WBC 6.2 05/27/2016   HGB 12.3 (L) 05/27/2016   HCT 37.6 (L) 05/27/2016   MCV 87.0 05/27/2016   PLT 127 (L) 05/27/2016    Recent Labs Lab 05/26/16 1645  05/27/16 0951  NA 138  < > 137  K 3.6  < > 3.3*  CL 107  < > 109  CO2 25  --  20*  BUN 22*  < > 23*  CREATININE 1.57*  < > 1.42*  CALCIUM 8.6*  --  8.4*  PROT 6.2*  --   --   BILITOT 0.8  --   --   ALKPHOS 50  --   --   ALT 12*  --   --   AST 18  --   --   GLUCOSE 94  < > 82  < > = values in this interval not displayed. No results found for: CHOL, HDL, LDLCALC, TRIG No results found for: [facility]   Radiology Studies    Dg Chest 2 View  Result Date: 05/26/2016 CLINICAL DATA:  81 y/o  M; syncope. EXAM: CHEST  2 VIEW COMPARISON:  07/24/2014 chest radiograph.  04/29/2013 CT of chest. FINDINGS: Stable cardiac silhouette within normal limits. Median sternotomy wires are intact. Aortic valve replacement. Clear lungs. No pneumothorax or pleural effusion. No acute osseous abnormality is evident. Prominent loop of transverse colon transposition over liver and stomach in the upper abdomen as seen on prior CT. IMPRESSION: No active cardiopulmonary disease. Electronically Signed   By: Kristine Garbe M.D.   On: 05/26/2016 16:23   Ct Head Wo Contrast  Result Date: 05/26/2016 CLINICAL DATA:  Syncope.  Altered mental status. EXAM: CT HEAD WITHOUT CONTRAST TECHNIQUE: Contiguous axial images were obtained from the base of the skull through the vertex without intravenous contrast. COMPARISON:  08/03/2015 brain MRI. FINDINGS: Brain: No evidence of parenchymal hemorrhage or extra-axial fluid collection. No mass lesion, mass effect, or midline shift. No CT evidence of acute infarction. Intracranial atherosclerosis. Generalized cerebral volume loss. Nonspecific mild subcortical and periventricular  white matter hypodensity, most in keeping with chronic small vessel ischemic change. Cerebral ventricle sizes are stable and concordant with the degree of cerebral volume loss. Vascular: No hyperdense vessel or unexpected calcification. Skull: No evidence of calvarial fracture. Sinuses/Orbits: The visualized paranasal sinuses are essentially clear. Other:  The mastoid air cells are unopacified. IMPRESSION: 1.  No evidence of acute intracranial abnormality. 2. Generalized cerebral volume loss and mild chronic small vessel ischemia. Electronically Signed   By: Ilona Sorrel M.D.   On: 05/26/2016 16:41    EKG & Cardiac Imaging    EKG: NSR, LBBB, frequent PVC's  Echocardiogram: pending   Assessment & Plan    1. Syncope: Patient has a history of syncope. He has advanced dementia and history is somewhat difficult to obtain. Patient has a significant amount of PVC's on telemetry. His beta blocker was stopped about a year ago as it was felt that his syncope was likely related to orthostasis. His BP is stable and orthostatics are negative.  Will follow echo results.   2. CAD s/p CABG: Denies chest pain. Left heart cath in July 2015 showed normal left main, normal LAD, widely patent circumflex. His RCA had 90% stenosis, this was treated with single bypass in 2015. Continue ASA.   3. Aortic stenosis s/p AVR: Stable, likely not contributory to his syncope, but will assess valve by Echo.   Signed, Arbutus Leas, NP 05/27/2016, 3:21 PM Pager: [phone]  Patient seen, examined. Available data reviewed. Agree with findings, assessment, and plan as outlined by Jettie Booze, NP. The patient is independently interviewed and examined. His wife is at bedside. He is alert and responds appropriately to questions. He is not able to provide any meaningful history because of his dementia. JVP is normal, lungs are clear, heart is regular rate and rhythm with a 2/6 systolic murmur at the right upper sternal border,  abdomen is soft and nontender, extremities are without  edema.  Echocardiogram is reviewed with the findings below: Study Conclusions  - Left ventricle: Inferobasal and septal hypokinesis Wall thickness   was increased in a pattern of mild LVH. Systolic function was   mildly to moderately reduced. The estimated ejection fraction was   in the range of 40% to 45%. Doppler parameters are consistent   with abnormal left ventricular relaxation (grade 1 diastolic   dysfunction). - Aortic valve: There was moderate stenosis. - Mitral valve: Severely calcified annulus. - Left atrium: The atrium was severely dilated. - Atrial septum: No defect or patent foramen ovale was identified.  Review of telemetry shows frequent PVCs. I think best to keep him off his beta blocker at this time. He is not able to wear a heart monitor as an outpatient because of his significant dementia. His wife states that he would just let off. His echocardiogram does show some worsening of LV function but I really don't think he is a candidate for further evaluation of ischemic heart disease. At this time would just treat him supportively. He does not have any signs of congestive heart failure on exam. We discussed the importance of adequate fluid intake and he may liberalize sodium to avoid orthostasis/volume depletion.  Sherren Mocha, M.D. 05/27/2016 6:11 PM

## 2016-05-28 DIAGNOSIS — R55 Syncope and collapse: Secondary | ICD-10-CM | POA: Diagnosis not present

## 2016-05-28 DIAGNOSIS — N400 Enlarged prostate without lower urinary tract symptoms: Secondary | ICD-10-CM

## 2016-05-28 DIAGNOSIS — F0150 Vascular dementia without behavioral disturbance: Secondary | ICD-10-CM

## 2016-05-28 DIAGNOSIS — I10 Essential (primary) hypertension: Secondary | ICD-10-CM | POA: Diagnosis not present

## 2016-05-28 DIAGNOSIS — I350 Nonrheumatic aortic (valve) stenosis: Secondary | ICD-10-CM

## 2016-05-28 DIAGNOSIS — I5022 Chronic systolic (congestive) heart failure: Secondary | ICD-10-CM | POA: Diagnosis not present

## 2016-05-28 DIAGNOSIS — Z952 Presence of prosthetic heart valve: Secondary | ICD-10-CM | POA: Diagnosis not present

## 2016-05-28 DIAGNOSIS — N183 Chronic kidney disease, stage 3 (moderate): Secondary | ICD-10-CM | POA: Diagnosis not present

## 2016-05-28 LAB — GLUCOSE, CAPILLARY: GLUCOSE-CAPILLARY: 77 mg/dL (ref 65–99)

## 2016-05-28 NOTE — Discharge Summary (Signed)
Physician Discharge Summary  [name] Galloway [id number] DOB: 1935-01-08 DOA: 05/26/2016  PCP: Jani Gravel, MD  Admit date: 05/26/2016 Discharge date: 05/28/2016  Admitted From: home Disposition:  home   Recommendations for Outpatient Follow-up:  1. F/u orthostatic vitals   Discharge Condition:  stable   CODE STATUS:  Full Code Diet recommendation:  Regular diet Consultations:  cardiology    Discharge Diagnoses:  Principal Problem:   Syncope Active Problems:   Aortic stenosis   Essential hypertension, benign   Dementia   S/P Bioprosthetic AVR (aortic valve replacement)   Coronary atherosclerosis of native coronary artery   CKD (chronic kidney disease) stage 3, GFR 30-59 ml/min   Chronic systolic CHF (congestive heart failure) (HCC)   BPH (benign prostatic hyperplasia)    Subjective: Confused. Thinks he fell this morning. No complaints.   Brief Summary: [name] Galloway a 81 y.o.malewith medical history significant of aortic valve replacement, CAD s/p CABG, hypertension, dementia. Patient is not able to provide a history secondary to not remembering what happened. History mainly provided by patient's wife.  Around 1:30pm this afternoon, the patient was found to have turned on the water in the sink to the point of overflowing. His wife then tried to pull his pants down to help him urinate, however, he went limp and fell. He had loss of consciousness for about 5 minutes. There was no slowing to baseline mental status upon arousal. He did not report chest pain, dyspnea or palpitations to his wife before the episode. No loss of urine or bowel. No abnormal movements  Hospital Course:  Principal Problem:   Syncope- likely orthostatic - + orthostatics on evening of admission- now normal after IVF bolus and continuous IVF - also on Flomax which I will continue  - ECHO shows mod AS and new drop in EF - head CT unremarkable, TSH normal  Active Problems: New finding of  sCHF- chronic - EF 40-45% with mod AS- see report below - cardiology consult appreciated- recommendations:  hold off B blocker which he was previously on but was stopped 1 yr ago- not a candidate for further work up    Aortic stenosis s/p bioprosthetic valve replacement - moderate per ECHO    Dementia - oriented only to self    Coronary atherosclerosis of native coronary artery -Aspirin  Hypokalemia - replace and follow- on daily K replacement at home    CKD (chronic kidney disease) stage 3, GFR 30-59 ml/min  BPH - Tamsulosin    Discharge Instructions  Discharge Instructions    Discharge instructions    Complete by:  As directed    Regular diet   Increase activity slowly    Complete by:  As directed        Medication List    TAKE these medications   aspirin EC 81 MG tablet Take 81 mg by mouth daily.   CoQ10 100 MG Caps Take 100 mg by mouth daily.   erythromycin ophthalmic ointment Place 1 application into the right eye 4 (four) times daily.   KLOR-CON M20 20 MEQ tablet Generic drug:  potassium chloride SA Take 20 mEq by mouth 2 (two) times daily.   NAMENDA XR 28 MG Cp24 24 hr capsule Generic drug:  memantine Take 1 capsule by mouth every morning.   tamsulosin 0.4 MG Caps capsule Commonly known as:  FLOMAX Take 0.4 mg by mouth daily after breakfast.   VAYACOG 100-19.5-6.5 MG Caps Generic drug:  Phosphatidylserine-DHA-EPA Take 1 capsule by mouth every morning.  Allergies  Allergen Reactions  . Ciprofloxacin Hives and Rash     Procedures/Studies: 2 D ECHO Left ventricle: Inferobasal and septal hypokinesis Wall thickness   was increased in a pattern of mild LVH. Systolic function was   mildly to moderately reduced. The estimated ejection fraction was   in the range of 40% to 45%. Doppler parameters are consistent   with abnormal left ventricular relaxation (grade 1 diastolic   dysfunction). - Aortic valve: There was moderate  stenosis. - Mitral valve: Severely calcified annulus. - Left atrium: The atrium was severely dilated. - Atrial septum: No defect or patent foramen ovale was identified.   Dg Chest 2 View  Result Date: 05/26/2016 CLINICAL DATA:  81 y/o  M; syncope. EXAM: CHEST  2 VIEW COMPARISON:  07/24/2014 chest radiograph.  04/29/2013 CT of chest. FINDINGS: Stable cardiac silhouette within normal limits. Median sternotomy wires are intact. Aortic valve replacement. Clear lungs. No pneumothorax or pleural effusion. No acute osseous abnormality is evident. Prominent loop of transverse colon transposition over liver and stomach in the upper abdomen as seen on prior CT. IMPRESSION: No active cardiopulmonary disease. Electronically Signed   By: Kristine Garbe M.D.   On: 05/26/2016 16:23   Ct Head Wo Contrast  Result Date: 05/26/2016 CLINICAL DATA:  Syncope.  Altered mental status. EXAM: CT HEAD WITHOUT CONTRAST TECHNIQUE: Contiguous axial images were obtained from the base of the skull through the vertex without intravenous contrast. COMPARISON:  08/03/2015 brain MRI. FINDINGS: Brain: No evidence of parenchymal hemorrhage or extra-axial fluid collection. No mass lesion, mass effect, or midline shift. No CT evidence of acute infarction. Intracranial atherosclerosis. Generalized cerebral volume loss. Nonspecific mild subcortical and periventricular white matter hypodensity, most in keeping with chronic small vessel ischemic change. Cerebral ventricle sizes are stable and concordant with the degree of cerebral volume loss. Vascular: No hyperdense vessel or unexpected calcification. Skull: No evidence of calvarial fracture. Sinuses/Orbits: The visualized paranasal sinuses are essentially clear. Other:  The mastoid air cells are unopacified. IMPRESSION: 1.  No evidence of acute intracranial abnormality. 2. Generalized cerebral volume loss and mild chronic small vessel ischemia. Electronically Signed   By: Ilona Sorrel  M.D.   On: 05/26/2016 16:41        Discharge Exam: Vitals:   05/28/16 0346 05/28/16 1241  BP: (!) 121/48 (!) 115/99  Pulse: 63 (!) 107  Resp: 18 18  Temp: 98.6 F (37 C) 97.9 F (36.6 C)   Vitals:   05/27/16 2225 05/28/16 0240 05/28/16 0346 05/28/16 1241  BP: 128/61  (!) 121/48 (!) 115/99  Pulse: 63  63 (!) 107  Resp: 19  18 18   Temp: 98.2 F (36.8 C)  98.6 F (37 C) 97.9 F (36.6 C)  TempSrc: Oral  Oral Oral  SpO2: 100%  95% 100%  Weight:  70.7 kg (155 lb 13.8 oz)    Height:        General: Pt is alert, awake, not in acute distress Cardiovascular: RRR, S1/S2 +, no rubs, no gallops Respiratory: CTA bilaterally, no wheezing, no rhonchi Abdominal: Soft, NT, ND, bowel sounds + Extremities: no edema, no cyanosis    The results of significant diagnostics from this hospitalization (including imaging, microbiology, ancillary and laboratory) are listed below for reference.     Microbiology: No results found for this or any previous visit (from the past 240 hour(s)).   Labs: BNP (last 3 results) No results for input(s): BNP in the last 8760 hours. Basic Metabolic Panel:  Recent Labs Lab 05/26/16 1645 05/26/16 1658 05/27/16 0951  NA 138 140 137  K 3.6 3.6 3.3*  CL 107 104 109  CO2 25  --  20*  GLUCOSE 94 89 82  BUN 22* 25* 23*  CREATININE 1.57* 1.50* 1.42*  CALCIUM 8.6*  --  8.4*   Liver Function Tests:  Recent Labs Lab 05/26/16 1645  AST 18  ALT 12*  ALKPHOS 50  BILITOT 0.8  PROT 6.2*  ALBUMIN 3.9   No results for input(s): LIPASE, AMYLASE in the last 168 hours. No results for input(s): AMMONIA in the last 168 hours. CBC:  Recent Labs Lab 05/26/16 1645 05/26/16 1658 05/27/16 0951  WBC 8.9  --  6.2  NEUTROABS 7.6  --   --   HGB 12.8* 12.9* 12.3*  HCT 38.2* 38.0* 37.6*  MCV 86.6  --  87.0  PLT 132*  --  127*   Cardiac Enzymes:  Recent Labs Lab 05/26/16 2045 05/27/16 0154 05/27/16 0951  TROPONINI <0.03 <0.03 <0.03    BNP: Invalid input(s): POCBNP CBG:  Recent Labs Lab 05/27/16 0658 05/28/16 0630  GLUCAP 76 77   D-Dimer No results for input(s): DDIMER in the last 72 hours. Hgb A1c No results for input(s): HGBA1C in the last 72 hours. Lipid Profile No results for input(s): CHOL, HDL, LDLCALC, TRIG, CHOLHDL, LDLDIRECT in the last 72 hours. Thyroid function studies  Recent Labs  05/26/16 2045  TSH 0.774   Anemia work up No results for input(s): VITAMINB12, FOLATE, FERRITIN, TIBC, IRON, RETICCTPCT in the last 72 hours. Urinalysis    Component Value Date/Time   COLORURINE YELLOW 03/13/2014 0710   APPEARANCEUR CLOUDY (A) 03/13/2014 0710   LABSPEC 1.020 03/13/2014 0710   PHURINE 5.0 03/13/2014 0710   GLUCOSEU NEGATIVE 03/13/2014 0710   HGBUR NEGATIVE 03/13/2014 0710   BILIRUBINUR NEGATIVE 03/13/2014 0710   KETONESUR NEGATIVE 03/13/2014 0710   PROTEINUR NEGATIVE 03/13/2014 0710   UROBILINOGEN 0.2 03/13/2014 0710   NITRITE NEGATIVE 03/13/2014 0710   LEUKOCYTESUR NEGATIVE 03/13/2014 0710   Sepsis Labs Invalid input(s): PROCALCITONIN,  WBC,  LACTICIDVEN Microbiology No results found for this or any previous visit (from the past 240 hour(s)).   Time coordinating discharge: Over 30 minutes  SIGNED:   Debbe Odea, MD  [facility] 05/28/2016, 12:57 PM Pager   If 7PM-7AM, please contact night-coverage [email or web address] Password [email or web address]

## 2016-05-28 NOTE — Progress Notes (Signed)
Breakfast arrived and Tech aroused Pt to eat breakfast. Pt fell back to sleep and will not open eyes to eat. However, if Pt is asked would he like to eat some breakfast, he responds yes, but will not open eyes.

## 2016-06-07 DIAGNOSIS — Z09 Encounter for follow-up examination after completed treatment for conditions other than malignant neoplasm: Secondary | ICD-10-CM | POA: Diagnosis not present

## 2016-10-20 DIAGNOSIS — N390 Urinary tract infection, site not specified: Secondary | ICD-10-CM | POA: Diagnosis not present

## 2017-01-09 DIAGNOSIS — R195 Other fecal abnormalities: Secondary | ICD-10-CM | POA: Diagnosis not present

## 2017-02-10 ENCOUNTER — Ambulatory Visit: Payer: Medicare HMO | Admitting: Vascular Surgery

## 2017-03-22 DEATH — deceased

## 2017-08-25 IMAGING — DX DG CHEST 2V
2 series · 2 of 2 positions shown · non-contrast
Comparison: 07/24/2014 chest radiograph.  04/29/2013 CT of chest.

CLINICAL DATA: 81 y/o  M; syncope.

EXAM:
CHEST  2 VIEW

[chest lat]
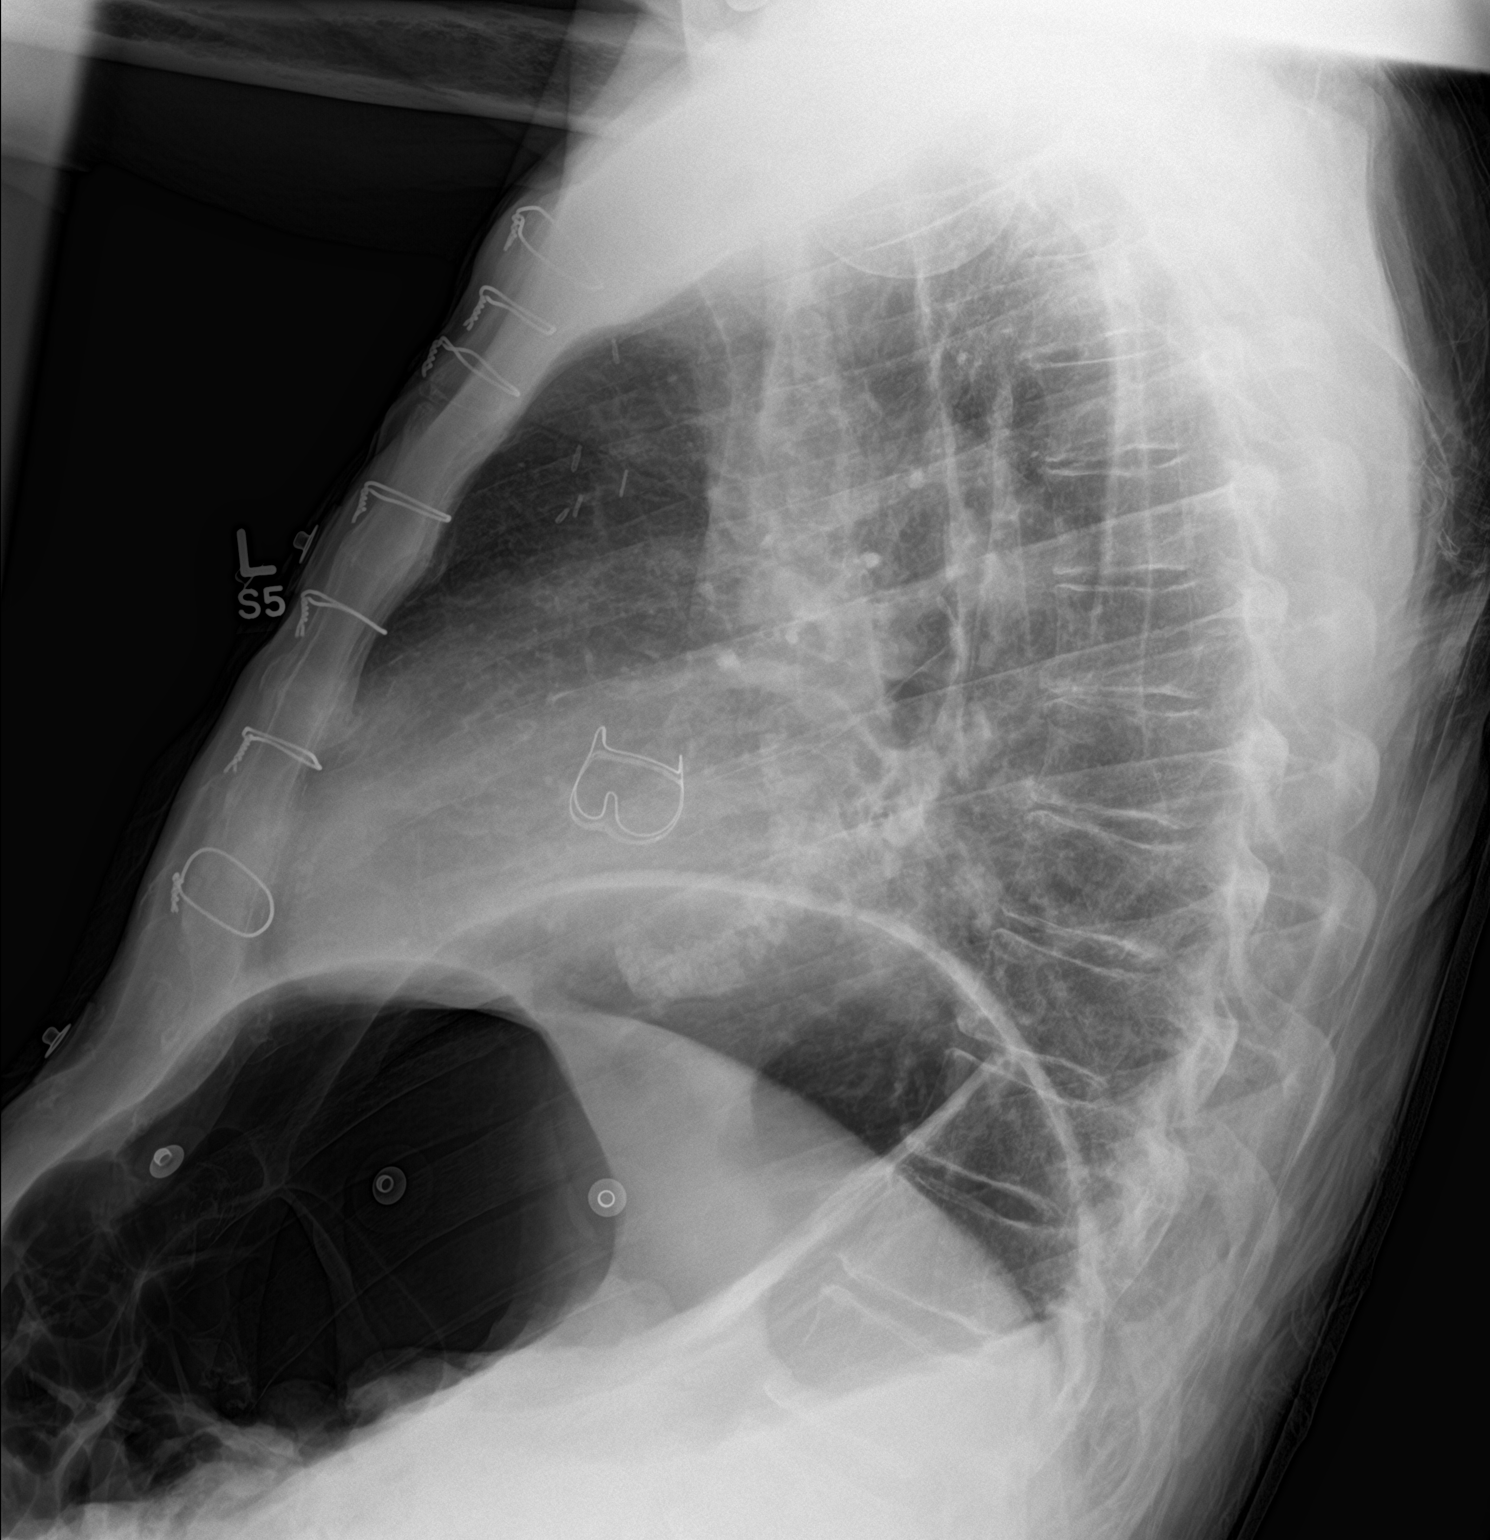

[chest ap]
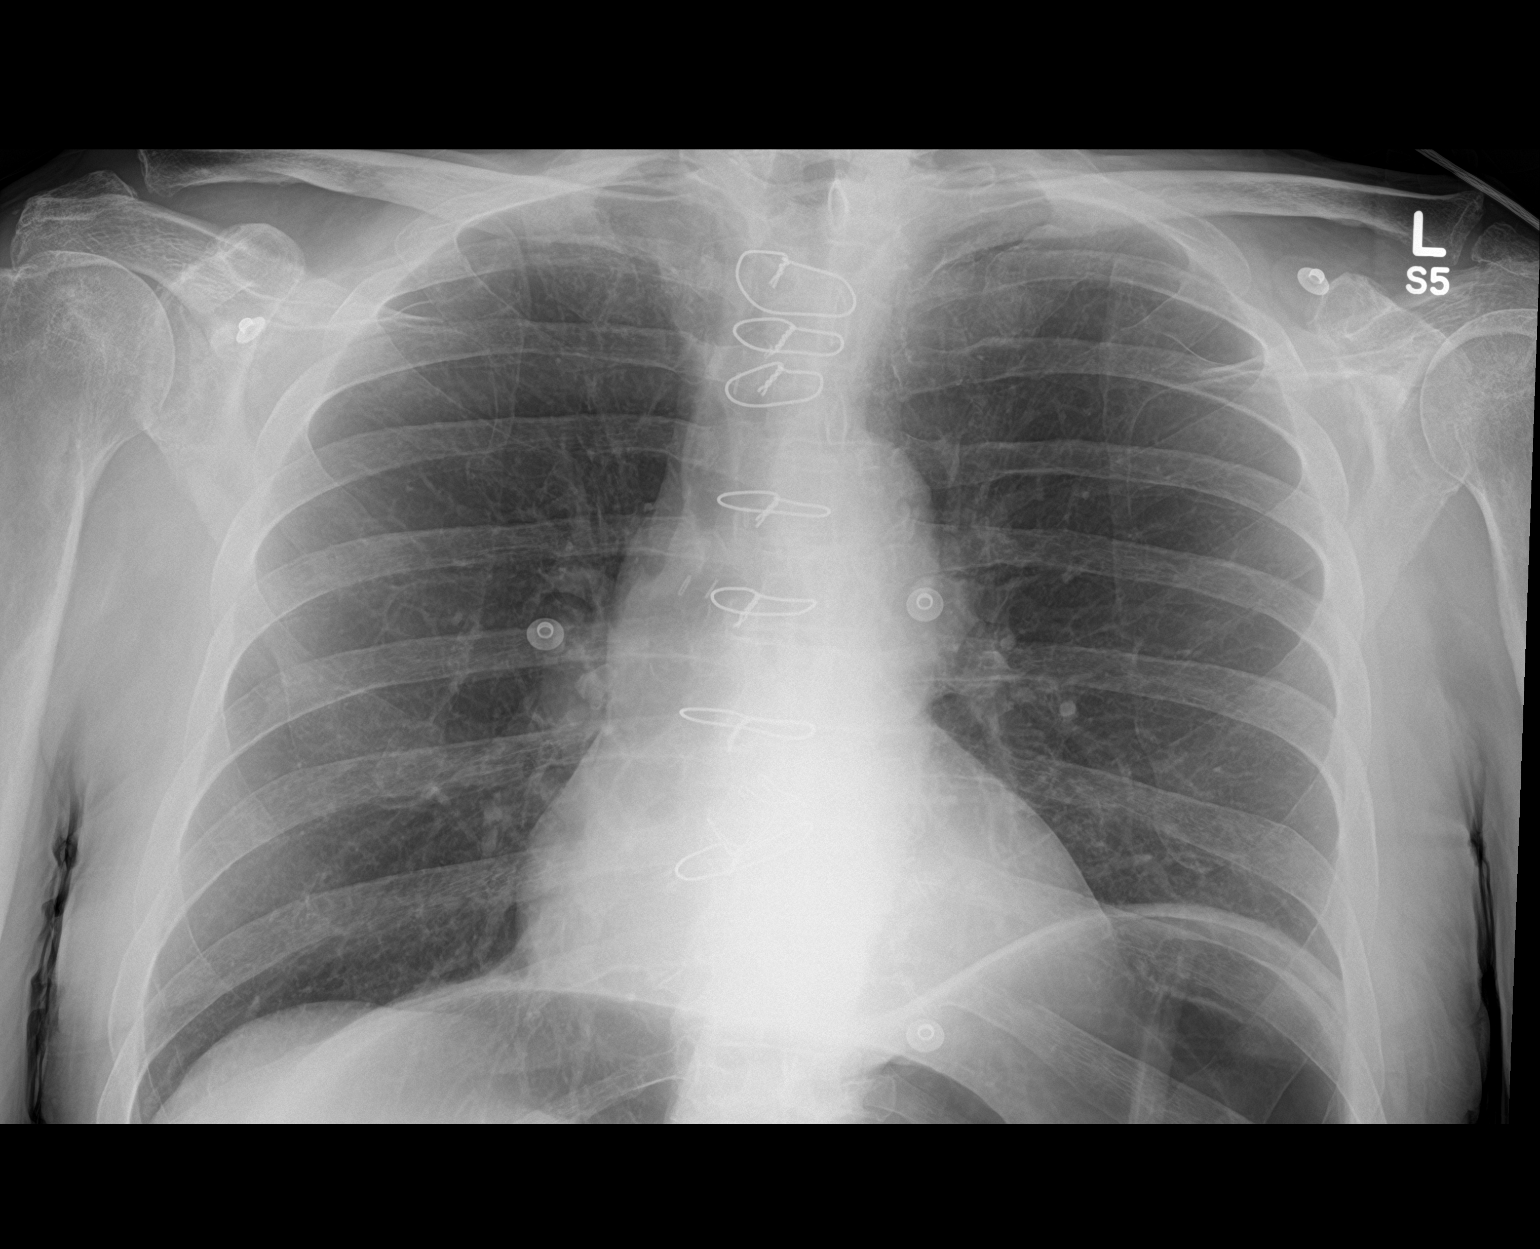

[2 of 2 positions shown; findings below may reference images not displayed]

FINDINGS: Stable cardiac silhouette within normal limits. Median sternotomy
wires are intact. Aortic valve replacement. Clear lungs. No
pneumothorax or pleural effusion. No acute osseous abnormality is
evident. Prominent loop of transverse colon transposition over liver
and stomach in the upper abdomen as seen on prior CT.
IMPRESSION: No active cardiopulmonary disease.

By: Aldion Brabant M.D.

## 2017-08-25 IMAGING — CT CT HEAD W/O CM
3 of 4 series · 13 of 47 positions shown, 15 images · non-contrast
Comparison: 08/03/2015 brain MRI.

CLINICAL DATA: Syncope.  Altered mental status.

EXAM:
CT HEAD WITHOUT CONTRAST
TECHNIQUE: Contiguous axial images were obtained from the base of the skull
through the vertex without intravenous contrast.

[Series 2: head without · axial · non-contrast · 0.45mm/px · z∈[+1246,+1356]mm · 7 of 30 slices shown, 9 images]
[im 4/30  brain]
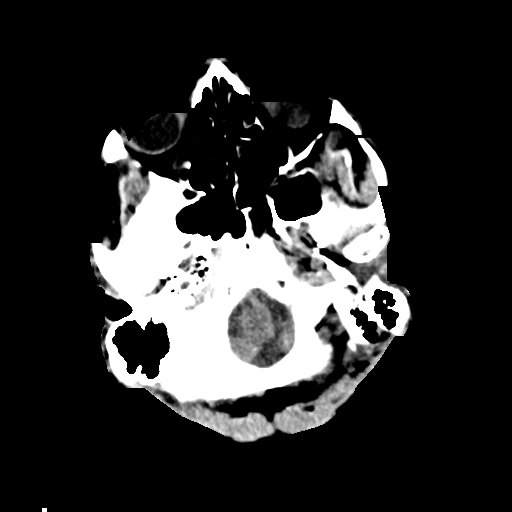
[im 4/30  bone]
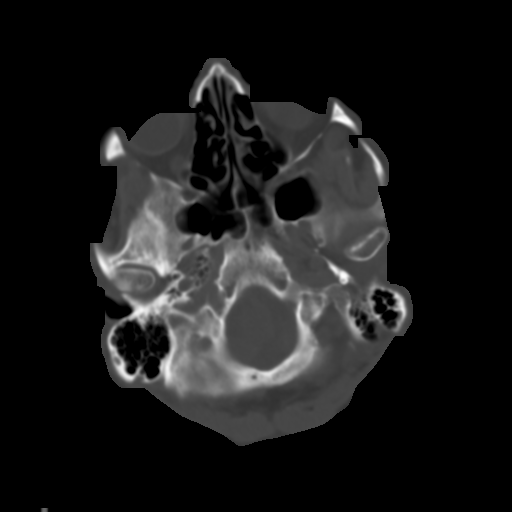
[im 8/30  brain]
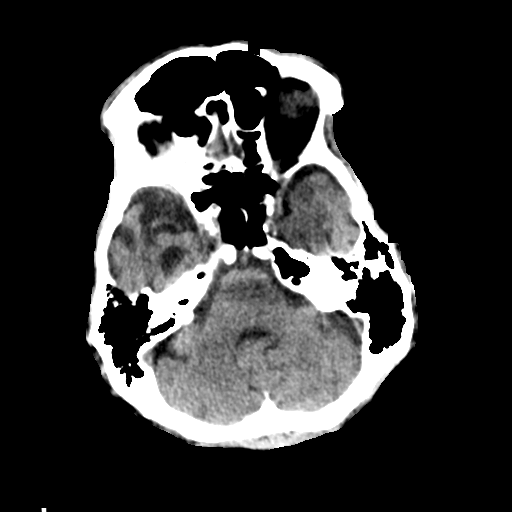
[im 11/30  brain]
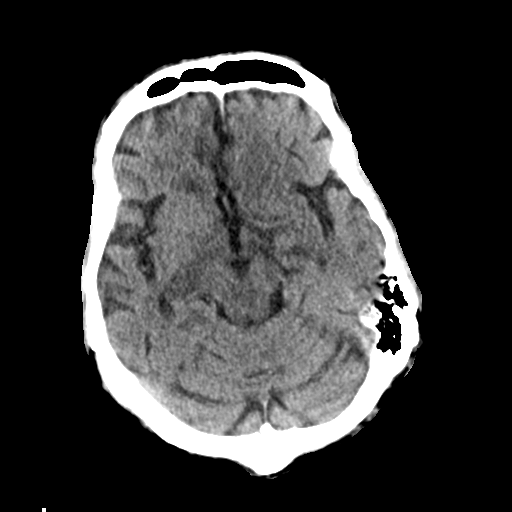
[im 15/30  brain]
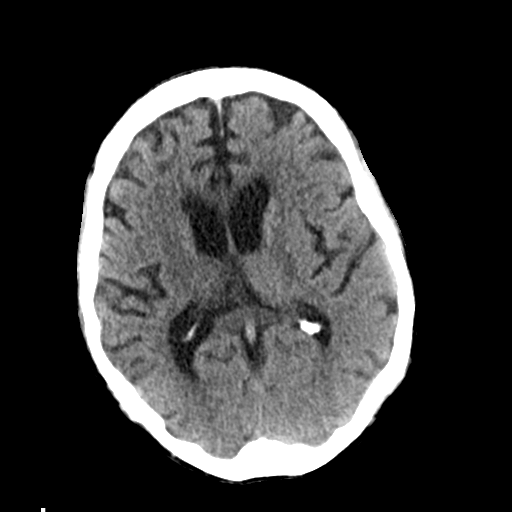
[im 19/30  brain]
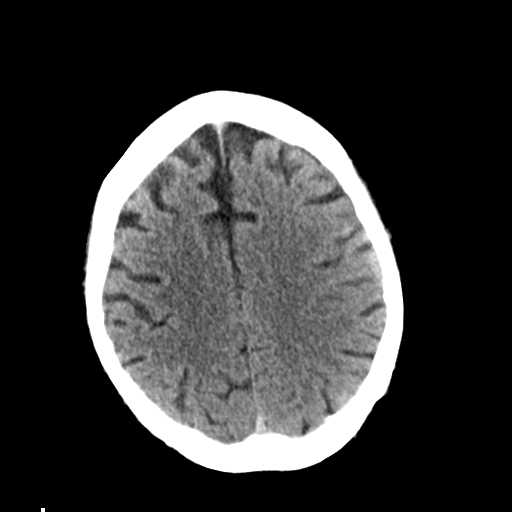
[im 19/30  bone]
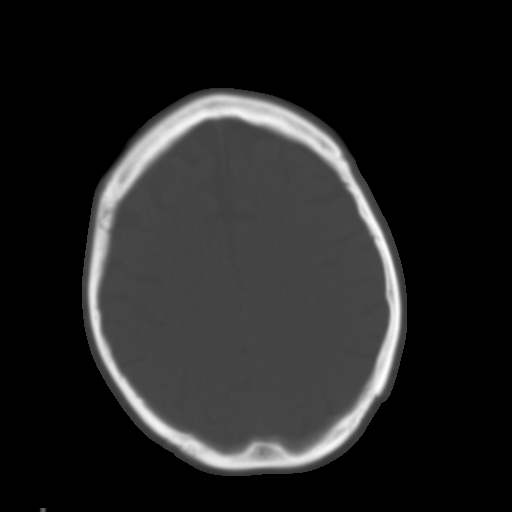
[im 22/30  brain]
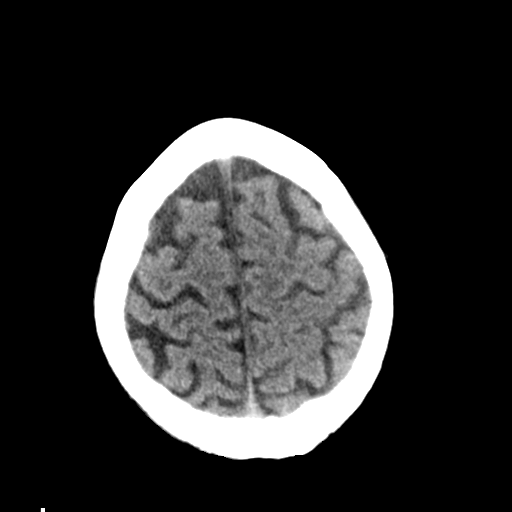
[im 26/30  brain]
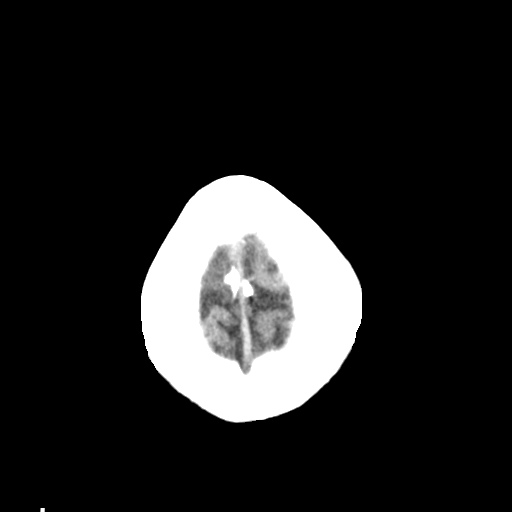

[Series 4: head without cor · coronal · non-contrast · 0.29mm/px · 3 of 63 slices shown]
[im 21/63  brain]
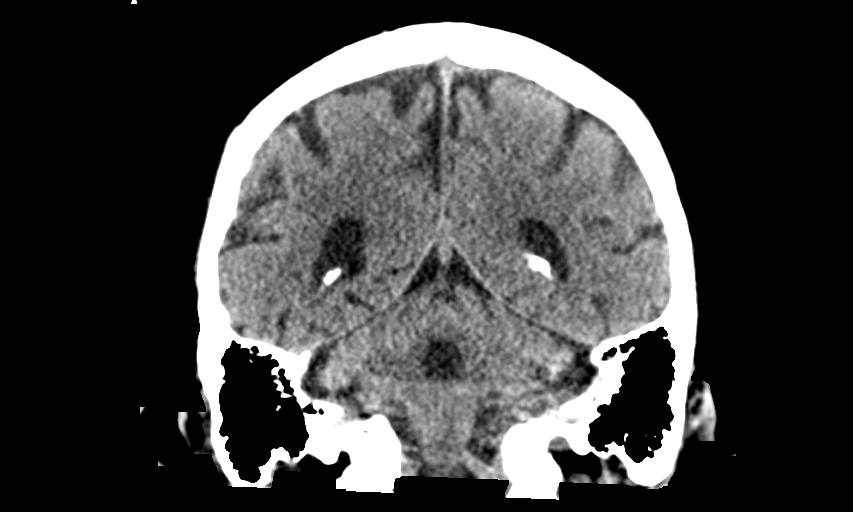
[im 28/63  brain]
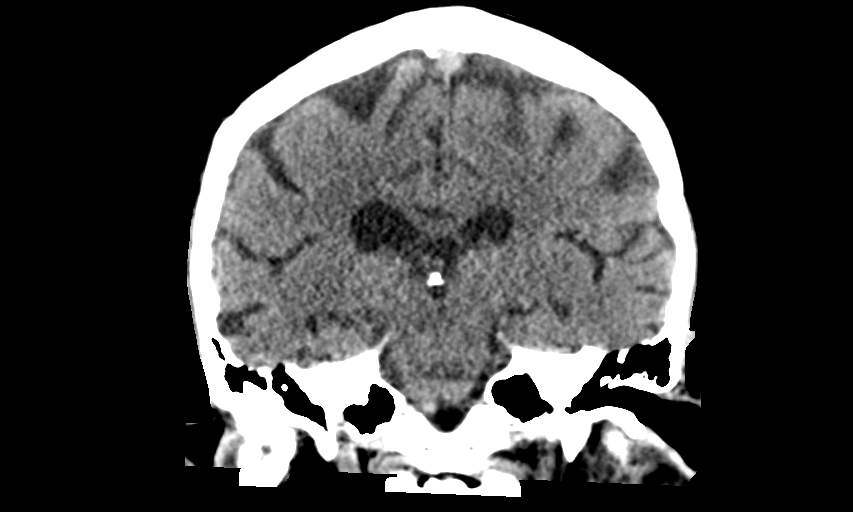
[im 35/63  brain]
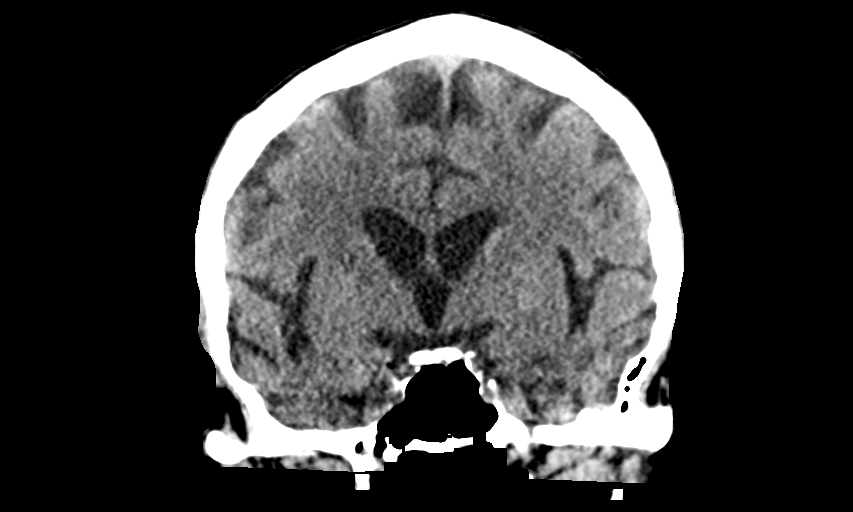

[Series 5: head without sag · sagittal · non-contrast · 0.29mm/px · 3 of 55 slices shown]
[im 19/55  brain]
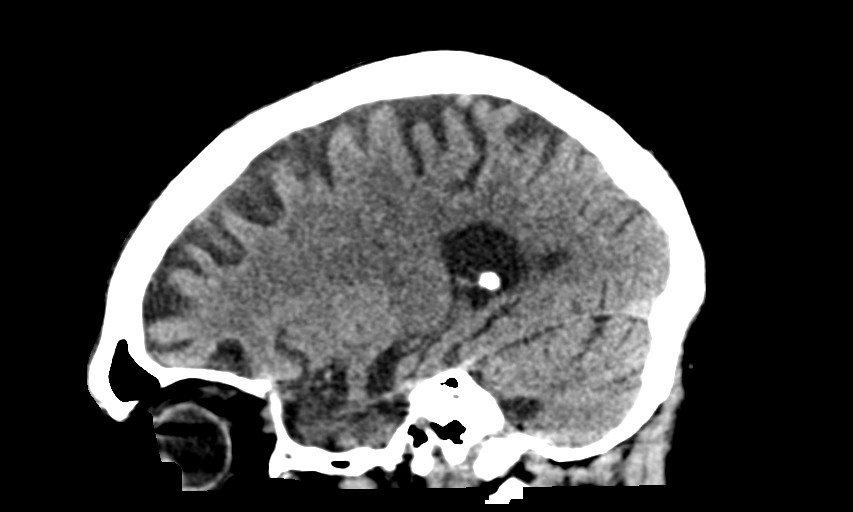
[im 28/55  brain]
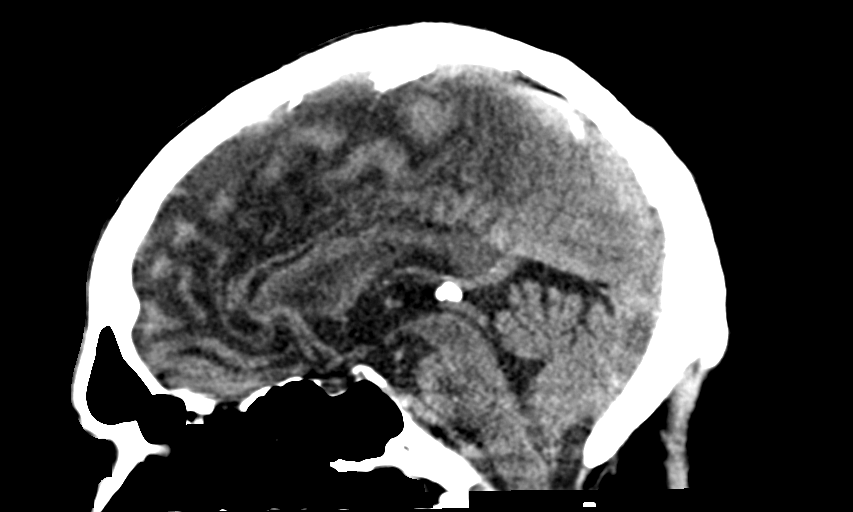
[im 37/55  brain]
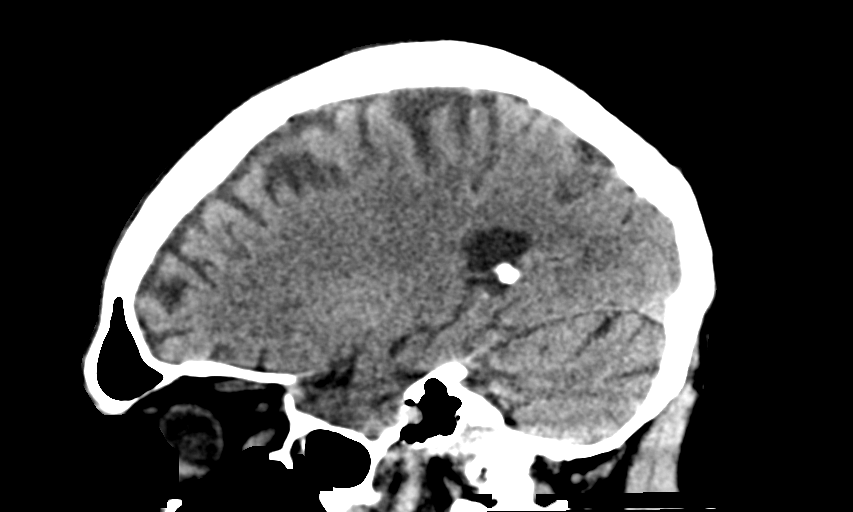

[13 of 47 positions shown; findings below may reference images not displayed]

FINDINGS: Brain: No evidence of parenchymal hemorrhage or extra-axial fluid
collection. No mass lesion, mass effect, or midline shift. No CT
evidence of acute infarction. Intracranial atherosclerosis.
Generalized cerebral volume loss. Nonspecific mild subcortical and
periventricular white matter hypodensity, most in keeping with
chronic small vessel ischemic change. Cerebral ventricle sizes are
stable and concordant with the degree of cerebral volume loss.

Vascular: No hyperdense vessel or unexpected calcification.

Skull: No evidence of calvarial fracture.

Sinuses/Orbits: The visualized paranasal sinuses are essentially
clear.

Other:  The mastoid air cells are unopacified.
IMPRESSION: 1.  No evidence of acute intracranial abnormality.
2. Generalized cerebral volume loss and mild chronic small vessel
ischemia.
